# Patient Record
Sex: Male | Born: 1940 | Race: White | Hispanic: No | Marital: Married | Smoking: Never smoker
Health system: Southern US, Community
[De-identification: ages and names within clinical notes are randomized; demographics above are authoritative.]

## PROBLEM LIST (undated history)

## (undated) DIAGNOSIS — C099 Malignant neoplasm of tonsil, unspecified: Secondary | ICD-10-CM

## (undated) DIAGNOSIS — I10 Essential (primary) hypertension: Secondary | ICD-10-CM

## (undated) DIAGNOSIS — B009 Herpesviral infection, unspecified: Secondary | ICD-10-CM

## (undated) DIAGNOSIS — J189 Pneumonia, unspecified organism: Secondary | ICD-10-CM

## (undated) DIAGNOSIS — H9190 Unspecified hearing loss, unspecified ear: Secondary | ICD-10-CM

## (undated) DIAGNOSIS — E7800 Pure hypercholesterolemia, unspecified: Secondary | ICD-10-CM

## (undated) DIAGNOSIS — N529 Male erectile dysfunction, unspecified: Secondary | ICD-10-CM

## (undated) DIAGNOSIS — K219 Gastro-esophageal reflux disease without esophagitis: Secondary | ICD-10-CM

## (undated) DIAGNOSIS — IMO0002 Reserved for concepts with insufficient information to code with codable children: Secondary | ICD-10-CM

## (undated) DIAGNOSIS — IMO0001 Reserved for inherently not codable concepts without codable children: Secondary | ICD-10-CM

## (undated) HISTORY — DX: Herpesviral infection, unspecified: B00.9

## (undated) HISTORY — DX: Unspecified hearing loss, unspecified ear: H91.90

## (undated) HISTORY — DX: Essential (primary) hypertension: I10

## (undated) HISTORY — DX: Male erectile dysfunction, unspecified: N52.9

## (undated) HISTORY — DX: Pure hypercholesterolemia, unspecified: E78.00

## (undated) HISTORY — DX: Pneumonia, unspecified organism: J18.9

## (undated) HISTORY — DX: Malignant neoplasm of tonsil, unspecified: C09.9

## (undated) HISTORY — PX: OTHER SURGICAL HISTORY: SHX169

---

## 2007-04-09 ENCOUNTER — Ambulatory Visit (HOSPITAL_COMMUNITY): Admission: RE | Admit: 2007-04-09 | Discharge: 2007-04-09 | Payer: Self-pay | Admitting: Family Medicine

## 2009-01-18 ENCOUNTER — Emergency Department (HOSPITAL_COMMUNITY): Admission: EM | Admit: 2009-01-18 | Discharge: 2009-01-18 | Payer: Self-pay | Admitting: Emergency Medicine

## 2010-12-06 NOTE — Consult Note (Signed)
NAMEMarland Kitchen  LYON, DUMONT              ACCOUNT NO.:  [id number]   MEDICAL RECORD NO.:  [id number]          PATIENT TYPE:  EMS   LOCATION:  ED                            FACILITY:  [facility]   PHYSICIAN:  J. Darreld Mclean, M.D. DATE OF BIRTH:  [birth year]   DATE OF CONSULTATION:  DATE OF DISCHARGE:                                 CONSULTATION   The patient was seen at the request of the ER physician.   The patient is a 70 year old male who slipped and fell tonight while  working near his fish [place].  He landed on his left elbow.  He has got a  posterior dislocation without fracture of the left elbow.  Neurologically intact.  No other injuries.  No head injury.  No shoulder  injury.  No hip injury.  No back injury.  He has had good neurovascular  status to his arm but is very painful.  He has been seen by the ER  physician.  Please refer to the notes already taken by the nurse and the  ER physician here incorporated by reference, I have reviewed them.  His  vital signs are stable.   He has got obvious deformity to his left elbow.   The ER physician gave the patient etomidate, a short-acting anesthetic  agent IV.  Once the patient had the anesthetic agent on board and was  sedated, I then did a general closed reduction of his left elbow.  He is  put in a posterior splint.  I am awaiting x-rays now.  I have talked to  the patient's daughter who was present throughout the whole procedure.  I have recommended that he use ice to the elbow, given a prescription  for Vicodin, 5 samples would be given to go home tonight with him, and I  will see him in the office tomorrow afternoon.  Return to the emergency  room if any problem whatsoever.  I have gone over strict neurovascular  precautions with the daughter.  If any problem whatsoever, of his elbow,  hand, arm, back, or any other part of his body, he is to return to the  emergency room early tonight or early in the morning.  Otherwise, we  will see  him tomorrow afternoon.  Call if any difficulty healing.           ______________________________  J. Darreld Mclean, M.D.     JWK/MEDQ  D:  01/18/2009  T:  01/19/2009  Job:  [id number]

## 2011-10-25 ENCOUNTER — Other Ambulatory Visit: Payer: Self-pay | Admitting: Family Medicine

## 2011-10-25 DIAGNOSIS — K1370 Unspecified lesions of oral mucosa: Secondary | ICD-10-CM

## 2011-10-27 ENCOUNTER — Ambulatory Visit (HOSPITAL_COMMUNITY)
Admission: RE | Admit: 2011-10-27 | Discharge: 2011-10-27 | Disposition: A | Discharge status: Home / Self Care | Payer: Federal, State, Local not specified - PPO | Source: Ambulatory Visit | Attending: Family Medicine | Admitting: Family Medicine

## 2011-10-27 DIAGNOSIS — J392 Other diseases of pharynx: Secondary | ICD-10-CM | POA: Insufficient documentation

## 2011-10-27 DIAGNOSIS — M47812 Spondylosis without myelopathy or radiculopathy, cervical region: Secondary | ICD-10-CM | POA: Insufficient documentation

## 2011-10-27 DIAGNOSIS — C770 Secondary and unspecified malignant neoplasm of lymph nodes of head, face and neck: Secondary | ICD-10-CM | POA: Insufficient documentation

## 2011-10-27 DIAGNOSIS — K1370 Unspecified lesions of oral mucosa: Secondary | ICD-10-CM

## 2011-10-27 DIAGNOSIS — H68109 Unspecified obstruction of Eustachian tube, unspecified ear: Secondary | ICD-10-CM | POA: Insufficient documentation

## 2011-10-27 MED ORDER — GADOBENATE DIMEGLUMINE 529 MG/ML IV SOLN
15.0000 mL | Freq: Once | INTRAVENOUS | Status: AC | PRN
  Administered 2011-10-27: 15 mL via INTRAVENOUS

## 2011-11-02 ENCOUNTER — Ambulatory Visit (INDEPENDENT_AMBULATORY_CARE_PROVIDER_SITE_OTHER): Payer: Federal, State, Local not specified - PPO | Admitting: Otolaryngology

## 2011-11-16 ENCOUNTER — Encounter (HOSPITAL_COMMUNITY): Payer: Self-pay

## 2011-11-16 ENCOUNTER — Encounter (HOSPITAL_COMMUNITY): Payer: Federal, State, Local not specified - PPO | Attending: Oncology

## 2011-11-16 VITALS — BP 133/84 | HR 86 | Temp 98.0°F | Ht 67.0 in | Wt 162.2 lb

## 2011-11-16 DIAGNOSIS — E7800 Pure hypercholesterolemia, unspecified: Secondary | ICD-10-CM | POA: Insufficient documentation

## 2011-11-16 DIAGNOSIS — I10 Essential (primary) hypertension: Secondary | ICD-10-CM | POA: Insufficient documentation

## 2011-11-16 DIAGNOSIS — C099 Malignant neoplasm of tonsil, unspecified: Secondary | ICD-10-CM | POA: Insufficient documentation

## 2011-11-16 HISTORY — DX: Malignant neoplasm of tonsil, unspecified: C09.9

## 2011-11-16 MED ORDER — DEXAMETHASONE 4 MG PO TABS: ORAL_TABLET | ORAL | Status: DC

## 2011-11-16 MED ORDER — ONDANSETRON HCL 8 MG PO TABS: ORAL_TABLET | ORAL | Status: DC

## 2011-11-16 MED ORDER — PROCHLORPERAZINE MALEATE 10 MG PO TABS: 10.0000 mg | ORAL_TABLET | Freq: Four times a day (QID) | ORAL | Status: DC | PRN

## 2011-11-16 NOTE — Progress Notes (Signed)
CC:   Scott A. Gerda Diss, MD Newman Pies, MD  REFERRING PHYSICIAN:  Scott A. Gerda Diss, MD  IDENTIFYING STATEMENT:  Patient is a 71 year old man seen at request of Dr. Gerda Diss a head and neck tumor.  HISTORY OF PRESENT ILLNESS:  The patient presented to medical attention with a persistent sore throat that would not go away for 2-3 months' duration.  As he reports coughing mucus with blood, he presented to Dr. Gerda Diss and was placed on a course of antibiotics.  The discomfort got better but his symptoms of cough persisted.  On exam, the patient was also noted to have bilateral neck adenopathy.  He has also noted a fullness, especially in his left ear, and has noted somewhat limited hearing.  He presently denies pain.  He reports an intentional weight loss of 20 pounds over 5 months as a result of intensive exercise.  He denies shortness of breath.  He denies chest pain.  Dr. [name] had noted a right oropharyngeal mass.  The patient was subsequently referred for an MRI of the neck on 10/27/2011.  The MRI showed a 4.3 x 2.7 cm mass in the area of the tonsil extending superiorly into the right nasopharynx and crossing the midline to the left.  There was fluid in the right mastoid sinus due to obstruction of the eustachian tube. There was extensive cervical adenopathy with large necrotic nodes noted bilaterally measuring 2.5 x 1.8 cm on the right and 2.3 x 2.5 cm on left.  Multiple right jugular lymph nodes were also present.  Right retropharyngeal nodes were also involved with tumor.  The patient was referred to Dr. Newman Pies who performed a flexible fiberoptic laryngoscopy on 11/02/2011.  A large right tonsillar mass was noted eroding the soft palate and a large 2-3 cm mass was noted bilaterally at level 2.  Right middle ear effusion was noted secondary to right eustachian tube obstruction.  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  The patient is here with his wife and daughter, who is a Theatre stage manager, to discuss treatment options.  PAST MEDICAL HISTORY: 1. Hypertension. 2. Hypercholesterolemia.  ALLERGIES:  None.  MEDICATIONS:  Lipitor, lisinopril 40 mg daily, verapamil 100 mg q.h.s.  SOCIAL HISTORY:  The patient is married with 7 children and lives in [place].  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  He denies history of tobacco or alcohol use.  FAMILY HISTORY:  He denies a family history for oncologic or hematologic malignancies.  REVIEW OF SYSTEMS:  Denies fever, chills, night sweats, anorexia, but has had an intentional weight loss of 50 pounds over 4 months.  The patient states that this is a result of exercise.  He denies pain but does have some discomfort in his right neck area.  GI:  Denies nausea, vomiting, abdominal pain, diarrhea, melena, hematochezia.  GU:  Denies dysuria, hematuria, nocturia, frequency.  Cardiovascular:  Denies chest pain, PND, orthopnea, ankle swelling.  Respirations:  Denies cough, hemoptysis, wheeze, shortness of breath.  Skin:  No bruising or bleeding.  Neurologic:  Denies headache, vision changes, extremity weakness.  Rest of review of systems negative.  PHYSICAL EXAM:  General:  The patient is a well-appearing, well- nourished, man in no current distress.  Vitals:  Pulse 86, blood pressure 133/84, temperature 98, respirations 16, weight 162 pounds. HEENT:  Head is atraumatic, normocephalic.  Sclerae is anicteric. Pupils equal,  round, reactive to light.  Mouth moist without ulcerations.  There was a noted mass in the right oropharynx area of the tonsil with superficial ulceration extending to the midline.  Chest: Clear to percussion and auscultation.  CVS:  First and second heart sounds present with no added sounds or murmurs.  Abdomen:  Soft, nontender.  No hepatomegaly.  Bowel sounds present.   Extremities:  No calf tenderness.  Pulses present and symmetrical.  Lymph nodes: Palpable bilateral cervical adenopathy with an enlarged mass in the lateral area that was nontender and hard.  CNS:  No focal deficit. Plantars downgoing.  IMPRESSION AND PLAN:  Mr. 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  The patient will complete staging workup with a CT of the chest and a PET scan to get baseline.  He will also complete some baseline blood work with repeat calcium level.  We spent more than half the time discussing treatment options.  Despite his age, he has excellent performance status and walks 4 miles daily.  He has a passion for horses which is a hobby.  I recommended treating with a platin-based combination regimen with cisplatin dosed at 100 mg/sq m day 1 and 5-FU continuous infusion dosed at a 1000 mg/sq m daily from day 1- 4 with cetuximab given weekly.  The patient will also receive Neulasta support. We spent some time discussing logistics and side effects of therapy which were primarily cisplatin-induced nephrotoxicity and ototoxicity, thus a baseline audiogram is required prior to therapy.  Both drugs cause alopecia, nausea, vomiting, hand-foot syndrome, myelosuppression, mucositis, fatigue,and chest pain.  We also discussed EGFR-induced skin rash and diarrhea.  The patient was told that if he developed toxicity, I would consider lowering dose versus switching him to a less toxic regimen with carboplatin.  The patient will require port placement and thus he was referred to Surgery. The patient will also attend chemotherapy class and he was also given literature to review.  Treatment will potentially begin on Nov 27, 2011. Prescriptions were filled.  The patient had questions which were all answered.    ______________________________ Laurice Record, M.D. LIO/MEDQ  D:  11/16/2011  T:   11/16/2011  Job:  [id number]

## 2011-11-16 NOTE — Patient Instructions (Addendum)
Austin Mathis  [id number] 01-Jun-1941 Dr. Glenford Peers   [facility]  Discharge Instructions  RECOMMENDATIONS MADE BY THE CONSULTANT AND ANY TEST RESULTS WILL BE SENT TO YOUR REFERRING DOCTOR.   You were seen today by Dr. Dalene Carrow.  For your reference, with subsequent visits to the [facility] you will be seen by Dr. Mariel Sleet or Jenita Seashore, PA-C.  Patient to follow up as instructed: 1.  You are scheduled for Chemotherapy Teaching on May 1st at 0830 am.  Also on May 1st, you are scheduled to see Dr. Malvin Johns at 1030 am ([address], [place]). 2.  You will be receiving appointments for PET and CT scans. 3.  Your chemotherapy schedule is to follow.   I acknowledge that I have been informed and understand all the instructions given to me and received a copy. I do not have any more questions at this time, but understand that I may call the Specialty Clinic at [facility] at [phone] during business hours should I have any further questions or need assistance in obtaining follow-up care.    __________________________________________  _____________  __________ Signature of Patient or Authorized Representative            Date                   Time    __________________________________________ Nurse's Signature    Current Outpatient Prescriptions  Medication Sig Dispense Refill  . Atorvastatin Calcium (LIPITOR PO) Take by mouth.      Marland Kitchen lisinopril (PRINIVIL,ZESTRIL) 40 MG tablet Take 40 mg by mouth daily.      . verapamil (CALAN) 120 MG tablet Take 120 mg by mouth at bedtime.            April 2013  Sunday Monday Tuesday Wednesday Thursday Friday Saturday      1   2   3   4   5    MR MRA HEAD/ST NECK WO/W CM   3:00 PM  (45 min.)  [facility] 1  [facility] 6    7   8   9   10   11    NEW PATIENT 10   4:00 PM  (10 min.)  Darletta Moll, MD  DR. SU WOOI TEOH 12   13    14   15   16   17    18   19   20    21   22   23   24   25    NEW PATIENT 60  12:30 PM  (60 min.)  [facility] Covering Provider  [facility] 26   27    28   29   21 Dec 2011  Sunday Monday Tuesday Wednesday Thursday Friday Saturday            1   2   3   4    5   6   7   8   9   10   11    12   13   14   15   16   17   18    19   20   21   22   23   24   25    26   27    28  29 Happy Birthday!   30   31

## 2011-11-16 NOTE — Progress Notes (Signed)
This office note has been dictated.

## 2011-11-18 ENCOUNTER — Encounter (HOSPITAL_COMMUNITY): Payer: Self-pay | Admitting: *Deleted

## 2011-11-18 NOTE — Progress Notes (Signed)
Opened chart to review treatment plan and pre meds.

## 2011-11-20 ENCOUNTER — Ambulatory Visit (HOSPITAL_COMMUNITY)
Admission: RE | Admit: 2011-11-20 | Discharge: 2011-11-20 | Disposition: A | Discharge status: Home / Self Care | Payer: Federal, State, Local not specified - PPO | Source: Ambulatory Visit | Attending: Hematology and Oncology | Admitting: Hematology and Oncology

## 2011-11-20 DIAGNOSIS — R042 Hemoptysis: Secondary | ICD-10-CM | POA: Insufficient documentation

## 2011-11-20 DIAGNOSIS — R634 Abnormal weight loss: Secondary | ICD-10-CM | POA: Insufficient documentation

## 2011-11-20 DIAGNOSIS — C099 Malignant neoplasm of tonsil, unspecified: Secondary | ICD-10-CM | POA: Insufficient documentation

## 2011-11-20 MED ORDER — IOHEXOL 300 MG/ML  SOLN
80.0000 mL | Freq: Once | INTRAMUSCULAR | Status: AC | PRN
  Administered 2011-11-20: 80 mL via INTRAVENOUS

## 2011-11-20 NOTE — Patient Instructions (Addendum)
[facility]   CHEMOTHERAPY INSTRUCTIONS   POTENTIAL SIDE EFFECTS OF TREATMENT: Increased Susceptibility to Infection, Vomiting, Constipation, Hair Thinning, Changes in Character of Skin and Nails (brittleness, dryness,etc.), Pigment Changes (darkening of veins, nail beds, palms of hands, soles of feet, etc.), Bone Marrow Suppression, Abdominal Cramping and Urinary Frequency SPECIFIC CHEMO DRUG INFORMATION  Cisplatin - this medication is hard your kidneys. This is why we give you a large amount of fluids through your IV while you are here getting chemo. We also need you drinking 64 oz of fluid (preferably water/decaff fluids) 2 days prior to chemo and for up to 4-5 days after chemo. Drink more if you can. This will help keep your kidneys flushed. This can also cause acute and/or delayed nausea/vomiting. You must take your nausea meds as prescribed if you get nauseated. Do not wait until you start vomiting. This med can also cause peripheral neuropathy (numbness/tingling/burning in hands/fingers/feet/toes). Let us know if this develops so that we can monitor it and treat if necessary.  5FU: bone marrow suppression (low white blood cells - wbcs fight infection, low red blood cells - rbcs make up your blood, low platelets - this is what makes your blood clot, nausea/vomiting, diarrhea, mouth sores, hair loss, dry skin, ocular toxicities (increased tear production, sensitivity to light). You must wear sunscreen/sunglasses. Cover your skin when out in sunlight. You will get burned very easily.   Continuous Home Infusion Pump-- the 5 fu will be delivered over 96 hrs. Your treatment plan includes an infusion that will need to continue after you go home. We will hook you up to a small portable pump that fits into a "fanny pack". This pump will be set to infuse over a period of time specified by your doctor's orders. It can be anywhere from 24 to 96 hrs. You will be given a date  and time to return to our office to have this pump removed. You will be given an emergency contact # that you can call if the pump beeps or alarms.  The medication in this bag is a chemotherapy and should be handled with care. Do not allow others to handle this medication. You will be given a spill kit and instructed on how to use this in case of a spill.   Erbitux - this is a monoclonal antibody. This medication is derived partially from a mouse protein. Side Effects: infusion related reactions may include bronchospasm, fever, chills, shaking chills, itching, swelling around heart/blood vessels, low blood pressure, lung toxicity, acneform rash. It can also cause dry skin, fatigue, muscle aches, diarrhea, vomiting, no appetite, low white blood cell count, low magnesium, weight loss. You will also get a shot called neulasta after your chemo is finished. This shot is to help keep your white blood cells up to help you fight off infection. The shot may make your bones ache. You may use advil or aleve for this pain.  SELF IMAGE NEEDS AND REFERRALS MADE: We have a dietician available for nutritional referral. Chaplain available for spiritual concerns   EDUCATIONAL MATERIALS GIVEN AND REVIEWED: Specific Instructions Sheets for cisplatin, erbitux, and 5Fu   SELF CARE ACTIVITIES WHILE ON CHEMOTHERAPY: Increase your fluid intake 48 hours prior to treatment and drink at least 2 quarts per day after treatment., No alcohol intake., No aspirin or other medications unless approved by your oncologist., Eat foods that are light and easy to digest., Eat foods at cold or room temperature., No fried,  fatty, or spicy foods immediately before or after treatment., Have teeth cleaned professionally before starting treatment. Keep dentures and partial plates clean., Use soft toothbrush and do not use mouthwashes that contain alcohol. Biotene is a good mouthwash that is available at most pharmacies or may be ordered by calling  [phone]., Use warm salt water gargles (1 teaspoon salt per 1 quart warm water) before and after meals and at bedtime. Or you may rinse with 2 tablespoons of three -percent hydrogen peroxide mixed in eight ounces of water., Always use sunscreen with SPF (Sun Protection Factor) of 30 or higher., Use your nausea medication as directed to prevent nausea., Use your stool softener or laxative as directed to prevent constipation. and Use your anti-diarrheal medication as directed to stop diarrhea. Chemo Tips Please wash your hands for at least 30 seconds using warm soapy water. Handwashing is the #1 way to prevent the spread of germs. Stay away from sick people or people who are getting over a cold. If you develop respiratory systems such as green/yellow mucus production or productive cough or persistent cough let us know and we will see if you need an antibiotic. It is a good idea to keep a pair of gloves on when going into grocery stores/[employer] to decrease your risk of coming into contact with germs on the carts, etc. Carry alcohol hand gel with you at all times and use it frequently if out in public. All foods need to be cooked thoroughly. No raw foods. No medium or undercooked meats, eggs. If your food is cooked medium well, it does not need to be hot pink or saturated with bloody liquid at all. Vegetables and fruits need to be washed/rinsed under the faucet with a dish detergent before being consumed. You can eat raw fruits and vegetables unless we tell you otherwise but it would be best if you cooked them or bought frozen. Do not eat off of salad bars or hot bars unless you really trust the cleanliness of the restaurant. If you need dental work, please let Dr. Mariel Sleet know before you go for your appointment so that we can coordinate the best possible time for you in regards to your chemo regimen. You need to also let your dentist know that you are actively taking chemo. We may need to do labs prior to  your dental appointment. We also want your bowels moving at least every other day. If this is not happening, we need to know so that we can get you on a bowel regimen to help you go.      MEDICATIONS: You have been given prescriptions for the following medications: Dexamethasone -Take 2 tablets by mouth once a day on the day after chemotherapy and then take 2 tablets two times a day for 2 days. Take with food. Zofran or ondansetron  Take 1 tablet two times a day as needed for nausea or vomiting starting on the third day after chemotherapy. Compazine or prochlorperazine Take 1 tablet (10 mg total) by mouth every 6 (six) hours as needed (Nausea or vomiting).  Emla cream or lidocaine cream apply to port site one hour prior to access.  Follow directions on label   SYMPTOMS TO REPORT AS SOON AS POSSIBLE AFTER TREATMENT:  FEVER GREATER THAN 100.5 F  CHILLS WITH OR WITHOUT FEVER  NAUSEA AND VOMITING THAT IS NOT CONTROLLED WITH YOUR NAUSEA MEDICATION  UNUSUAL SHORTNESS OF BREATH  UNUSUAL BRUISING OR BLEEDING  TENDERNESS IN MOUTH AND THROAT WITH OR  WITHOUT PRESENCE OF ULCERS  URINARY PROBLEMS  BOWEL PROBLEMS  UNUSUAL RASH    Wear comfortable clothing and clothing appropriate for easy access to any Portacath or PICC line. Let us know if there is anything that we can do to make your therapy better!      I have been informed and understand all of the instructions given to me and have received a copy. I have been instructed to call the clinic [phone] or my family physician as soon as possible for continued medical care, if indicated. I do not have any more questions at this time but understand that I may call the Cancer Center or the Patient Navigator at [phone] during office hours should I have questions or need assistance in obtaining follow-up care.      _________________________________________      _______________     __________ Signature of Patient or  Authorized Representative        Date                            Time      _________________________________________ Nurse's Signature

## 2011-11-21 ENCOUNTER — Encounter (HOSPITAL_COMMUNITY): Payer: Self-pay | Admitting: Pharmacy Technician

## 2011-11-22 ENCOUNTER — Encounter (HOSPITAL_COMMUNITY): Payer: Federal, State, Local not specified - PPO | Attending: Oncology

## 2011-11-22 ENCOUNTER — Encounter (HOSPITAL_COMMUNITY): Payer: Self-pay

## 2011-11-22 ENCOUNTER — Encounter (HOSPITAL_COMMUNITY)
Admission: RE | Admit: 2011-11-22 | Discharge: 2011-11-22 | Disposition: A | Discharge status: Home / Self Care | Payer: Federal, State, Local not specified - PPO | Source: Ambulatory Visit | Attending: General Surgery | Admitting: General Surgery

## 2011-11-22 DIAGNOSIS — C099 Malignant neoplasm of tonsil, unspecified: Secondary | ICD-10-CM

## 2011-11-22 LAB — DIFFERENTIAL
Basophils Relative: 0 % (ref 0–1)
Eosinophils Absolute: 0.2 10*3/uL (ref 0.0–0.7)
Eosinophils Relative: 2 % (ref 0–5)
Monocytes Absolute: 0.6 10*3/uL (ref 0.1–1.0)
Monocytes Relative: 6 % (ref 3–12)

## 2011-11-22 LAB — SURGICAL PCR SCREEN
MRSA, PCR: NEGATIVE
Staphylococcus aureus: NEGATIVE

## 2011-11-22 LAB — CBC
HCT: 42.7 % (ref 39.0–52.0)
Hemoglobin: 14.4 g/dL (ref 13.0–17.0)
MCH: 31.4 pg (ref 26.0–34.0)
MCHC: 33.7 g/dL (ref 30.0–36.0)
MCV: 93.2 fL (ref 78.0–100.0)

## 2011-11-22 LAB — MAGNESIUM: Magnesium: 2.2 mg/dL (ref 1.5–2.5)

## 2011-11-22 LAB — COMPREHENSIVE METABOLIC PANEL
AST: 13 U/L (ref 0–37)
Albumin: 3.9 g/dL (ref 3.5–5.2)
Alkaline Phosphatase: 73 U/L (ref 39–117)
BUN: 13 mg/dL (ref 6–23)
Chloride: 96 mEq/L (ref 96–112)
Potassium: 4.3 mEq/L (ref 3.5–5.1)
Total Bilirubin: 0.3 mg/dL (ref 0.3–1.2)

## 2011-11-22 NOTE — Progress Notes (Signed)
11/22/11 1401  OBSTRUCTIVE SLEEP APNEA  Have you ever been diagnosed with sleep apnea through a sleep study? No  Do you snore loudly (loud enough to be heard through closed doors)?  1  Do you often feel tired, fatigued, or sleepy during the daytime? 0  Has anyone observed you stop breathing during your sleep? 0  Do you have, or are you being treated for high blood pressure? 1  BMI more than 35 kg/m2? 0  Age over 73 years old? 1  Neck circumference greater than 40 cm/18 inches? 0  Gender: 1  Obstructive Sleep Apnea Score 4   Score 4 or greater  Updated health history

## 2011-11-22 NOTE — Patient Instructions (Signed)
20 Austin Mathis  11/22/2011   Your procedure is scheduled on:  Friday, 11/24/11  Report to [facility] at 0700 AM.  Call this number if you have problems the morning of surgery: [phone]   Remember:   Do not eat food:After Midnight.  May have clear liquids:until Midnight .  Clear liquids include soda, tea, black coffee, apple or grape juice, broth.  Take these medicines the morning of surgery with A SIP OF WATER: verapamil, lisinopril and zofran   Do not wear jewelry, make-up or nail polish.  Do not wear lotions, powders, or perfumes. You may wear deodorant.  Do not shave 48 hours prior to surgery.  Do not bring valuables to the hospital.  Contacts, dentures or bridgework may not be worn into surgery.  Leave suitcase in the car. After surgery it may be brought to your room.  For patients admitted to the hospital, checkout time is 11:00 AM the day of discharge.   Patients discharged the day of surgery will not be allowed to drive home.  Name and phone number of your driver:wife  Special Instructions: CHG Shower Use Special Wash: 1/2 bottle night before surgery and 1/2 bottle morning of surgery.   Please read over the following fact sheets that you were given: Pain Booklet, MRSA Information, Surgical Site Infection Prevention, Anesthesia Post-op Instructions and Care and Recovery After Surgery   Implanted Port Instructions An implanted port is a central line that has a round shape and is placed under the skin. It is used for long-term IV (intravenous) access for:  Medicine.   Fluids.   Liquid nutrition, such as TPN (total parenteral nutrition).   Blood samples.  Ports can be placed:  In the chest area just below the collarbone (this is the most common place.)   In the arms.   In the belly (abdomen) area.   In the legs.  PARTS OF THE PORT A port has 2 main parts:  The reservoir. The reservoir is round, disc-shaped, and will be a small, raised area under your skin.   The  reservoir is the part where a needle is inserted (accessed) to either give medicines or to draw blood.   The catheter. The catheter is a long, slender tube that extends from the reservoir. The catheter is placed into a large vein.   Medicine that is inserted into the reservoir goes into the catheter and then into the vein.  INSERTION OF THE PORT  The port is surgically placed in either an operating room or in a procedural area (interventional radiology).   Medicine may be given to help you relax during the procedure.   The skin where the port will be inserted is numbed (local anesthetic).   1 or 2 small cuts (incisions) will be made in the skin to insert the port.   The port can be used after it has been inserted.  INCISION SITE CARE  The incision site may have small adhesive strips on it. This helps keep the incision site closed. Sometimes, no adhesive strips are placed. Instead of adhesive strips, a special kind of surgical glue is used to keep the incision closed.   If adhesive strips were placed on the incision sites, do not take them off. They will fall off on their own.   The incision site may be sore for 1 to 2 days. Pain medicine can help.   Do not get the incision site wet. Bathe or shower as directed by your caregiver.  The incision site should heal in 5 to 7 days. A small scar may form after the incision has healed.  ACCESSING THE PORT Special steps must be taken to access the port:  Before the port is accessed, a numbing cream can be placed on the skin. This helps numb the skin over the port site.   A sterile technique is used to access the port.   The port is accessed with a needle. Only "non-coring" port needles should be used to access the port. Once the port is accessed, a blood return should be checked. This helps ensure the port is in the vein and is not clogged (clotted).   If your caregiver believes your port should remain accessed, a clear (transparent)  bandage will be placed over the needle site. The bandage and needle will need to be changed every week or as directed by your caregiver.   Keep the bandage covering the needle clean and dry. Do not get it wet. Follow your caregiver's instructions on how to take a shower or bath when the port is accessed.   If your port does not need to stay accessed, no bandage is needed over the port.  FLUSHING THE PORT Flushing the port keeps it from getting clogged. How often the port is flushed depends on:  If a constant infusion is running. If a constant infusion is running, the port may not need to be flushed.   If intermittent medicines are given.   If the port is not being used.  For intermittent medicines:  The port will need to be flushed:   After medicines have been given.   After blood has been drawn.   As part of routine maintenance.   A port is normally flushed with:   Normal saline.   Heparin.   Follow your caregiver's advice on how often, how much, and the type of flush to use on your port.  IMPORTANT PORT INFORMATION  Tell your caregiver if you are allergic to heparin.   After your port is placed, you will get a manufacturer's information card. The card has information about your port. Keep this card with you at all times.   There are many types of ports available. Know what kind of port you have.   In case of an emergency, it may be helpful to wear a medical alert bracelet. This can help alert health care workers that you have a port.   The port can stay in for as long as your caregiver believes it is necessary.   When it is time for the port to come out, surgery will be done to remove it. The surgery will be similar to how the port was put in.   If you are in the hospital or clinic:   Your port will be taken care of and flushed by a nurse.   If you are at home:   A home health care nurse may give medicines and take care of the port.   You or a family member can  get special training and directions for giving medicine and taking care of the port at home.  SEEK IMMEDIATE MEDICAL CARE IF:   Your port does not flush or you are unable to get a blood return.   New drainage or pus is coming from the incision.   A bad smell is coming from the incision site.   You develop swelling or increased redness at the incision site.   You develop increased swelling  or pain at the port site.   You develop swelling or pain in the surrounding skin near the port.   You have an oral temperature above 102 F (38.9 C), not controlled by medicine.  MAKE SURE YOU:   Understand these instructions.   Will watch your condition.   Will get help right away if you are not doing well or get worse.  Document Released: 07/10/2005 Document Revised: 06/29/2011 Document Reviewed: 10/01/2008 [facility] Patient Information 2012 [place], [place].

## 2011-11-22 NOTE — Progress Notes (Signed)
Chemo teaching done, consents signed and nausea meds called to [facility].

## 2011-11-22 NOTE — Consult Note (Signed)
NAMECULLEN, Austin Mathis NO.:  [id number]  MEDICAL RECORD NO.:  [id number]  LOCATION:  DOIB                          FACILITY:  [facility]  PHYSICIAN:  Barbaraann Barthel, M.D. DATE OF BIRTH:  1941/04/04  DATE OF CONSULTATION: DATE OF DISCHARGE:                                CONSULTATION   No dictation for this job.     Barbaraann Barthel, M.D.     WB/MEDQ  D:  11/22/2011  T:  11/22/2011  Job:  [id number]

## 2011-11-22 NOTE — Consult Note (Signed)
Austin Mathis, DEMPSTER NO.:  [id number]  MEDICAL RECORD NO.:  [id number]  LOCATION:                                FACILITY:  [facility]  PHYSICIAN:  Barbaraann Barthel, M.D. DATE OF BIRTH:  01/19/1941  DATE OF CONSULTATION:  11/22/2011 DATE OF DISCHARGE:                                CONSULTATION   DIAGNOSIS:  Squamous cell carcinoma of the tonsils.  NOTE:  This is a 71 year old [place] male who developed cancer of the tonsils.  Interestingly, he has never smoked and has never been a drinker and leads a very healthy lifestyle.  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 Surgery was consulted for placement of Port-A-Cath.  PAST MEDICAL HISTORY:  Hypertension.  PAST SURGICAL HISTORY:  He has had no previous surgery.  ALLERGIES:  He has no known allergies.  Please consult records for his meds.  PHYSICAL EXAMINATION:  VITAL SIGNS:  He is a pleasant 5 foot 7 Latin- American male who weighs 162 pounds.  His temperature is 97.6, pulse rate 62, respirations 12, blood pressure 124/76. GENERAL:  He actually looks younger than his age. HEAD:  Normocephalic. EYES:  Extraocular movements are intact.  Pupils are round, react to light and accommodation.  The patient has diminished hearing on his right side particularly.  He cannot open his mouth entirely, but he has an obvious malignancy on his right tonsil pillar and he has significant bilateral cervical adenopathy. CHEST:  Clear. HEART:  Regular rhythm. ABDOMEN:  Soft. RECTAL:  Deferred. EXTREMITIES:  Within normal limits.  REVIEW OF SYSTEMS:  No history of migraines or seizures.  ENDOCRINE:  No history of diabetes or thyroid disease.  CARDIOVASCULAR:  The patient is a nonsmoker, nondrinker.  Has a history of hypertension. MUSCULOSKELETAL:  Grossly within normal limits.  Past GI: Unremarkable. He  has no history of unexplained weight loss, and he has had no colonoscopy.  GU: No history of dysuria or frequency or kidney stones.  LABORATORY DATA:  Pending.  PLAN:  We will plan for placement of a Port-A-Cath via the outpatient department.  This was discussed in detail with him discussing complications not limited to, but including bleeding, infection, thrombosis, catheter embolization, and pneumothorax.  Informed consent was obtained.  We will hopefully be able do this, this Friday so that he can receive his 1st treatment soon.     Barbaraann Barthel, M.D.     WB/MEDQ  D:  11/22/2011  T:  11/22/2011  Job:  [id number]  cc:   Ladona Horns. Mariel Sleet, MD Fax: [phone]

## 2011-11-23 ENCOUNTER — Other Ambulatory Visit (HOSPITAL_COMMUNITY): Payer: Self-pay | Admitting: Hematology and Oncology

## 2011-11-23 ENCOUNTER — Encounter (HOSPITAL_COMMUNITY)
Admission: RE | Admit: 2011-11-23 | Discharge: 2011-11-23 | Disposition: A | Discharge status: Home / Self Care | Payer: Federal, State, Local not specified - PPO | Source: Ambulatory Visit | Attending: Hematology and Oncology | Admitting: Hematology and Oncology

## 2011-11-23 DIAGNOSIS — C099 Malignant neoplasm of tonsil, unspecified: Secondary | ICD-10-CM

## 2011-11-23 DIAGNOSIS — C770 Secondary and unspecified malignant neoplasm of lymph nodes of head, face and neck: Secondary | ICD-10-CM | POA: Insufficient documentation

## 2011-11-23 LAB — GLUCOSE, CAPILLARY: Glucose-Capillary: 102 mg/dL — ABNORMAL HIGH (ref 70–99)

## 2011-11-23 MED ORDER — FLUDEOXYGLUCOSE F - 18 (FDG) INJECTION
14.9000 | Freq: Once | INTRAVENOUS | Status: AC | PRN
  Administered 2011-11-23: 14.9 via INTRAVENOUS

## 2011-11-24 ENCOUNTER — Encounter (HOSPITAL_COMMUNITY): Payer: Self-pay | Admitting: Anesthesiology

## 2011-11-24 ENCOUNTER — Ambulatory Visit (HOSPITAL_COMMUNITY): Payer: Federal, State, Local not specified - PPO

## 2011-11-24 ENCOUNTER — Encounter (HOSPITAL_COMMUNITY): Payer: Self-pay | Admitting: *Deleted

## 2011-11-24 ENCOUNTER — Ambulatory Visit (HOSPITAL_COMMUNITY)
Admission: RE | Admit: 2011-11-24 | Discharge: 2011-11-24 | Disposition: A | Discharge status: Home / Self Care | Payer: Federal, State, Local not specified - PPO | Source: Ambulatory Visit | Attending: General Surgery | Admitting: General Surgery

## 2011-11-24 ENCOUNTER — Telehealth (HOSPITAL_COMMUNITY): Payer: Self-pay | Admitting: Oncology

## 2011-11-24 ENCOUNTER — Ambulatory Visit (HOSPITAL_COMMUNITY): Payer: Federal, State, Local not specified - PPO | Admitting: Anesthesiology

## 2011-11-24 ENCOUNTER — Encounter (HOSPITAL_COMMUNITY)
Admission: RE | Disposition: A | Discharge status: Home / Self Care | Payer: Self-pay | Source: Ambulatory Visit | Attending: General Surgery

## 2011-11-24 DIAGNOSIS — C770 Secondary and unspecified malignant neoplasm of lymph nodes of head, face and neck: Secondary | ICD-10-CM | POA: Insufficient documentation

## 2011-11-24 DIAGNOSIS — Z79899 Other long term (current) drug therapy: Secondary | ICD-10-CM | POA: Insufficient documentation

## 2011-11-24 DIAGNOSIS — E7800 Pure hypercholesterolemia, unspecified: Secondary | ICD-10-CM

## 2011-11-24 DIAGNOSIS — C099 Malignant neoplasm of tonsil, unspecified: Secondary | ICD-10-CM | POA: Insufficient documentation

## 2011-11-24 DIAGNOSIS — Z01812 Encounter for preprocedural laboratory examination: Secondary | ICD-10-CM | POA: Insufficient documentation

## 2011-11-24 DIAGNOSIS — Z01810 Encounter for preprocedural cardiovascular examination: Secondary | ICD-10-CM | POA: Insufficient documentation

## 2011-11-24 DIAGNOSIS — I10 Essential (primary) hypertension: Secondary | ICD-10-CM

## 2011-11-24 HISTORY — PX: PORTACATH PLACEMENT: SHX2246

## 2011-11-24 SURGERY — INSERTION, TUNNELED CENTRAL VENOUS DEVICE, WITH PORT
Anesthesia: Monitor Anesthesia Care | Site: Chest | Laterality: Left | Wound class: Clean

## 2011-11-24 MED ORDER — LIDOCAINE HCL (PF) 1 % IJ SOLN
INTRAMUSCULAR | Status: AC
  Filled 2011-11-24: qty 5

## 2011-11-24 MED ORDER — LIDOCAINE HCL (PF) 1 % IJ SOLN
INTRAMUSCULAR | Status: DC | PRN
  Administered 2011-11-24: 50 mg

## 2011-11-24 MED ORDER — PROPOFOL 10 MG/ML IV EMUL
INTRAVENOUS | Status: AC
  Filled 2011-11-24: qty 20

## 2011-11-24 MED ORDER — MIDAZOLAM HCL 2 MG/2ML IJ SOLN
INTRAMUSCULAR | Status: AC
  Administered 2011-11-24: 2 mg via INTRAVENOUS
  Filled 2011-11-24: qty 2

## 2011-11-24 MED ORDER — HEPARIN SODIUM (PORCINE) 1000 UNIT/ML IJ SOLN
INTRAMUSCULAR | Status: AC
  Filled 2011-11-24: qty 1

## 2011-11-24 MED ORDER — LACTATED RINGERS IV SOLN
INTRAVENOUS | Status: DC
  Administered 2011-11-24: 08:00:00 via INTRAVENOUS

## 2011-11-24 MED ORDER — HEPARIN SOD (PORK) LOCK FLUSH 100 UNIT/ML IV SOLN
INTRAVENOUS | Status: AC
  Filled 2011-11-24: qty 5

## 2011-11-24 MED ORDER — BACITRACIN ZINC 500 UNIT/GM EX OINT
TOPICAL_OINTMENT | CUTANEOUS | Status: AC
  Filled 2011-11-24: qty 1.8

## 2011-11-24 MED ORDER — CEFAZOLIN SODIUM 1-5 GM-% IV SOLN: 1.0000 g | INTRAVENOUS | Status: DC

## 2011-11-24 MED ORDER — CEFAZOLIN SODIUM 1-5 GM-% IV SOLN
INTRAVENOUS | Status: AC
  Filled 2011-11-24: qty 50

## 2011-11-24 MED ORDER — CEFAZOLIN SODIUM 1-5 GM-% IV SOLN
INTRAVENOUS | Status: DC | PRN
  Administered 2011-11-24: 1 g via INTRAVENOUS

## 2011-11-24 MED ORDER — HEPARIN SOD (PORK) LOCK FLUSH 100 UNIT/ML IV SOLN
INTRAVENOUS | Status: DC | PRN
  Administered 2011-11-24: 3 [IU] via INTRAVENOUS

## 2011-11-24 MED ORDER — FENTANYL CITRATE 0.05 MG/ML IJ SOLN
INTRAMUSCULAR | Status: AC
  Filled 2011-11-24: qty 2

## 2011-11-24 MED ORDER — HEPARIN SODIUM (PORCINE) 1000 UNIT/ML IJ SOLN
INTRAMUSCULAR | Status: DC | PRN
  Administered 2011-11-24: 1000 [IU] via INTRAVENOUS

## 2011-11-24 MED ORDER — LIDOCAINE HCL (PF) 1 % IJ SOLN
INTRAMUSCULAR | Status: DC | PRN
  Administered 2011-11-24: 8 mL

## 2011-11-24 MED ORDER — FENTANYL CITRATE 0.05 MG/ML IJ SOLN: 25.0000 ug | INTRAMUSCULAR | Status: DC | PRN

## 2011-11-24 MED ORDER — SODIUM CHLORIDE 0.9 % IJ SOLN
INTRAMUSCULAR | Status: DC | PRN
  Administered 2011-11-24: 500 mL via INTRAVENOUS

## 2011-11-24 MED ORDER — ACETAMINOPHEN 325 MG PO TABS: 325.0000 mg | ORAL_TABLET | ORAL | Status: DC | PRN

## 2011-11-24 MED ORDER — LIDOCAINE HCL (PF) 1 % IJ SOLN
INTRAMUSCULAR | Status: AC
  Filled 2011-11-24: qty 30

## 2011-11-24 MED ORDER — GLYCOPYRROLATE 0.2 MG/ML IJ SOLN
0.2000 mg | Freq: Once | INTRAMUSCULAR | Status: AC
  Administered 2011-11-24: 0.2 mg via INTRAVENOUS

## 2011-11-24 MED ORDER — ONDANSETRON HCL 4 MG/2ML IJ SOLN: 4.0000 mg | Freq: Once | INTRAMUSCULAR | Status: DC | PRN

## 2011-11-24 MED ORDER — FENTANYL CITRATE 0.05 MG/ML IJ SOLN
INTRAMUSCULAR | Status: DC | PRN
  Administered 2011-11-24 (×3): 25 ug via INTRAVENOUS

## 2011-11-24 MED ORDER — MIDAZOLAM HCL 2 MG/2ML IJ SOLN
1.0000 mg | INTRAMUSCULAR | Status: DC | PRN
  Administered 2011-11-24 (×2): 2 mg via INTRAVENOUS

## 2011-11-24 MED ORDER — PROPOFOL 10 MG/ML IV EMUL
INTRAVENOUS | Status: DC | PRN
  Administered 2011-11-24: 50 ug/kg/min via INTRAVENOUS

## 2011-11-24 MED ORDER — BACITRACIN-NEOMYCIN-POLYMYXIN 400-5-5000 EX OINT
TOPICAL_OINTMENT | CUTANEOUS | Status: DC | PRN
  Administered 2011-11-24: 2 via TOPICAL

## 2011-11-24 MED ORDER — LACTATED RINGERS IV SOLN
INTRAVENOUS | Status: DC | PRN
  Administered 2011-11-24: 08:00:00 via INTRAVENOUS

## 2011-11-24 MED ORDER — GLYCOPYRROLATE 0.2 MG/ML IJ SOLN
INTRAMUSCULAR | Status: AC
  Administered 2011-11-24: 0.2 mg via INTRAVENOUS
  Filled 2011-11-24: qty 1

## 2011-11-24 SURGICAL SUPPLY — 46 items
APPLIER CLIP 9.375 SM OPEN (CLIP)
APR CLP SM 9.3 20 MLT OPN (CLIP)
BAG DECANTER FOR FLEXI CONT (MISCELLANEOUS) ×2 IMPLANT
BAG HAMPER (MISCELLANEOUS) ×2 IMPLANT
CLIP APPLIE 9.375 SM OPEN (CLIP) IMPLANT
CLOTH BEACON ORANGE TIMEOUT ST (SAFETY) ×2 IMPLANT
COVER SURGICAL LIGHT HANDLE (MISCELLANEOUS) ×4 IMPLANT
DECANTER SPIKE VIAL GLASS SM (MISCELLANEOUS) ×2 IMPLANT
DRAPE C-ARM FOLDED MOBILE STRL (DRAPES) ×2 IMPLANT
DRSG TEGADERM 2-3/8X2-3/4 SM (GAUZE/BANDAGES/DRESSINGS) ×2 IMPLANT
ELECT REM PT RETURN 9FT ADLT (ELECTROSURGICAL) ×2
ELECTRODE REM PT RTRN 9FT ADLT (ELECTROSURGICAL) ×1 IMPLANT
GLOVE BIOGEL PI IND STRL 7.5 (GLOVE) IMPLANT
GLOVE BIOGEL PI INDICATOR 7.5 (GLOVE) ×1
GLOVE ECLIPSE 7.0 STRL STRAW (GLOVE) ×1 IMPLANT
GLOVE SKINSENSE NS SZ7.0 (GLOVE) ×1
GLOVE SKINSENSE STRL SZ7.0 (GLOVE) ×1 IMPLANT
GOWN STRL REIN XL XLG (GOWN DISPOSABLE) ×4 IMPLANT
IV NS 500ML (IV SOLUTION) ×2
IV NS 500ML BAXH (IV SOLUTION) ×1 IMPLANT
KIT PORT POWER 8FR ISP MRI (CATHETERS) ×2 IMPLANT
KIT ROOM TURNOVER APOR (KITS) ×2 IMPLANT
MANIFOLD NEPTUNE II (INSTRUMENTS) ×2 IMPLANT
NDL HYPO 18GX1.5 BLUNT FILL (NEEDLE) ×1 IMPLANT
NDL HYPO 25X1 1.5 SAFETY (NEEDLE) ×1 IMPLANT
NEEDLE HYPO 18GX1.5 BLUNT FILL (NEEDLE) ×2 IMPLANT
NEEDLE HYPO 25X1 1.5 SAFETY (NEEDLE) ×2 IMPLANT
PACK MINOR (CUSTOM PROCEDURE TRAY) ×2 IMPLANT
PAD ARMBOARD 7.5X6 YLW CONV (MISCELLANEOUS) ×2 IMPLANT
SET BASIN LINEN APH (SET/KITS/TRAYS/PACK) ×2 IMPLANT
SHEATH COOK PEEL AWAY SET 8F (SHEATH) IMPLANT
SOL PREP PROV IODINE SCRUB 4OZ (MISCELLANEOUS) ×2 IMPLANT
SPONGE GAUZE 2X2 8PLY STRL LF (GAUZE/BANDAGES/DRESSINGS) ×2 IMPLANT
SPONGE LAP 18X18 X RAY DECT (DISPOSABLE) ×1 IMPLANT
STRIP CLOSURE SKIN 1/2X4 (GAUZE/BANDAGES/DRESSINGS) ×1 IMPLANT
STRIP CLOSURE SKIN 1/4X3 (GAUZE/BANDAGES/DRESSINGS) ×2 IMPLANT
SUT VIC AB 4-0 SH 27 (SUTURE) ×4
SUT VIC AB 4-0 SH 27XBRD (SUTURE) ×1 IMPLANT
SUT VIC AB 5-0 P-3 18X BRD (SUTURE) ×1 IMPLANT
SUT VIC AB 5-0 P3 18 (SUTURE) ×2
SYR 20CC LL (SYRINGE) ×2 IMPLANT
SYR 5ML LL (SYRINGE) ×4 IMPLANT
SYR BULB IRRIGATION 50ML (SYRINGE) ×2 IMPLANT
SYR CONTROL 10ML LL (SYRINGE) ×2 IMPLANT
TOWEL OR 17X26 4PK STRL BLUE (TOWEL DISPOSABLE) ×2 IMPLANT
WATER STERILE IRR 1000ML POUR (IV SOLUTION) ×4 IMPLANT

## 2011-11-24 NOTE — Progress Notes (Signed)
Post OP Check  Wound clean and dry.    Filed Vitals:   11/24/11 1115  BP: 129/83  Pulse: 81  Temp:   Resp: 14  O2 Sat. 98%  Post op chest x-ray shows tip of catheter in SVC no pneumothorax.  Pt will follow up with oncology on Monday and with me later on some day.  Did well and discharged home.

## 2011-11-24 NOTE — Telephone Encounter (Signed)
[employer]- [phone] ? AUTH IS NEEDED FOR ERBITUX [id number]* 5FU [id number]* CISPLATIN 9060 AND NEULASTA 2505. CSR-TIM H NO AUTH IS REQUIRED FOR CHEMO DRUGS LISTED ABOVE. REF# [id number]

## 2011-11-24 NOTE — Anesthesia Preprocedure Evaluation (Signed)
Anesthesia Evaluation  Patient identified by MRN, date of birth, ID band Patient awake    Reviewed: Allergy & Precautions, H&P , NPO status , Patient's Chart, lab work & pertinent test results  Airway Mallampati: IV TM Distance: >3 FB Neck ROM: Limited  Mouth opening: Limited Mouth Opening  Dental  (+) Teeth Intact   Pulmonary sleep apnea ,    Pulmonary exam normal       Cardiovascular hypertension, Pt. on medications Rhythm:Regular Rate:Normal     Neuro/Psych negative neurological ROS  negative psych ROS   GI/Hepatic negative GI ROS, Neg liver ROS,   Endo/Other  negative endocrine ROS  Renal/GU negative Renal ROS     Musculoskeletal negative musculoskeletal ROS (+)   Abdominal Normal abdominal exam  (+)   Peds  Hematology negative hematology ROS (+)   Anesthesia Other Findings   Reproductive/Obstetrics                           Anesthesia Physical Anesthesia Plan  ASA: II  Anesthesia Plan: MAC   Post-op Pain Management:    Induction: Intravenous  Airway Management Planned: Simple Face Mask  Additional Equipment:   Intra-op Plan:   Post-operative Plan:   Informed Consent: I have reviewed the patients History and Physical, chart, labs and discussed the procedure including the risks, benefits and alternatives for the proposed anesthesia with the patient or authorized representative who has indicated his/her understanding and acceptance.     Plan Discussed with: CRNA  Anesthesia Plan Comments: (Moderate IV sedation)        Anesthesia Quick Evaluation

## 2011-11-24 NOTE — Anesthesia Postprocedure Evaluation (Signed)
  Anesthesia Post-op Note  Patient: Austin Mathis  Procedure(s) Performed: Procedure(s) (LRB): INSERTION PORT-A-CATH (Left)  Patient Location: PACU  Anesthesia Type: MAC  Level of Consciousness: awake, alert , oriented and patient cooperative  Airway and Oxygen Therapy: Patient Spontanous Breathing  Post-op Pain: Mild  Post-op Assessment: Post-op Vital signs reviewed, Patient's Cardiovascular Status Stable, Respiratory Function Stable and Patent Airway  Post-op Vital Signs: Reviewed and stable  Complications: No apparent anesthesia complications

## 2011-11-24 NOTE — Preoperative (Signed)
Beta Blockers   Reason not to administer Beta Blockers:Not Applicable 

## 2011-11-24 NOTE — Progress Notes (Signed)
70 yr. Old Latin American Male with CA of the left tonsil with mets to the cervical nodes, for placement of porta cath for chemotherapy.  Procedure and risks explained in Spanish and [place] and informed consent obtained.  Labs reviewed.  No change clinically in H&P since it was done.  Dict. # [id number].  Filed Vitals:   11/24/11 0925  BP: 157/91  Pulse:   Temp:   Resp: 14  temp 97.7 pulse 74/min.

## 2011-11-24 NOTE — Progress Notes (Signed)
Awake. Ginger-ale given to drink. Tolerated well. 

## 2011-11-24 NOTE — Brief Op Note (Signed)
11/24/2011  11:13 AM  PATIENT:  Austin Mathis  71 y.o. male  PRE-OPERATIVE DIAGNOSIS:  cancer tonsil  POST-OPERATIVE DIAGNOSIS:  cancer tonsil  PROCEDURE:  Procedure(s) (LRB): INSERTION PORT-A-CATH (Left)  SURGEON:  Surgeon(s) and Role:    * Marlane Hatcher, MD - Primary  PHYSICIAN ASSISTANT:   ASSISTANTS: none   ANESTHESIA:   IV sedation  EBL:  Total I/O In: 700 [I.V.:700] Out: 30 [Blood:30]  BLOOD ADMINISTERED:none  DRAINS: none   LOCAL MEDICATIONS USED:  LIDOCAINE 5 cc  SPECIMEN:  No Specimen  DISPOSITION OF SPECIMEN:  N/A  COUNTS:  YES  TOURNIQUET:  * No tourniquets in log *  DICTATION: .Other Dictation: Dictation Number OR dict.# [id number].  PLAN OF CARE: Discharge to home after PACU  PATIENT DISPOSITION:  PACU - hemodynamically stable.   Delay start of Pharmacological VTE agent (>24hrs) due to surgical blood loss or risk of bleeding: yes

## 2011-11-24 NOTE — Transfer of Care (Signed)
Immediate Anesthesia Transfer of Care Note  Patient: Austin Mathis  Procedure(s) Performed: Procedure(s) (LRB): INSERTION PORT-A-CATH (Left)  Patient Location: PACU  Anesthesia Type: MAC  Level of Consciousness: awake, alert , oriented and patient cooperative  Airway & Oxygen Therapy: Patient Spontanous Breathing  Post-op Assessment: Report given to PACU RN and Post -op Vital signs reviewed and stable  Post vital signs: Reviewed and stable  Complications: No apparent anesthesia complications

## 2011-11-24 NOTE — Op Note (Signed)
NAMEMarland Mathis  WHITTEN, ANDREONI NO.:  [id number]  MEDICAL RECORD NO.:  [id number]  LOCATION:  APPO                          FACILITY:  [facility]  PHYSICIAN:  Barbaraann Barthel, M.D. DATE OF BIRTH:  1940/11/25  DATE OF PROCEDURE:  11/24/2011 DATE OF DISCHARGE:  11/24/2011                              OPERATIVE REPORT   SURGEON:  Barbaraann Barthel, MD  DIAGNOSES:  Squamous cell carcinoma of the tonsil, metastatic.  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  PROCEDURE:  Placement of left subclavian Port-A-Cath under fluoro.  SPECIMEN:  None.  CASE CLASSIFICATION:  Clean.  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  He was referred for a placement of a Port-A- Cath.  We discussed complications in English and Spanish with the procedure discussing, not limited to bleeding, infection, thrombosis, possibility of catheter embolization, and pneumothorax.  Informed consent was obtained.  PROCEDURE:  The patient was placed in Trendelenburg after prepping with Betadine solution and draping in the usual manner.  It was difficult to cannulate the left subclavian vein because he had a clavicle that made very sharp angle back towards the shoulder.  However, we were able to cannulate this finally and under fluoro, this was confirmed in good position.  We used the Seldinger technique with a guidewire through an 18-gauge needle and then, we placed the Silastic catheter after the venous dilator catheter was placed.  The Silastic catheter was left in the superior vena cava.  Then, we connected it to its infusion device, which had been flushed with a heparinized saline solution, then aspirating it, and then flushing it again and confirming its position. We placed this in the subcutaneous pocket, which we created using 1% Xylocaine and then with blunt dissection, placed the infusion device in this pocket.  We  then closed the incision using 4-0 and 5-0 Polysorb and 0.25-inch Steri-Strips with bacitracin, 2x2s, and an OpSite dressing was applied.  Prior to closure, all sponge, needle, and instrument counts were found to be correct.  Estimated blood loss was maybe 30 mL.  The patient received about 500 mL.  No drains were placed.  There were no complications.  Note, we will verify the catheter position and chest x-ray to rule out pneumothorax in the recovery room.     Barbaraann Barthel, M.D.     WB/MEDQ  D:  11/24/2011  T:  11/24/2011  Job:  [id number]  cc:   Ladona Horns. Mariel Sleet, MD Fax: [phone]

## 2011-11-24 NOTE — Discharge Instructions (Signed)
Implanted Port Instructions An implanted port is a central line that has a round shape and is placed under the skin. It is used for long-term IV (intravenous) access for:  Medicine.   Fluids.   Liquid nutrition, such as TPN (total parenteral nutrition).   Blood samples.  Ports can be placed:  In the chest area just below the collarbone (this is the most common place.)   In the arms.   In the belly (abdomen) area.   In the legs.  PARTS OF THE PORT A port has 2 main parts:  The reservoir. The reservoir is round, disc-shaped, and will be a small, raised area under your skin.   The reservoir is the part where a needle is inserted (accessed) to either give medicines or to draw blood.   The catheter. The catheter is a long, slender tube that extends from the reservoir. The catheter is placed into a large vein.   Medicine that is inserted into the reservoir goes into the catheter and then into the vein.  INSERTION OF THE PORT  The port is surgically placed in either an operating room or in a procedural area (interventional radiology).   Medicine may be given to help you relax during the procedure.   The skin where the port will be inserted is numbed (local anesthetic).   1 or 2 small cuts (incisions) will be made in the skin to insert the port.   The port can be used after it has been inserted.  INCISION SITE CARE  The incision site may have small adhesive strips on it. This helps keep the incision site closed. Sometimes, no adhesive strips are placed. Instead of adhesive strips, a special kind of surgical glue is used to keep the incision closed.   If adhesive strips were placed on the incision sites, do not take them off. They will fall off on their own.   The incision site may be sore for 1 to 2 days. Pain medicine can help.   Do not get the incision site wet. Bathe or shower as directed by your caregiver.   The incision site should heal in 5 to 7 days. A small scar may  form after the incision has healed.  ACCESSING THE PORT Special steps must be taken to access the port:  Before the port is accessed, a numbing cream can be placed on the skin. This helps numb the skin over the port site.   A sterile technique is used to access the port.   The port is accessed with a needle. Only "non-coring" port needles should be used to access the port. Once the port is accessed, a blood return should be checked. This helps ensure the port is in the vein and is not clogged (clotted).   If your caregiver believes your port should remain accessed, a clear (transparent) bandage will be placed over the needle site. The bandage and needle will need to be changed every week or as directed by your caregiver.   Keep the bandage covering the needle clean and dry. Do not get it wet. Follow your caregiver's instructions on how to take a shower or bath when the port is accessed.   If your port does not need to stay accessed, no bandage is needed over the port.  FLUSHING THE PORT Flushing the port keeps it from getting clogged. How often the port is flushed depends on:  If a constant infusion is running. If a constant infusion is running, the   port may not need to be flushed.   If intermittent medicines are given.   If the port is not being used.  For intermittent medicines:  The port will need to be flushed:   After medicines have been given.   After blood has been drawn.   As part of routine maintenance.   A port is normally flushed with:   Normal saline.   Heparin.   Follow your caregiver's advice on how often, how much, and the type of flush to use on your port.  IMPORTANT PORT INFORMATION  Tell your caregiver if you are allergic to heparin.   After your port is placed, you will get a manufacturer's information card. The card has information about your port. Keep this card with you at all times.   There are many types of ports available. Know what kind of port  you have.   In case of an emergency, it may be helpful to wear a medical alert bracelet. This can help alert health care workers that you have a port.   The port can stay in for as long as your caregiver believes it is necessary.   When it is time for the port to come out, surgery will be done to remove it. The surgery will be similar to how the port was put in.   If you are in the hospital or clinic:   Your port will be taken care of and flushed by a nurse.   If you are at home:   A home health care nurse may give medicines and take care of the port.   You or a family member can get special training and directions for giving medicine and taking care of the port at home.  SEEK IMMEDIATE MEDICAL CARE IF:   Your port does not flush or you are unable to get a blood return.   New drainage or pus is coming from the incision.   A bad smell is coming from the incision site.   You develop swelling or increased redness at the incision site.   You develop increased swelling or pain at the port site.   You develop swelling or pain in the surrounding skin near the port.   You have an oral temperature above 102 F (38.9 C), not controlled by medicine.  MAKE SURE YOU:   Understand these instructions.   Will watch your condition.   Will get help right away if you are not doing well or get worse.  Document Released: 07/10/2005 Document Revised: 06/29/2011 Document Reviewed: 10/01/2008 [facility] Patient Information 2012 [place], [place].  PATIENT INSTRUCTIONS POST-ANESTHESIA  IMMEDIATELY FOLLOWING SURGERY:  Do not drive or operate machinery for the first twenty four hours after surgery.  Do not make any important decisions for twenty four hours after surgery or while taking narcotic pain medications or sedatives.  If you develop intractable nausea and vomiting or a severe headache please notify your doctor immediately.  FOLLOW-UP:  Please make an appointment with your surgeon as  instructed. You do not need to follow up with anesthesia unless specifically instructed to do so.  WOUND CARE INSTRUCTIONS (if applicable):  Keep a dry clean dressing on the anesthesia/puncture wound site if there is drainage.  Once the wound has quit draining you may leave it open to air.  Generally you should leave the bandage intact for twenty four hours unless there is drainage.  If the epidural site drains for more than 36-48 hours please call the anesthesia  department.  QUESTIONS?:  Please feel free to call your physician or the hospital operator if you have any questions, and they will be happy to assist you.     [facility] Anesthesia Department [address] [place] [phone]

## 2011-11-27 ENCOUNTER — Other Ambulatory Visit (HOSPITAL_COMMUNITY): Payer: Self-pay | Admitting: Oncology

## 2011-11-27 ENCOUNTER — Encounter (HOSPITAL_BASED_OUTPATIENT_CLINIC_OR_DEPARTMENT_OTHER): Payer: Federal, State, Local not specified - PPO

## 2011-11-27 VITALS — BP 136/82 | HR 94 | Temp 98.5°F | Ht 67.0 in | Wt 161.4 lb

## 2011-11-27 DIAGNOSIS — Z5112 Encounter for antineoplastic immunotherapy: Secondary | ICD-10-CM

## 2011-11-27 DIAGNOSIS — Z5111 Encounter for antineoplastic chemotherapy: Secondary | ICD-10-CM

## 2011-11-27 DIAGNOSIS — C099 Malignant neoplasm of tonsil, unspecified: Secondary | ICD-10-CM

## 2011-11-27 MED ORDER — PALONOSETRON HCL INJECTION 0.25 MG/5ML
0.2500 mg | Freq: Once | INTRAVENOUS | Status: AC
  Administered 2011-11-27: 0.25 mg via INTRAVENOUS

## 2011-11-27 MED ORDER — SODIUM CHLORIDE 0.9 % IV SOLN
Freq: Once | INTRAVENOUS | Status: AC
  Administered 2011-11-27: 10:00:00 via INTRAVENOUS

## 2011-11-27 MED ORDER — SODIUM CHLORIDE 0.9 % IJ SOLN
INTRAMUSCULAR | Status: AC
  Filled 2011-11-27: qty 10

## 2011-11-27 MED ORDER — SODIUM CHLORIDE 0.9 % IJ SOLN
10.0000 mL | INTRAMUSCULAR | Status: DC | PRN
  Filled 2011-11-27: qty 10

## 2011-11-27 MED ORDER — DEXAMETHASONE SODIUM PHOSPHATE 4 MG/ML IJ SOLN: 12.0000 mg | Freq: Once | INTRAMUSCULAR | Status: DC

## 2011-11-27 MED ORDER — SODIUM CHLORIDE 0.9 % IV SOLN
1000.0000 mg/m2/d | INTRAVENOUS | Status: DC
  Administered 2011-11-27: 7500 mg via INTRAVENOUS
  Filled 2011-11-27: qty 150

## 2011-11-27 MED ORDER — SODIUM CHLORIDE 0.9 % IV SOLN: 150.0000 mg | Freq: Once | INTRAVENOUS | Status: DC

## 2011-11-27 MED ORDER — SODIUM CHLORIDE 0.9 % IV SOLN
100.0000 mg/m2 | Freq: Once | INTRAVENOUS | Status: AC
  Administered 2011-11-27: 187 mg via INTRAVENOUS
  Filled 2011-11-27: qty 187

## 2011-11-27 MED ORDER — FOSAPREPITANT DIMEGLUMINE INJECTION 150 MG
Freq: Once | INTRAVENOUS | Status: AC
  Administered 2011-11-27: 14:00:00 via INTRAVENOUS
  Filled 2011-11-27: qty 5

## 2011-11-27 MED ORDER — PALONOSETRON HCL INJECTION 0.25 MG/5ML
INTRAVENOUS | Status: AC
  Filled 2011-11-27: qty 5

## 2011-11-27 MED ORDER — CETUXIMAB CHEMO IV INJECTION 200 MG/100ML
400.0000 mg/m2 | Freq: Once | INTRAVENOUS | Status: AC
  Administered 2011-11-27: 700 mg via INTRAVENOUS
  Filled 2011-11-27: qty 350

## 2011-11-27 MED ORDER — DIPHENHYDRAMINE HCL 50 MG/ML IJ SOLN
INTRAMUSCULAR | Status: AC
  Filled 2011-11-27: qty 1

## 2011-11-27 MED ORDER — DIPHENHYDRAMINE HCL 50 MG/ML IJ SOLN
50.0000 mg | Freq: Once | INTRAMUSCULAR | Status: AC
  Administered 2011-11-27: 50 mg via INTRAVENOUS

## 2011-11-27 NOTE — Progress Notes (Signed)
At 1248 Pt c/o "cold" slight chills also present. vss and denies SOB or chest pain. Erbitux stopped for 10 min. Blankets given and symptoms relieved. erbitux restarted at 1256. Tolerated remainder of treatment without problems

## 2011-11-28 ENCOUNTER — Telehealth (HOSPITAL_COMMUNITY): Payer: Self-pay | Admitting: *Deleted

## 2011-11-28 ENCOUNTER — Encounter: Payer: Self-pay | Admitting: Radiation Oncology

## 2011-11-28 NOTE — Progress Notes (Signed)
Austin Mathis is 71 years old and seen initially by Dr. Dalene Carrow for bilateral neck adenopathy from a primary in the tonsil on the best 4.3 x 2.7 cm in size. 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 Dalene Carrow to start systemic chemotherapy. The family is under the impression that he does not have a treatable disease by radiation therapy and does not have curative therapy potentially. So I went over his films today examined his neck area which shows bilateral adenopathy on the left he has at least a 3 x 4 cm conglomeration of lymph nodes on the right he has a 3 x 3 cm lymph node mass in the mid cervical chains.  I talked to Dr. Arnette Schaumann and he and I both feel that he is potentially curative individual in spite of his stage IV disease. I therefore set up a consultation with Dr. Roselind Messier and what we plan to do is give him 2 cycles of this pre-radiation and chemotherapy to reduce the size of tumor and then move on with a consultation with radiation therapy and a dental oncology with Dr. Kristin Bruins. During the radiation therapy we will give him cisplatin chemotherapy.

## 2011-11-28 NOTE — Progress Notes (Signed)
Addended by: Glenford Peers S on: 11/28/2011 04:25 PM   Modules accepted: Level of Service

## 2011-11-28 NOTE — Telephone Encounter (Signed)
Pt tolerated chemo well. States he is swallowing better and some of his congestion is gone.

## 2011-11-28 NOTE — Progress Notes (Addendum)
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 .     NEVER SMOKED NO ALCOHOL USE    ALLERGIES...Marland KitchenMarland KitchenNKA  .................................................................                  ..Marland KitchenMarland KitchenMarland KitchenMarland Kitchen

## 2011-11-29 ENCOUNTER — Ambulatory Visit
Admission: RE | Admit: 2011-11-29 | Discharge: 2011-11-29 | Disposition: A | Discharge status: Home / Self Care | Payer: Federal, State, Local not specified - PPO | Source: Ambulatory Visit | Attending: Radiation Oncology | Admitting: Radiation Oncology

## 2011-11-29 ENCOUNTER — Telehealth (HOSPITAL_COMMUNITY): Payer: Self-pay | Admitting: *Deleted

## 2011-11-29 ENCOUNTER — Encounter: Payer: Self-pay | Admitting: Radiation Oncology

## 2011-11-29 VITALS — BP 135/77 | HR 80 | Temp 97.7°F | Resp 18 | Wt 174.3 lb

## 2011-11-29 DIAGNOSIS — Z510 Encounter for antineoplastic radiation therapy: Secondary | ICD-10-CM | POA: Insufficient documentation

## 2011-11-29 DIAGNOSIS — C099 Malignant neoplasm of tonsil, unspecified: Secondary | ICD-10-CM | POA: Insufficient documentation

## 2011-11-29 NOTE — Progress Notes (Signed)
HERE TODAY FOR CONSULT OF METASTATIC CERVICAL LYMPH NODES, HISTORY OF TONSILLAR.  CHEMO AT [facility].   GETTING CHEMO NOW, 5FU .  NO N/V OR DIARRHEA.   NO PAIN.  SWALLOWING OK, BEFORE CHEMO HAD DIFFICULTY SWALLOWING, BUT BETTER NOW SINCE CHEMO STARTED.  ABLE TO SWALLOW SOLID FOODS.  CAN'T OPEN MOUTH AS WIDE AS NORMALLY COULD.  TUMOR IN MOUTH , IN HARD PALATE TO THE RIGHT.  HE THINKS IT IS SMALLER SINCE CHEMO STARTED.  HAS HICCOUGHS

## 2011-11-29 NOTE — Telephone Encounter (Signed)
Pt has not had BM in 2 days and he is going to take MOM 2-4 tbsp tonight if no BM before then. Otherwise doing well. States he is taking in 2400-3600 cc's of fluids daily.

## 2011-11-29 NOTE — Progress Notes (Signed)
Radiation Oncology         [phone] ________________________________  Initial outpatient Consultation  Name: Austin Mathis MRN: [id number]  Date: 11/29/2011  DOB: 01-25-1941  WU:[id number],YNWGN, MD, MD  , Austin Mathis, Cindra Eves    REFERRING PHYSICIAN: Mariel Mathis, Austin Mathis  DIAGNOSIS: The encounter diagnosis was Cancer of tonsil.  HISTORY OF PRESENT ILLNESS::Austin Mathis is a 71 y.o. male who is seen out of the courtesy of Dr. Mariel Mathis  for an opinion concerning radiation therapy as part of the management of the patient's advanced oral pharyngeal carcinoma.  Mr. Austin Mathis. fret and normally lives in the [place] area. Several months ago he began experiencing some difficulties with swallowing and some pain associated with swallowing. He also developed worsening right hearing loss. At the encouragement of daughter and wife the patient presented to the [facility] area for further evaluation.  Patient was seen by Dr. Gerda Mathis and a MRI of the neck was performed. This demonstrated a an advanced oropharyngeal carcinoma with bilateral neck adenopathy. Details of the study are as below.. The patient was seen by Dr. Suszanne Mathis and a subsequent biopsy of the right tonsillar area revealed invasive squamous cell carcinoma. The tumor was strongly positive for p16 immunohistochemical staining.Marland Kitchen He was seen by Dr. Dalene Mathis and a chest CT scan was performed which showed no evidence of chest involvement. A subsequent PET scan was performed which is detailed below but showed bilateral cervical adenopathy as well as extensive uptake in the oropharyngeal mass. Patient was also started on cis-platinum and 5-FU for his stage IV tonsillar carcinoma. Dr. Mariel Mathis has taken over the patient's care at [facility] and he did feel that the radiation oncology consultation would be of benefit to the patient.   PREVIOUS RADIATION THERAPY: No  PAST MEDICAL HISTORY:  has a past medical history of Hypertension; High cholesterol;  and Tonsillar cancer.    PAST SURGICAL HISTORY: Past Surgical History  Procedure Date  . Placement of port a cath   . Portacath placement 11/24/2011    Procedure: INSERTION PORT-A-CATH;  Surgeon: Austin Hatcher, MD;  Location: [facility];  Service: General;  Laterality: Left;    FAMILY HISTORY: family history is not on file.  SOCIAL HISTORY:  reports that he has never smoked. He does not have any smokeless tobacco history on file. He reports that he does not drink alcohol or use illicit drugs.  ALLERGIES: Review of patient's allergies indicates no known allergies.  MEDICATIONS:  Current Outpatient Prescriptions  Medication Sig Dispense Refill  . dexamethasone (DECADRON) 4 MG tablet Take 2 tablets by mouth once a day on the day after chemotherapy and then take 2 tablets two times a day for 2 days. Take with food.  30 tablet  1  . lidocaine-prilocaine (EMLA) cream Apply 1 application topically as needed.      Marland Kitchen lisinopril (PRINIVIL,ZESTRIL) 40 MG tablet Take 40 mg by mouth every morning.       . ondansetron (ZOFRAN) 8 MG tablet Take 1 tablet two times a day as needed for nausea or vomiting starting on the third day after chemotherapy.  30 tablet  1  . pravastatin (PRAVACHOL) 40 MG tablet Take 40 mg by mouth at bedtime.      . prochlorperazine (COMPAZINE) 10 MG tablet Take 1 tablet (10 mg total) by mouth every 6 (six) hours as needed (Nausea or vomiting).  30 tablet  1  . verapamil (CALAN-SR) 120 MG CR tablet Take 120 mg by mouth at bedtime.  REVIEW OF SYSTEMS:  A 15 point review of systems is documented in the electronic medical record. This was obtained by the nursing staff. However, I reviewed this with the patient to discuss relevant findings and make appropriate changes. As above the patient did present with the odynophagia and dysphagia. He has also noticed problems with trismus. In addition patient does have decreased hearing on the right side which has been somewhat chronic but  worsened over the past few months.   PHYSICAL EXAM:  weight is 174 lb 4.8 oz (79.062 kg). His oral temperature is 97.7 F (36.5 C). His blood pressure is 135/77 and his pulse is 80. His respiration is 18 and oxygen saturation is 96%.   This is a pleasant 71 year old gentleman in no acute distress. He is accompanied by his wife and daughter on evaluation today. The pupils are equal round and reactive to light. The extraocular eye movements are intact. The tongue is midline. The patient does have obvious trismus He is able to open his mouth with separation approximately 2 cm. The neck area reveals approximate a 5 x 6 cm conglomerate of nodes in the right upper neck. The left neck shows  3.0 x 2.5 cm centimeter left jugulodigastric node. There is no subclavicular or axillary adenopathy. Examination of the lungs reveals them to be clear. The heart has a regular rhythm and rate. The abdomen is soft and nontender with normal bowel sounds. His extremities reveals no cyanosis clubbing or edema. Peripheral pulses are good. Patient's strength is good in all 4 extremities He  does have somewhat of a tanned complexion.  Patient has Port-A-Cath in place with IV infusion. Oral cavity reveals as above trismus. This does somewhat compromise the exam. The patient has large mass which appears to have its epicenter in the right oropharyngeal/tonsillar area. Tumor does extend into the soft palate area and grossly approaches midline. Addition the tumor extends up into the right gingival sulcus and he likely has Pterygoid muscle involvement in light of his trismus. There is no obvious base of tongue involvement with palpation.  LABORATORY DATA:  Lab Results  Component Value Date   WBC 9.5 11/22/2011   HGB 14.4 11/22/2011   HCT 42.7 11/22/2011   MCV 93.2 11/22/2011   PLT 320 11/22/2011   Lab Results  Component Value Date   NA 138 11/22/2011   K 4.3 11/22/2011   CL 96 11/22/2011   CO2 34* 11/22/2011   Lab Results  Component Value Date    ALT 11 11/22/2011   AST 13 11/22/2011   ALKPHOS 73 11/22/2011   BILITOT 0.3 11/22/2011     RADIOGRAPHY: Ct Chest W Contrast  11/20/2011  *RADIOLOGY REPORT*  Clinical Data: Hemoptysis with 20 pounds weight loss in 5 months. Recent diagnosis of tonsillar cancer.  CT CHEST WITH CONTRAST  Technique:  Multidetector CT imaging of the chest was performed following the standard protocol during bolus administration of intravenous contrast.  Contrast: 80mL OMNIPAQUE IOHEXOL 300 MG/ML  SOLN  Comparison: Plain films of 04/09/2007.  No prior CT.  Findings: Lung windows demonstrate probable subpleural lymph node along the right minor fissure on image 29 at 3 mm.  Mild nonspecific subpleural interstitial thickening within the right middle lobe, including on image 44.  Soft tissue windows demonstrate no supraclavicular adenopathy. Tortuous descending thoracic aorta.  Heart size upper normal, without pericardial or pleural effusion.  Upper normal right cardiophrenic angle nodes, maximally 7 mm.  Favored to be reactive. Multivessel coronary artery atherosclerosis.  No central pulmonary embolism, on this non-dedicated study.  No mediastinal or hilar adenopathy.  Limited abdominal imaging demonstrates scattered too small to characterize liver lesions, likely cysts.  A splenule. Normal adrenal glands.  Superior endplate bone island T12.  IMPRESSION:  1.  No acute process or explanation for hemoptysis/weight loss. 2.  No evidence of metastatic disease within the chest. 3.  Borderline enlarged right cardiophrenic angle nodes.  Favored to be reactive. Recommend attention on follow-up.  Original Report Authenticated By: Consuello Bossier, M.D.   Nm Pet Image Initial (pi) Skull Base To Thigh  11/23/2011  *RADIOLOGY REPORT*  Clinical Data: Initial treatment strategy for tonsillar cancer.  NUCLEAR MEDICINE PET SKULL BASE TO THIGH  Fasting Blood Glucose:  102  Technique:  14.9 mCi F-18 FDG was injected intravenously. CT data was obtained and  used for attenuation correction and anatomic localization only.  (This was not acquired as a diagnostic CT examination.) Additional exam technical data entered on technologist worksheet.  Comparison:  CT chest 11/20/2011 and MR neck 10/27/2011.  Findings:  Neck: A soft tissue mass in the right oropharynx has an S U V max of 18.2. Right internal jugular adenopathy is better measured on 10/27/2011 and has an S U V max of 11.4.  Patchy uptake is seen in the parotid and submandibular glands bilaterally.  There may be mild peripheral hypermetabolism associated with necrotic submandibular lymph nodes bilaterally.  No additional areas of abnormal hypermetabolism in the neck.  CT images show no acute findings.  Right mastoid effusion persists.  Chest:  No hypermetabolic mediastinal or hilar nodes.  No suspicious pulmonary nodules on the CT scan. CT images show no pericardial or pleural effusion.  Coronary artery calcification.  Abdomen/Pelvis:  No abnormal hypermetabolic activity within the liver, pancreas, adrenal glands, or spleen.  No hypermetabolic lymph nodes in the abdomen or pelvis. CT images show possible sludge layering in the gallbladder.  Bladder wall appears thickened but the bladder is rather under distended.  No free fluid.  [place]:  No focal hypermetabolic activity to suggest skeletal metastasis.  IMPRESSION: Malignant right oral pharyngeal mass with metastatic adenopathy seen primarily in the right neck.  Necrotic left neck lymph node likely has associated peripheral hypermetabolism as well.  Original Report Authenticated By: Reyes Ivan, M.D.   Dg Chest Portable 1 View  11/24/2011  *RADIOLOGY REPORT*  Clinical Data: Port-a-cath insertion, tonsillar cancer  PORTABLE CHEST - 1 VIEW  Comparison: Portable exam 1112 hours compared to 04/09/2007  Findings: Left subclavian Port-A-Cath, tip projecting over SVC. No pneumothorax. Upper normal heart size. Tortuous aorta. Pulmonary vascularity normal.  Peribronchial thickening with minimal atelectasis in right upper lobe. Remaining lungs clear. No pleural effusion or acute osseous abnormality.  IMPRESSION: No pneumothorax following Port-A-Cath insertion. Mild bronchitic changes with subsegmental atelectasis right upper lobe.  Findings discussed with Dr. Malvin Johns on 11/24/2011 at 1124 hours.  Original Report Authenticated By: Lollie Marrow, M.D.      .        IMPRESSION: Stage IV oropharyngeal carcinoma. This tumor appears to originate in the right tonsillar area. On the patient's MRI this tumor did extend up into the right nasopharynx area. The images on MRI were 5.9 x 4.3 x 2.7 cm. Patient's MRI also showed bilateral neck adenopathy as well as probable right retropharyngeal lymph node measuring 1.0 cm. In addition there was obstruction of the eustachian tube on the right side from tumor.  Since patient's disease is limited to the head  and neck area I do feel he would be a candidate for combined modality therapy encompassing radiosensitizing chemotherapy as well as IMRT.  Since this tumor is HPV positive I would also anticipate that he would have a good response to combined modality therapy. As above the patient is a nonsmoker and nondrinker.  He in addition has an excellent performance status.  PLAN: The patient will proceed with neoadjuvant chemotherapy for approximately 2 cycles. This may be helpful with the patient's trismus and being able to perform more adequate dental extractions with Dr. Kristin Bruins.  The patient will proceed with dental evaluation later this week or early next week. I also at some point will schedule the patient for speech pathology evaluation. In addition I did discuss with the patient the consideration for a feeding tube placement in light of anticipated side effects with this combined modality therapy. I spent 60 minutes minutes face to face with the patient and more than 50% of that time was spent in counseling and/or  coordination of care.  He does wish to proceed with radiation therapy as part of his overall management.   ------------------------------------------------   Billie Lade, PhD, MD

## 2011-11-30 ENCOUNTER — Encounter (HOSPITAL_COMMUNITY): Payer: Self-pay | Admitting: Dentistry

## 2011-11-30 ENCOUNTER — Telehealth (HOSPITAL_COMMUNITY): Payer: Self-pay | Admitting: *Deleted

## 2011-11-30 ENCOUNTER — Ambulatory Visit (HOSPITAL_COMMUNITY): Payer: Self-pay | Admitting: Dentistry

## 2011-11-30 VITALS — BP 133/76 | HR 68 | Temp 98.0°F

## 2011-11-30 DIAGNOSIS — R252 Cramp and spasm: Secondary | ICD-10-CM

## 2011-11-30 DIAGNOSIS — Z0189 Encounter for other specified special examinations: Secondary | ICD-10-CM

## 2011-11-30 DIAGNOSIS — M264 Malocclusion, unspecified: Secondary | ICD-10-CM

## 2011-11-30 DIAGNOSIS — K045 Chronic apical periodontitis: Secondary | ICD-10-CM

## 2011-11-30 DIAGNOSIS — C099 Malignant neoplasm of tonsil, unspecified: Secondary | ICD-10-CM

## 2011-11-30 DIAGNOSIS — K083 Retained dental root: Secondary | ICD-10-CM

## 2011-11-30 DIAGNOSIS — K08109 Complete loss of teeth, unspecified cause, unspecified class: Secondary | ICD-10-CM

## 2011-11-30 DIAGNOSIS — K036 Deposits [accretions] on teeth: Secondary | ICD-10-CM

## 2011-11-30 DIAGNOSIS — K0889 Other specified disorders of teeth and supporting structures: Secondary | ICD-10-CM

## 2011-11-30 NOTE — Telephone Encounter (Signed)
Pt came by today. Doing very well. Only c/o hiccoughs and slight heartburn. Using "rolaids" with relief. He requested to move next weeks erbitux from Monday to Tuesday so he can go to see his 73 yr old mother in [place] to let her know whats going on. His wife will be driving and he was instructed to get out and walk about every couple of hours during the trip.

## 2011-11-30 NOTE — Progress Notes (Signed)
DENTAL CONSULTATION  Date of Consultation:  11/30/2011 Patient Name:   Austin Mathis Date of Birth:   [birth year] Medical Record Number: [id number]  VITALS: BP 133/76  Pulse 68  Temp 98 F (36.7 C)   CHIEF COMPLAINT: Patient referred for a preradiation therapy dental consultation.  HPI: Austin Mathis is a 71 year old male referred by Dr. Antony Blackbird for a dental consultation. Patient with recent diagnosis of squamous cell carcinoma involving the right tonsil. Patient is currently under active chemotherapy. Patient with anticipated radiation therapy with Dr. Roselind Messier. Patient is now seen as part of a preradiation therapy dental protocol evaluation.  Patient currently denies acute toothache, swellings, or abscesses. Patient has not seen a dentist for " a long,long time".  Patient thinks that the last time he saw the dentist was for a toothache. Patient knows that his teeth are "not in good shape". Patient denies having partial dentures. Patient has no regular primary dentist.  PMH: Past Medical History  Diagnosis Date  . Hypertension   . High cholesterol   . Tonsillar cancer   . Hearing deficit     Right greater than left.    PSH: Past Surgical History  Procedure Date  . Placement of port a cath   . Portacath placement 11/24/2011    Procedure: INSERTION PORT-A-CATH;  Surgeon: Marlane Hatcher, MD;  Location: [facility];  Service: General;  Laterality: Left;    ALLERGIES: No Known Allergies  MEDICATIONS: Current Outpatient Prescriptions  Medication Sig Dispense Refill  . dexamethasone (DECADRON) 4 MG tablet Take 2 tablets by mouth once a day on the day after chemotherapy and then take 2 tablets two times a day for 2 days. Take with food.  30 tablet  1  . lidocaine-prilocaine (EMLA) cream Apply 1 application topically as needed.      Marland Kitchen lisinopril (PRINIVIL,ZESTRIL) 40 MG tablet Take 40 mg by mouth every morning.       . ondansetron (ZOFRAN) 8 MG tablet Take 1 tablet two times a  day as needed for nausea or vomiting starting on the third day after chemotherapy.  30 tablet  1  . pravastatin (PRAVACHOL) 40 MG tablet Take 40 mg by mouth at bedtime.      . prochlorperazine (COMPAZINE) 10 MG tablet Take 1 tablet (10 mg total) by mouth every 6 (six) hours as needed (Nausea or vomiting).  30 tablet  1  . verapamil (CALAN-SR) 120 MG CR tablet Take 120 mg by mouth at bedtime.        LABS: Lab Results  Component Value Date   WBC 9.5 11/22/2011   HGB 14.4 11/22/2011   HCT 42.7 11/22/2011   MCV 93.2 11/22/2011   PLT 320 11/22/2011      Component Value Date/Time   NA 138 11/22/2011 1410   K 4.3 11/22/2011 1410   CL 96 11/22/2011 1410   CO2 34* 11/22/2011 1410   GLUCOSE 157* 11/22/2011 1410   BUN 13 11/22/2011 1410   CREATININE 0.83 11/22/2011 1410   CALCIUM 10.2 11/22/2011 1410   GFRNONAA 86* 11/22/2011 1410   GFRAA >90 11/22/2011 1410   No results found for this basename: INR, PROTIME   No results found for this basename: PTT    SOCIAL HISTORY: History   Social History  . Marital Status: Married    Spouse Name: N/A    Number of Children: N/A  . Years of Education: N/A   Occupational History  . Retired Engineer, civil (consulting)    Social History Main  Topics  . Smoking status: Never Smoker   . Smokeless tobacco: Never Used  . Alcohol Use: No     Rare use of alcohol.  . Drug Use: No  . Sexually Active: Not on file   Other Topics Concern  . Not on file   Social History Narrative   Patient is married with 7 children. Patient is a [address] but is planning on retiring to [place]. 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 Patient is a nonsmoker nondrinker.Patient has never used smokeless tobacco.Mother is alive and in her 25s. Father passed away at the age of 33.Patient has other relatives that lived to be 100+.    FAMILY HISTORY: Mother is alive in her 79's. Other past away at the age of 22. Other members of his  family live to be 65+ years of age.   REVIEW OF SYSTEMS: Reviewed with patient and positive positive as above.  DENTAL HISTORY: CHIEF COMPLAINT: Patient referred for a preradiation therapy dental consultation.  HPI: Austin Mathis is a 71 year old male referred by Dr. Antony Blackbird for a dental consultation. Patient with recent diagnosis of squamous cell carcinoma involving the right tonsil. Patient is currently under active chemotherapy. Patient with anticipated radiation therapy with Dr. Roselind Messier. Patient is now seen as part of a preradiation therapy dental protocol evaluation.  Patient currently denies acute toothache, swellings, or abscesses. Patient has not seen a dentist for " a long,long time".  Patient thinks that the last time he saw the dentist was for a toothache. Patient knows that his teeth are "not in good shape". Patient denies having partial dentures. Patient has no regular primary dentist.  DENTAL EXAMINATION:  GENERAL: Patient is a well-developed, well-nourished male in no acute distress. HEAD AND NECK: The patient has bilateral neck lymphadenopathy. Patient has trismus symptoms with maximum interincisal opening of 25 mm. Patient denies acute TMJ symptoms at this time, however.  INTRAORAL EXAM: He has normal saliva. There is no evidence of abscess formation within the mouth.  DENTITION: Patient is missing tooth numbers 2, 19, 31, and 32. Tooth #4 is present as a retained root tip. PERIODONTAL: Patient with chronic, advanced periodontal disease with plaque and calculus accumulations, generalized gingival recession and generalized tooth mobility. There is moderate to severe bone loss noted.  DENTAL CARIES/SUBOPTIMAL RESTORATIONS: There are  multiple dental caries and suboptimal dental restorations noted as per dental charting form.  ENDODONTIC: Patient currently denies acute pulpitis symptoms. Patient does have multiple areas of periapical pathology and radiolucency. CROWN AND  BRIDGE: Patient has an acrylic faced Gold crown on tooth #9 with less than ideal contours. PROSTHODONTIC:There are no partial dentures. OCCLUSION: Patient has poor occlusal scheme. Patient with multiple missing teeth, retained root segments, supra-eruption and drifting of the unopposed teeth into the edentulous areas and lack of replacement of the missing teeth with dental prostheses.  RADIOGRAPHIC INTERPRETATION: An orthopantogram  and full series of dental radiographs were obtained. There are multiple missing teeth. There is a retained root tip in the area #4. There multiple dental caries. There multiple areas of periapical pathology and radiolucency. There is supra-eruption and drifting of the unopposed teeth into the edentulous areas. There are other multiple retained roots. There is moderate to severe bone loss. Radiographic calculus is noted.   ASSESSMENTS: 1. 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 Plaque and calculus accumulation 3. Generalized gingival recession 4. Generalized tooth mobility 5. Multiple dental caries  and suboptimal dental restorations 6. Retained roots areas #4, 14 and 15. 7. Multiple missing teeth 8. Supra-eruption and drifting of the unopposed teeth into the edentulous areas 9. Lack of replacement of missing teeth with dental prostheses 10. Chronic apical periodontitis 11. Poor occlusal scheme and malocclusion 12. 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  PLAN/RECOMMENDATIONS: 1. I discussed the risks, benefits, and complications of various treatment options with the patient in relationship to his medical and dental conditions. We discussed various treatment options to include no treatment, multiple extractions with alveoloplasty, pre-prosthetic surgery as indicated, periodontal therapy, dental restorations, root canal therapy, crown and bridge therapy, implant therapy, and replacement of missing teeth as  indicated. After a discussion with Dr. Antony Blackbird, the anticipated ports and doses were verified to include both molar and premolar regions. Due to the extensive radiation field and the potential future complications of osteoradionecrosis, the patient currently wishes to proceed with extraction of remaining teeth with alveoloplasty and pre-prosthetic surgery as indicated in the operating room.  Timing of these dental extractions is to the be determined after further discussion with Dr. Glenford Peers and Dr. Antony Blackbird. The extractions will need to take place 2-3 weeks after the last scheduled chemotherapy session.  The patient will then need approximately 2-3 weeks of healing before proceeding with anticipated chemoradiation therapy.  2. Discussion of findings with medical team and coordination of future medical and dental care.  3. Patient was provided a trismus exercises device and instructed to use trismus exercises 3 times daily to increase his maximum interincisal opening currently measured at 25 mm.    Charlynne Pander, DDS

## 2011-11-30 NOTE — Patient Instructions (Addendum)

## 2011-12-01 ENCOUNTER — Encounter (HOSPITAL_COMMUNITY): Payer: Self-pay | Admitting: Oncology

## 2011-12-01 ENCOUNTER — Encounter (HOSPITAL_BASED_OUTPATIENT_CLINIC_OR_DEPARTMENT_OTHER): Payer: Federal, State, Local not specified - PPO

## 2011-12-01 DIAGNOSIS — C099 Malignant neoplasm of tonsil, unspecified: Secondary | ICD-10-CM

## 2011-12-01 DIAGNOSIS — Z5189 Encounter for other specified aftercare: Secondary | ICD-10-CM

## 2011-12-01 MED ORDER — SODIUM CHLORIDE 0.9 % IJ SOLN
INTRAMUSCULAR | Status: AC
  Filled 2011-12-01: qty 10

## 2011-12-01 MED ORDER — SODIUM CHLORIDE 0.9 % IJ SOLN
10.0000 mL | INTRAMUSCULAR | Status: DC | PRN
  Administered 2011-12-01: 10 mL via INTRAVENOUS
  Filled 2011-12-01: qty 10

## 2011-12-01 MED ORDER — HEPARIN SOD (PORK) LOCK FLUSH 100 UNIT/ML IV SOLN
500.0000 [IU] | Freq: Once | INTRAVENOUS | Status: AC
  Administered 2011-12-01: 500 [IU] via INTRAVENOUS
  Filled 2011-12-01: qty 5

## 2011-12-01 MED ORDER — HEPARIN SOD (PORK) LOCK FLUSH 100 UNIT/ML IV SOLN
INTRAVENOUS | Status: AC
  Filled 2011-12-01: qty 5

## 2011-12-01 MED ORDER — PEGFILGRASTIM INJECTION 6 MG/0.6ML
6.0000 mg | Freq: Once | SUBCUTANEOUS | Status: AC
  Administered 2011-12-01: 6 mg via SUBCUTANEOUS

## 2011-12-01 NOTE — Progress Notes (Signed)
Stated chemo went well. No nausea or vomiting.  Continuous pump D/C and port flushde without difficulty.  Neulasta 6 mg given sub-q to lower left quadrant of abd.

## 2011-12-02 ENCOUNTER — Ambulatory Visit (HOSPITAL_COMMUNITY): Payer: Federal, State, Local not specified - PPO

## 2011-12-04 ENCOUNTER — Inpatient Hospital Stay (HOSPITAL_COMMUNITY): Payer: Federal, State, Local not specified - PPO

## 2011-12-05 ENCOUNTER — Encounter (HOSPITAL_BASED_OUTPATIENT_CLINIC_OR_DEPARTMENT_OTHER): Payer: Federal, State, Local not specified - PPO

## 2011-12-05 VITALS — BP 134/78 | HR 68 | Temp 97.0°F | Wt 163.2 lb

## 2011-12-05 DIAGNOSIS — C099 Malignant neoplasm of tonsil, unspecified: Secondary | ICD-10-CM

## 2011-12-05 DIAGNOSIS — Z5112 Encounter for antineoplastic immunotherapy: Secondary | ICD-10-CM

## 2011-12-05 MED ORDER — HEPARIN SOD (PORK) LOCK FLUSH 100 UNIT/ML IV SOLN
500.0000 [IU] | Freq: Once | INTRAVENOUS | Status: AC | PRN
  Administered 2011-12-05: 500 [IU]
  Filled 2011-12-05: qty 5

## 2011-12-05 MED ORDER — CETUXIMAB CHEMO IV INJECTION 200 MG/100ML
250.0000 mg/m2 | Freq: Once | INTRAVENOUS | Status: AC
  Administered 2011-12-05: 500 mg via INTRAVENOUS
  Filled 2011-12-05: qty 250

## 2011-12-05 MED ORDER — HEPARIN SOD (PORK) LOCK FLUSH 100 UNIT/ML IV SOLN
INTRAVENOUS | Status: AC
  Filled 2011-12-05: qty 5

## 2011-12-05 MED ORDER — DIPHENHYDRAMINE HCL 50 MG/ML IJ SOLN
INTRAMUSCULAR | Status: AC
  Filled 2011-12-05: qty 1

## 2011-12-05 MED ORDER — SODIUM CHLORIDE 0.9 % IJ SOLN
INTRAMUSCULAR | Status: AC
  Filled 2011-12-05: qty 10

## 2011-12-05 MED ORDER — SODIUM CHLORIDE 0.9 % IJ SOLN
10.0000 mL | INTRAMUSCULAR | Status: DC | PRN
  Administered 2011-12-05: 10 mL
  Filled 2011-12-05: qty 10

## 2011-12-05 MED ORDER — DIPHENHYDRAMINE HCL 50 MG/ML IJ SOLN
50.0000 mg | Freq: Once | INTRAMUSCULAR | Status: AC
  Administered 2011-12-05: 50 mg via INTRAVENOUS

## 2011-12-05 MED ORDER — SODIUM CHLORIDE 0.9 % IV SOLN
Freq: Once | INTRAVENOUS | Status: AC
  Administered 2011-12-05: 11:00:00 via INTRAVENOUS

## 2011-12-05 NOTE — Progress Notes (Signed)
Tolerated erbitux infusion well.

## 2011-12-06 ENCOUNTER — Other Ambulatory Visit: Payer: Self-pay | Admitting: Certified Registered Nurse Anesthetist

## 2011-12-06 ENCOUNTER — Encounter (HOSPITAL_BASED_OUTPATIENT_CLINIC_OR_DEPARTMENT_OTHER): Payer: Federal, State, Local not specified - PPO | Admitting: Oncology

## 2011-12-06 DIAGNOSIS — B977 Papillomavirus as the cause of diseases classified elsewhere: Secondary | ICD-10-CM

## 2011-12-06 DIAGNOSIS — C099 Malignant neoplasm of tonsil, unspecified: Secondary | ICD-10-CM

## 2011-12-06 NOTE — Progress Notes (Signed)
Austin Punt, MD, MD [address] [place] [address]  1. Squamous cell carcinoma of tonsil     CURRENT THERAPY: S/P 1 cycle of Cisplatin Day 1 and 5-FU continuous infusion days 1-4 every 21 days and weekly cetuximab.  INTERVAL HISTORY: Austin Mathis 72 y.o. male returns for  regular  visit for followup of bilateral neck adenopathy from a primary in the tonsil on the best 4.3 x 2.7 cm in size. His biopsy showed squamous cell carcinoma consistent with a tonsillar primary with bilateral cervical adenopathy and the PET scan showed no distant disease. 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  The patient is here today to follow-up following cycle 1 of chemotherapy.  He tolerated it very well.  He is accompanied by his wife, daughter, and son Austin Mathis from [place]  Austin Mathis and the patient's wife had a number of questions.  His wife was very confused about the role of chemotherapy and radiation.  I spent a majority of my time answering their questions.  She did not understand the role of chemotherapy versus radiation.  I explained that chemotherapy is systemic therapy while radiation is local/regional therapy.  I explained how chemotherapy interferes with quickly dividing cells including malignant and benign cells.  She did not understand the terminology of shrinking versus killing cancer cells.  I explained that it malignancy terms, they are synonyms.   We discussed future therapy which will include 1 more cycle of chemotherapy, he will then undergo extraction tooth by Dr. Kristin Bruins (oncology dentist) and then undergo chemoradiation consisting of weekly cisplatin.  We spent some time discussing radiation and chemotherapy treatment.  I kept the radiation treatment generalized.  We briefly discussed the role of simulation.  We talked about the role of chemotherapy in the setting of radiation to be a sensitizing agent.  We discussed the side effects including mucositis, dysphagia, decreased  appetite, nausea, vomiting, and skin irritation.  He is electively pursing a PEG tube placement which is wise.  We spent some time reviewing the role of the PEG tube.  Austin Mathis, his son, asked about prognosis.  I explained that his father is potentially curable, but we will need to see how he responds to this upcoming therapy before we discuss prognosis and curability.    Austin Mathis is very much at peace.  He is a retired Engineer, civil (consulting) and he understands the process of chemotherapy and radiation.  He explains to his family that he knows that certain people respond differently to chemotherapy/radiation than others.  He understands that response rate and survivability is thus different.  He compliments our staff on his experience thus far and is very pleased with everyone's bedside manners.    Austin Mathis got a little upset when his son, Austin Mathis asked about prognosis.  He explained to his son that he is very content, and he will handle things as they come his way, but all he wants from his family is support.  His son responded that he simply wished to asked questions since he reside in [place] and will not be available during his father's treatments and reiterated the point that Austin Mathis invited Austin Mathis to ask any and all questions at this appointment.  It was certainly an interesting dynamic, but my feeling is that his son is very concerned for his father (rightfully so) and he is willing to ask the tough questions that maybe Austin Mathis is not interested in hearing at this juncture.  I informed  all visitors that Dr. Mariel Sleet and myself will not withhold any information from the patient and his family and we believe in honesty regarding diagnosis and prognosis.  We will certainly inform the patient and family regarding good news and bad news.  We will not withhold this information from them.    Past Medical History  Diagnosis Date  . Hypertension   . High cholesterol   . Tonsillar cancer   . Hearing deficit     Right  greater than left.  . 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      has no known allergies.  Austin Mathis had no medications administered during this visit.  Past Surgical History  Procedure Date  . Placement of port a cath   . Portacath placement 11/24/2011    Procedure: INSERTION PORT-A-CATH;  Surgeon: Marlane Hatcher, MD;  Location: [facility];  Service: General;  Laterality: Left;    Denies any headaches, dizziness, double vision, fevers, chills, night sweats, nausea, vomiting, diarrhea, constipation, chest pain, heart palpitations, shortness of breath, blood in stool, black tarry stool, urinary pain, urinary burning, urinary frequency, hematuria.   PHYSICAL EXAMINATION  ECOG PERFORMANCE STATUS: 0 - Asymptomatic  Filed Vitals:   12/06/11 1527  BP: 123/73  Pulse: 87  Temp: 97 F (36.1 C)    GENERAL:alert, no distress, well nourished, well developed, comfortable, cooperative and smiling SKIN: skin color, texture, turgor are normal, no rashes or significant lesions HEAD: Normocephalic EYES: normal, Conjunctiva are pink and non-injected EARS: External ears normal OROPHARYNX:lips, buccal mucosa, and tongue normal and mucous membranes are moist  NECK: supple, trachea midline, lymph node exam not performed today. LYMPH:  not examined BREAST:not examined LUNGS: clear to auscultation and percussion HEART: regular rate & rhythm, no murmurs, no gallops, S1 normal and S2 normal ABDOMEN:abdomen soft, non-tender and normal bowel sounds BACK: Back symmetric, no curvature., No CVA tenderness EXTREMITIES:less then 2 second capillary refill, no joint deformities, effusion, or inflammation, no edema, no skin discoloration, no clubbing, no cyanosis  NEURO: alert & oriented x 3 with fluent speech, no focal motor/sensory deficits, gait normal   LABORATORY DATA: CBC    Component Value  Date/Time   WBC 9.5 11/22/2011 1410   RBC 4.58 11/22/2011 1410   HGB 14.4 11/22/2011 1410   HCT 42.7 11/22/2011 1410   PLT 320 11/22/2011 1410   MCV 93.2 11/22/2011 1410   MCH 31.4 11/22/2011 1410   MCHC 33.7 11/22/2011 1410   RDW 12.8 11/22/2011 1410   LYMPHSABS 1.6 11/22/2011 1410   MONOABS 0.6 11/22/2011 1410   EOSABS 0.2 11/22/2011 1410   BASOSABS 0.0 11/22/2011 1410      Chemistry      Component Value Date/Time   NA 138 11/22/2011 1410   K 4.3 11/22/2011 1410   CL 96 11/22/2011 1410   CO2 34* 11/22/2011 1410   BUN 13 11/22/2011 1410   CREATININE 0.83 11/22/2011 1410      Component Value Date/Time   CALCIUM 10.2 11/22/2011 1410   ALKPHOS 73 11/22/2011 1410   AST 13 11/22/2011 1410   ALT 11 11/22/2011 1410   BILITOT 0.3 11/22/2011 1410      PATHOLOGY: Right tonsillar mass, invasive squamous cell carcinoma, HPV+, strong p16 immunohistochemical staining.    ASSESSMENT:  1. Bilateral neck adenopathy from a primary in the tonsil on the best 4.3 x 2.7 cm in size. His biopsy  showed squamous cell carcinoma consistent with a tonsillar primary with bilateral cervical adenopathy and the PET scan showed no distant disease. 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   PLAN:  1. I personally reviewed and went over laboratory results with the patient. 2. I personally reviewed and went over pathology results with the patient. 3. Pre-chemo lab work: CBC diff, BMET 4. After this upcoming cycle of chemotherapy 5/28, which will be his 2 cycle of chemotherapy, he will move on to chemoradiation consisting of cisplatin weekly.  5. Treatment plan altered to reflect #4.  I built the cisplatin plan and restricted his current treatment plan to only 2 cycles total.  Weekly cisplatin plan built at 40 mg/m2 x 7 cycles.  6. I answered all questions the family and patient posed to me.  7. Patient reports that he does not yet have an appointment with Dr. Roselind Messier nor Dr. Kristin Bruins.  They are awaiting for them to set-up an appointment. I  will inform Dr. Mariel Sleet and we can help facilitate these.  8. Will defer decision to Dr. Mariel Sleet regarding Cetuximab treatment.  9. Return in 4 weeks for follow-up.   All questions were answered. The patient knows to call the clinic with any problems, questions or concerns. We can certainly see the patient much sooner if necessary.  Caidyn Blossom

## 2011-12-06 NOTE — Patient Instructions (Addendum)
[facility]  Discharge Instructions Dameion Briles  [id number] 21-Apr-1941 Dr. Glenford Peers  RECOMMENDATIONS MADE BY THE CONSULTANT AND ANY TEST RESULTS WILL BE SENT TO YOUR REFERRING DOCTOR.   EXAM FINDINGS BY MD TODAY AND SIGNS AND SYMPTOMS TO REPORT TO CLINIC OR PRIMARY MD:   INSTRUCTIONS GIVEN AND DISCUSSED: We will continue with cycle 2 chemo Let us know when teeth will be extracted We will hold the other chemo and do just weekly Cisplatin during radiation  SPECIAL INSTRUCTIONS/FOLLOW-UP: Dr Mariel Sleet in one month   I acknowledge that I have been informed and understand all the instructions given to me and received a copy. I do not have any more questions at this time, but understand that I may call the Specialty Clinic at [facility] at [phone] during business hours should I have any further questions or need assistance in obtaining follow-up care.

## 2011-12-08 ENCOUNTER — Ambulatory Visit: Payer: Federal, State, Local not specified - PPO | Attending: Hematology and Oncology | Admitting: Audiology

## 2011-12-08 DIAGNOSIS — H903 Sensorineural hearing loss, bilateral: Secondary | ICD-10-CM | POA: Insufficient documentation

## 2011-12-08 NOTE — Progress Notes (Signed)
Encounter addended by: Amanda Pea, RN on: 12/08/2011  4:47 PM<BR>     Documentation filed: Charges VN

## 2011-12-11 ENCOUNTER — Encounter (HOSPITAL_COMMUNITY): Payer: Federal, State, Local not specified - PPO | Admitting: Oncology

## 2011-12-11 ENCOUNTER — Encounter (HOSPITAL_BASED_OUTPATIENT_CLINIC_OR_DEPARTMENT_OTHER): Payer: Federal, State, Local not specified - PPO

## 2011-12-11 VITALS — BP 117/82 | HR 77 | Temp 96.8°F | Ht 67.0 in | Wt 165.0 lb

## 2011-12-11 DIAGNOSIS — B977 Papillomavirus as the cause of diseases classified elsewhere: Secondary | ICD-10-CM

## 2011-12-11 DIAGNOSIS — C099 Malignant neoplasm of tonsil, unspecified: Secondary | ICD-10-CM

## 2011-12-11 DIAGNOSIS — Z5112 Encounter for antineoplastic immunotherapy: Secondary | ICD-10-CM

## 2011-12-11 MED ORDER — SODIUM CHLORIDE 0.9 % IJ SOLN
INTRAMUSCULAR | Status: AC
  Filled 2011-12-11: qty 10

## 2011-12-11 MED ORDER — HEPARIN SOD (PORK) LOCK FLUSH 100 UNIT/ML IV SOLN
500.0000 [IU] | Freq: Once | INTRAVENOUS | Status: AC | PRN
  Administered 2011-12-11: 500 [IU]
  Filled 2011-12-11: qty 5

## 2011-12-11 MED ORDER — CETUXIMAB CHEMO IV INJECTION 200 MG/100ML
250.0000 mg/m2 | Freq: Once | INTRAVENOUS | Status: AC
  Administered 2011-12-11: 500 mg via INTRAVENOUS
  Filled 2011-12-11: qty 250

## 2011-12-11 MED ORDER — DIPHENHYDRAMINE HCL 50 MG/ML IJ SOLN
INTRAMUSCULAR | Status: AC
  Filled 2011-12-11: qty 1

## 2011-12-11 MED ORDER — SODIUM CHLORIDE 0.9 % IJ SOLN
10.0000 mL | INTRAMUSCULAR | Status: DC | PRN
  Filled 2011-12-11: qty 10

## 2011-12-11 MED ORDER — HEPARIN SOD (PORK) LOCK FLUSH 100 UNIT/ML IV SOLN
INTRAVENOUS | Status: AC
  Filled 2011-12-11: qty 5

## 2011-12-11 MED ORDER — DIPHENHYDRAMINE HCL 50 MG/ML IJ SOLN
50.0000 mg | Freq: Once | INTRAMUSCULAR | Status: AC
  Administered 2011-12-11: 50 mg via INTRAVENOUS

## 2011-12-11 MED ORDER — SODIUM CHLORIDE 0.9 % IV SOLN
Freq: Once | INTRAVENOUS | Status: AC
  Administered 2011-12-11: 11:00:00 via INTRAVENOUS

## 2011-12-11 NOTE — Progress Notes (Signed)
Tolerated well

## 2011-12-15 ENCOUNTER — Other Ambulatory Visit (HOSPITAL_COMMUNITY): Payer: Self-pay | Admitting: Dentistry

## 2011-12-19 ENCOUNTER — Encounter (HOSPITAL_BASED_OUTPATIENT_CLINIC_OR_DEPARTMENT_OTHER): Payer: Federal, State, Local not specified - PPO

## 2011-12-19 ENCOUNTER — Other Ambulatory Visit (HOSPITAL_COMMUNITY): Payer: Self-pay | Admitting: Oncology

## 2011-12-19 VITALS — BP 104/62 | HR 80 | Temp 98.4°F | Wt 163.8 lb

## 2011-12-19 DIAGNOSIS — Z5111 Encounter for antineoplastic chemotherapy: Secondary | ICD-10-CM

## 2011-12-19 DIAGNOSIS — E876 Hypokalemia: Secondary | ICD-10-CM

## 2011-12-19 DIAGNOSIS — C099 Malignant neoplasm of tonsil, unspecified: Secondary | ICD-10-CM

## 2011-12-19 DIAGNOSIS — Z5112 Encounter for antineoplastic immunotherapy: Secondary | ICD-10-CM

## 2011-12-19 LAB — DIFFERENTIAL
Eosinophils Relative: 1 % (ref 0–5)
Lymphocytes Relative: 10 % — ABNORMAL LOW (ref 12–46)
Lymphs Abs: 1.1 10*3/uL (ref 0.7–4.0)
Monocytes Absolute: 1.1 10*3/uL — ABNORMAL HIGH (ref 0.1–1.0)
Neutro Abs: 8.7 10*3/uL — ABNORMAL HIGH (ref 1.7–7.7)

## 2011-12-19 LAB — COMPREHENSIVE METABOLIC PANEL
ALT: 9 U/L (ref 0–53)
AST: 12 U/L (ref 0–37)
Albumin: 2.9 g/dL — ABNORMAL LOW (ref 3.5–5.2)
Calcium: 7.9 mg/dL — ABNORMAL LOW (ref 8.4–10.5)
Sodium: 138 mEq/L (ref 135–145)
Total Protein: 6.1 g/dL (ref 6.0–8.3)

## 2011-12-19 LAB — CBC
HCT: 34.3 % — ABNORMAL LOW (ref 39.0–52.0)
MCV: 91.7 fL (ref 78.0–100.0)
RBC: 3.74 MIL/uL — ABNORMAL LOW (ref 4.22–5.81)
WBC: 11 10*3/uL — ABNORMAL HIGH (ref 4.0–10.5)

## 2011-12-19 LAB — MAGNESIUM: Magnesium: 1.3 mg/dL — ABNORMAL LOW (ref 1.5–2.5)

## 2011-12-19 MED ORDER — DEXAMETHASONE SODIUM PHOSPHATE 4 MG/ML IJ SOLN: 12.0000 mg | Freq: Once | INTRAMUSCULAR | Status: DC

## 2011-12-19 MED ORDER — SODIUM CHLORIDE 0.9 % IV SOLN
1000.0000 mg/m2/d | INTRAVENOUS | Status: DC
  Administered 2011-12-19: 7500 mg via INTRAVENOUS
  Filled 2011-12-19: qty 150

## 2011-12-19 MED ORDER — PALONOSETRON HCL INJECTION 0.25 MG/5ML
0.2500 mg | Freq: Once | INTRAVENOUS | Status: AC
  Administered 2011-12-19: 0.25 mg via INTRAVENOUS

## 2011-12-19 MED ORDER — POTASSIUM CHLORIDE CRYS ER 20 MEQ PO TBCR: EXTENDED_RELEASE_TABLET | ORAL | Status: DC

## 2011-12-19 MED ORDER — SODIUM CHLORIDE 0.9 % IV SOLN: 150.0000 mg | Freq: Once | INTRAVENOUS | Status: DC

## 2011-12-19 MED ORDER — SODIUM CHLORIDE 0.9 % IV SOLN
100.0000 mg/m2 | Freq: Once | INTRAVENOUS | Status: AC
  Administered 2011-12-19: 187 mg via INTRAVENOUS
  Filled 2011-12-19: qty 187

## 2011-12-19 MED ORDER — CETUXIMAB CHEMO IV INJECTION 200 MG/100ML
250.0000 mg/m2 | Freq: Once | INTRAVENOUS | Status: AC
  Administered 2011-12-19: 500 mg via INTRAVENOUS
  Filled 2011-12-19: qty 250

## 2011-12-19 MED ORDER — FOSAPREPITANT DIMEGLUMINE INJECTION 150 MG
Freq: Once | INTRAVENOUS | Status: AC
  Administered 2011-12-19: 11:00:00 via INTRAVENOUS
  Filled 2011-12-19: qty 5

## 2011-12-19 MED ORDER — MAGNESIUM SULFATE 40 MG/ML IJ SOLN
2.0000 g | Freq: Once | INTRAMUSCULAR | Status: DC
  Filled 2011-12-19: qty 50

## 2011-12-19 MED ORDER — DIPHENHYDRAMINE HCL 50 MG/ML IJ SOLN
50.0000 mg | Freq: Once | INTRAMUSCULAR | Status: AC
  Administered 2011-12-19: 50 mg via INTRAVENOUS

## 2011-12-19 MED ORDER — DIPHENHYDRAMINE HCL 50 MG/ML IJ SOLN
INTRAMUSCULAR | Status: AC
  Filled 2011-12-19: qty 1

## 2011-12-19 MED ORDER — HEPARIN SOD (PORK) LOCK FLUSH 100 UNIT/ML IV SOLN
500.0000 [IU] | Freq: Once | INTRAVENOUS | Status: DC | PRN
  Filled 2011-12-19: qty 5

## 2011-12-19 MED ORDER — MAGNESIUM SULFATE 40 MG/ML IJ SOLN: 2.0000 g | Freq: Once | INTRAMUSCULAR | Status: DC

## 2011-12-19 MED ORDER — SODIUM CHLORIDE 0.9 % IJ SOLN
INTRAMUSCULAR | Status: AC
  Filled 2011-12-19: qty 20

## 2011-12-19 MED ORDER — PALONOSETRON HCL INJECTION 0.25 MG/5ML
INTRAVENOUS | Status: AC
  Filled 2011-12-19: qty 5

## 2011-12-19 MED ORDER — SODIUM CHLORIDE 0.9 % IJ SOLN
10.0000 mL | INTRAMUSCULAR | Status: DC | PRN
  Administered 2011-12-19: 10 mL
  Filled 2011-12-19: qty 10

## 2011-12-19 MED ORDER — MAGNESIUM SULFATE 40 MG/ML IJ SOLN
2.0000 g | Freq: Once | INTRAMUSCULAR | Status: AC
  Administered 2011-12-19: 2 g via INTRAVENOUS
  Filled 2011-12-19: qty 50

## 2011-12-19 MED ORDER — SODIUM CHLORIDE 0.9 % IV SOLN
Freq: Once | INTRAVENOUS | Status: AC
  Administered 2011-12-19: 11:00:00 via INTRAVENOUS

## 2011-12-19 NOTE — Progress Notes (Signed)
Tolerated chemo well. 

## 2011-12-20 ENCOUNTER — Other Ambulatory Visit (HOSPITAL_COMMUNITY): Payer: Self-pay

## 2011-12-20 DIAGNOSIS — E876 Hypokalemia: Secondary | ICD-10-CM

## 2011-12-20 DIAGNOSIS — R790 Abnormal level of blood mineral: Secondary | ICD-10-CM

## 2011-12-20 MED ORDER — MAGNESIUM OXIDE 400 (241.3 MG) MG PO TABS: 400.0000 mg | ORAL_TABLET | Freq: Two times a day (BID) | ORAL | Status: DC

## 2011-12-23 ENCOUNTER — Encounter (HOSPITAL_COMMUNITY): Payer: Federal, State, Local not specified - PPO | Attending: Oncology

## 2011-12-23 VITALS — BP 146/81 | HR 65 | Temp 97.8°F | Wt 171.8 lb

## 2011-12-23 DIAGNOSIS — B977 Papillomavirus as the cause of diseases classified elsewhere: Secondary | ICD-10-CM | POA: Insufficient documentation

## 2011-12-23 DIAGNOSIS — R599 Enlarged lymph nodes, unspecified: Secondary | ICD-10-CM | POA: Insufficient documentation

## 2011-12-23 DIAGNOSIS — Z5189 Encounter for other specified aftercare: Secondary | ICD-10-CM

## 2011-12-23 DIAGNOSIS — H9190 Unspecified hearing loss, unspecified ear: Secondary | ICD-10-CM | POA: Insufficient documentation

## 2011-12-23 DIAGNOSIS — C099 Malignant neoplasm of tonsil, unspecified: Secondary | ICD-10-CM | POA: Insufficient documentation

## 2011-12-23 DIAGNOSIS — B009 Herpesviral infection, unspecified: Secondary | ICD-10-CM | POA: Insufficient documentation

## 2011-12-23 MED ORDER — SODIUM CHLORIDE 0.9 % IJ SOLN
10.0000 mL | INTRAMUSCULAR | Status: DC | PRN
  Administered 2011-12-23: 10 mL
  Filled 2011-12-23: qty 10

## 2011-12-23 MED ORDER — HEPARIN SOD (PORK) LOCK FLUSH 100 UNIT/ML IV SOLN
INTRAVENOUS | Status: AC
  Filled 2011-12-23: qty 5

## 2011-12-23 MED ORDER — HEPARIN SOD (PORK) LOCK FLUSH 100 UNIT/ML IV SOLN
500.0000 [IU] | Freq: Once | INTRAVENOUS | Status: AC | PRN
  Administered 2011-12-23: 500 [IU]
  Filled 2011-12-23: qty 5

## 2011-12-23 MED ORDER — PEGFILGRASTIM INJECTION 6 MG/0.6ML
6.0000 mg | Freq: Once | SUBCUTANEOUS | Status: DC
  Administered 2011-12-23: 6 mg via SUBCUTANEOUS

## 2011-12-23 MED ORDER — SODIUM CHLORIDE 0.9 % IJ SOLN
INTRAMUSCULAR | Status: AC
  Filled 2011-12-23: qty 10

## 2011-12-23 MED ORDER — PEGFILGRASTIM INJECTION 6 MG/0.6ML
SUBCUTANEOUS | Status: AC
  Filled 2011-12-23: qty 0.6

## 2011-12-23 NOTE — Progress Notes (Signed)
Pump disconnected. Vitals stable. Patient doing ok.

## 2011-12-24 ENCOUNTER — Ambulatory Visit (HOSPITAL_COMMUNITY): Payer: Federal, State, Local not specified - PPO

## 2011-12-25 ENCOUNTER — Encounter (HOSPITAL_BASED_OUTPATIENT_CLINIC_OR_DEPARTMENT_OTHER): Payer: Federal, State, Local not specified - PPO

## 2011-12-25 ENCOUNTER — Inpatient Hospital Stay (HOSPITAL_COMMUNITY): Payer: Federal, State, Local not specified - PPO

## 2011-12-25 ENCOUNTER — Other Ambulatory Visit (HOSPITAL_COMMUNITY): Payer: Self-pay | Admitting: Oncology

## 2011-12-25 DIAGNOSIS — B977 Papillomavirus as the cause of diseases classified elsewhere: Secondary | ICD-10-CM

## 2011-12-25 DIAGNOSIS — C099 Malignant neoplasm of tonsil, unspecified: Secondary | ICD-10-CM

## 2011-12-25 DIAGNOSIS — Z5112 Encounter for antineoplastic immunotherapy: Secondary | ICD-10-CM

## 2011-12-25 DIAGNOSIS — R12 Heartburn: Secondary | ICD-10-CM

## 2011-12-25 MED ORDER — FAMOTIDINE IN NACL 20-0.9 MG/50ML-% IV SOLN
INTRAVENOUS | Status: AC
  Filled 2011-12-25: qty 50

## 2011-12-25 MED ORDER — HEPARIN SOD (PORK) LOCK FLUSH 100 UNIT/ML IV SOLN
500.0000 [IU] | Freq: Once | INTRAVENOUS | Status: AC | PRN
  Administered 2011-12-25: 500 [IU]
  Filled 2011-12-25: qty 5

## 2011-12-25 MED ORDER — SODIUM CHLORIDE 0.9 % IJ SOLN
INTRAMUSCULAR | Status: AC
  Filled 2011-12-25: qty 10

## 2011-12-25 MED ORDER — CETUXIMAB CHEMO IV INJECTION 200 MG/100ML
250.0000 mg/m2 | Freq: Once | INTRAVENOUS | Status: AC
  Administered 2011-12-25: 500 mg via INTRAVENOUS
  Filled 2011-12-25: qty 250

## 2011-12-25 MED ORDER — HEPARIN SOD (PORK) LOCK FLUSH 100 UNIT/ML IV SOLN
INTRAVENOUS | Status: AC
  Filled 2011-12-25: qty 5

## 2011-12-25 MED ORDER — DIPHENHYDRAMINE HCL 50 MG/ML IJ SOLN
INTRAMUSCULAR | Status: AC
  Filled 2011-12-25: qty 1

## 2011-12-25 MED ORDER — FAMOTIDINE IN NACL 20-0.9 MG/50ML-% IV SOLN
20.0000 mg | Freq: Once | INTRAVENOUS | Status: AC
  Administered 2011-12-25: 20 mg via INTRAVENOUS

## 2011-12-25 MED ORDER — SODIUM CHLORIDE 0.9 % IV SOLN
Freq: Once | INTRAVENOUS | Status: AC
  Administered 2011-12-25: 11:00:00 via INTRAVENOUS

## 2011-12-25 MED ORDER — SODIUM CHLORIDE 0.9 % IJ SOLN
10.0000 mL | INTRAMUSCULAR | Status: DC | PRN
  Filled 2011-12-25: qty 10

## 2011-12-25 MED ORDER — DIPHENHYDRAMINE HCL 50 MG/ML IJ SOLN
50.0000 mg | Freq: Once | INTRAMUSCULAR | Status: AC
  Administered 2011-12-25: 50 mg via INTRAVENOUS

## 2011-12-25 NOTE — Progress Notes (Signed)
Presents today for erbitux only. Chief c/o hiccoughs and heart burn. Discussed starting prilosec and mylanta.famotidine IV given today with some relief. Tolerated erbitux without problems

## 2011-12-28 ENCOUNTER — Encounter (HOSPITAL_BASED_OUTPATIENT_CLINIC_OR_DEPARTMENT_OTHER): Payer: Federal, State, Local not specified - PPO

## 2011-12-28 DIAGNOSIS — R790 Abnormal level of blood mineral: Secondary | ICD-10-CM

## 2011-12-28 DIAGNOSIS — E876 Hypokalemia: Secondary | ICD-10-CM

## 2011-12-28 LAB — BASIC METABOLIC PANEL
BUN: 35 mg/dL — ABNORMAL HIGH (ref 6–23)
Calcium: 9 mg/dL (ref 8.4–10.5)
Creatinine, Ser: 1.55 mg/dL — ABNORMAL HIGH (ref 0.50–1.35)
GFR calc Af Amer: 50 mL/min — ABNORMAL LOW (ref >90)

## 2011-12-28 LAB — MAGNESIUM: Magnesium: 1.5 mg/dL (ref 1.5–2.5)

## 2011-12-28 NOTE — Progress Notes (Signed)
Addended by: Oda Kilts on: 12/28/2011 01:40 PM   Modules accepted: Orders

## 2011-12-28 NOTE — Progress Notes (Signed)
Austin Mathis presented for labwork. Labs per MD order drawn via Peripheral Line 24 gauge needle inserted in lt ac Good blood return present. Procedure without incident.  Needle removed intact. Patient tolerated procedure well.

## 2012-01-01 ENCOUNTER — Encounter (HOSPITAL_COMMUNITY): Payer: Self-pay

## 2012-01-01 ENCOUNTER — Encounter (HOSPITAL_BASED_OUTPATIENT_CLINIC_OR_DEPARTMENT_OTHER): Payer: Federal, State, Local not specified - PPO

## 2012-01-01 ENCOUNTER — Inpatient Hospital Stay (HOSPITAL_COMMUNITY): Payer: Federal, State, Local not specified - PPO

## 2012-01-01 DIAGNOSIS — B977 Papillomavirus as the cause of diseases classified elsewhere: Secondary | ICD-10-CM

## 2012-01-01 DIAGNOSIS — C099 Malignant neoplasm of tonsil, unspecified: Secondary | ICD-10-CM

## 2012-01-01 DIAGNOSIS — Z5112 Encounter for antineoplastic immunotherapy: Secondary | ICD-10-CM

## 2012-01-01 MED ORDER — SODIUM CHLORIDE 0.9 % IJ SOLN
10.0000 mL | INTRAMUSCULAR | Status: DC | PRN
  Administered 2012-01-01: 10 mL
  Filled 2012-01-01: qty 10

## 2012-01-01 MED ORDER — CETUXIMAB CHEMO IV INJECTION 200 MG/100ML
250.0000 mg/m2 | Freq: Once | INTRAVENOUS | Status: AC
  Administered 2012-01-01: 500 mg via INTRAVENOUS
  Filled 2012-01-01: qty 250

## 2012-01-01 MED ORDER — DIPHENHYDRAMINE HCL 50 MG/ML IJ SOLN
50.0000 mg | Freq: Once | INTRAMUSCULAR | Status: AC
  Administered 2012-01-01: 50 mg via INTRAVENOUS

## 2012-01-01 MED ORDER — HEPARIN SOD (PORK) LOCK FLUSH 100 UNIT/ML IV SOLN
500.0000 [IU] | Freq: Once | INTRAVENOUS | Status: AC | PRN
  Administered 2012-01-01: 500 [IU]
  Filled 2012-01-01: qty 5

## 2012-01-01 MED ORDER — HEPARIN SOD (PORK) LOCK FLUSH 100 UNIT/ML IV SOLN
INTRAVENOUS | Status: AC
  Filled 2012-01-01: qty 5

## 2012-01-01 MED ORDER — DIPHENHYDRAMINE HCL 50 MG/ML IJ SOLN
INTRAMUSCULAR | Status: AC
  Filled 2012-01-01: qty 1

## 2012-01-01 MED ORDER — SODIUM CHLORIDE 0.9 % IJ SOLN
INTRAMUSCULAR | Status: AC
  Filled 2012-01-01: qty 10

## 2012-01-01 MED ORDER — SODIUM CHLORIDE 0.9 % IV SOLN
Freq: Once | INTRAVENOUS | Status: AC
  Administered 2012-01-01: 11:00:00 via INTRAVENOUS

## 2012-01-01 NOTE — Progress Notes (Signed)
Tolerated well

## 2012-01-03 ENCOUNTER — Encounter (HOSPITAL_COMMUNITY): Payer: Federal, State, Local not specified - PPO | Admitting: Oncology

## 2012-01-04 ENCOUNTER — Encounter (HOSPITAL_COMMUNITY): Payer: Self-pay | Admitting: Pharmacy Technician

## 2012-01-08 ENCOUNTER — Encounter (HOSPITAL_COMMUNITY): Payer: Self-pay | Admitting: Oncology

## 2012-01-08 ENCOUNTER — Telehealth: Payer: Self-pay

## 2012-01-08 ENCOUNTER — Inpatient Hospital Stay (HOSPITAL_COMMUNITY): Payer: Federal, State, Local not specified - PPO

## 2012-01-08 ENCOUNTER — Encounter (HOSPITAL_BASED_OUTPATIENT_CLINIC_OR_DEPARTMENT_OTHER): Payer: Federal, State, Local not specified - PPO | Admitting: Oncology

## 2012-01-08 ENCOUNTER — Encounter (HOSPITAL_COMMUNITY)
Admission: RE | Admit: 2012-01-08 | Discharge: 2012-01-08 | Disposition: A | Discharge status: Home / Self Care | Payer: Federal, State, Local not specified - PPO | Source: Ambulatory Visit | Attending: Dentistry | Admitting: Dentistry

## 2012-01-08 ENCOUNTER — Encounter (HOSPITAL_COMMUNITY): Payer: Self-pay

## 2012-01-08 VITALS — BP 103/67 | HR 79 | Temp 98.3°F | Wt 157.5 lb

## 2012-01-08 DIAGNOSIS — B009 Herpesviral infection, unspecified: Secondary | ICD-10-CM

## 2012-01-08 DIAGNOSIS — C099 Malignant neoplasm of tonsil, unspecified: Secondary | ICD-10-CM

## 2012-01-08 DIAGNOSIS — B977 Papillomavirus as the cause of diseases classified elsewhere: Secondary | ICD-10-CM

## 2012-01-08 HISTORY — DX: Herpesviral infection, unspecified: B00.9

## 2012-01-08 HISTORY — DX: Gastro-esophageal reflux disease without esophagitis: K21.9

## 2012-01-08 LAB — CBC
HCT: 34.2 % — ABNORMAL LOW (ref 39.0–52.0)
Hemoglobin: 11.4 g/dL — ABNORMAL LOW (ref 13.0–17.0)
MCH: 30.6 pg (ref 26.0–34.0)
MCHC: 33.3 g/dL (ref 30.0–36.0)
MCV: 91.9 fL (ref 78.0–100.0)
RBC: 3.72 MIL/uL — ABNORMAL LOW (ref 4.22–5.81)

## 2012-01-08 LAB — BASIC METABOLIC PANEL
BUN: 18 mg/dL (ref 6–23)
CO2: 31 mEq/L (ref 19–32)
Calcium: 8.7 mg/dL (ref 8.4–10.5)
Creatinine, Ser: 1.2 mg/dL (ref 0.50–1.35)
GFR calc non Af Amer: 58 mL/min — ABNORMAL LOW (ref >90)
Glucose, Bld: 96 mg/dL (ref 70–99)
Sodium: 135 mEq/L (ref 135–145)

## 2012-01-08 MED ORDER — ACYCLOVIR 400 MG PO TABS: 400.0000 mg | ORAL_TABLET | Freq: Two times a day (BID) | ORAL | Status: AC

## 2012-01-08 NOTE — Telephone Encounter (Signed)
Mazzie from the [facility] called this morning wanting to get this pt in ASAP for a peg placement. Our first appointment is in July. The pt is having all his teeth pull out. Can we use a URGENT spot.

## 2012-01-08 NOTE — Patient Instructions (Addendum)
[facility]  Discharge Instructions  RECOMMENDATIONS MADE BY THE CONSULTANT AND ANY TEST RESULTS WILL BE SENT TO YOUR REFERRING DOCTOR.   MEDICATIONS PRESCRIBED: Acyclovir 400mg  twice a day.   Follow label directions  INSTRUCTIONS GIVEN AND DISCUSSED:  Please call us after speaking to radiation to inform us of your start date for radiation.    SPECIAL INSTRUCTIONS/FOLLOW-UP: Return to Clinic in 3 weeks.  We are referring you to Dr. Jena Gauss for PEG tube placement.  Please see the front desk as you leave for your appointments.     I acknowledge that I have been informed and understand all the instructions given to me and received a copy. I do not have any more questions at this time, but understand that I may call the Specialty Clinic at [facility] at [phone] during business hours should I have any further questions or need assistance in obtaining follow-up care.    __________________________________________  _____________  __________ Signature of Patient or Authorized Representative            Date                   Time    __________________________________________ Nurse's Signature

## 2012-01-08 NOTE — Telephone Encounter (Signed)
I got him coming in on Thursday at 1:00 to see LSL

## 2012-01-08 NOTE — Pre-Procedure Instructions (Signed)
Per EPIC- Dr Kristin Bruins has reviewed abnormal CBC

## 2012-01-08 NOTE — Progress Notes (Signed)
[name], MD [address] [place] [address]  1. Primary squamous cell carcinoma of tonsil     CURRENT THERAPY:S/P 2 cycle of Cisplatin Day 1 and 5-FU continuous infusion days 1-4 every 21 days and weekly cetuximab.  INTERVAL HISTORY: Austin Mathis 71 y.o. male returns for  regular  visit for followup of  bilateral neck adenopathy from a primary in the tonsil on the best 4.3 x 2.7 cm in size. His biopsy showed squamous cell carcinoma consistent with a tonsillar primary with bilateral cervical adenopathy and the PET scan showed no distant disease. 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  He is due to have teeth extraction by Dr. Kristin Bruins and the patient is here today to see how he tolerated his last cycle of chemotherapy and pre-operative physical for teeth extraction.   Today the patient reports lesions/sores of his lips and he reports it is much improved.  They began on Tuesday and his mouth and tongue were so sore that he could not eat much.  Today, they are much improved but are still appreciated on inspection.    We will refer the patient to Dr. Jena Gauss for PEG tube placement in preparation for his chemoradiation.    He explains that he tolerated therapy well thus far.  He denies any nausea or vomiting.  His ECOG status remains at a 1.  His vitals today are stable and his physical exam today is benign.  His neck adenopathy is not appreciated today, evidence of his cancer responding to chemotherapy.    He is due for tooth extraction on Wednesday.  Dr. Mariel Sleet will call Dr. Kristin Bruins to inform his of his herpetic breakout.   Otherwise, the patient is doing well and is prepared for surgery on Wednesday as scheduled.   Dr. Mariel Sleet spoke with Dr. Roselind Messier this AM and radiation oncology will contact the patient with a follow-up appointment in preparation for radiation.  It is anticipated that he will be able to start radiation 3 weeks after tooth extraction.  We have asked  the patient to contact us when he is aware of his radiation start date so we can schedule him for weekly cisplatin.   Past Medical History  Diagnosis Date  . Hypertension   . High cholesterol   . Tonsillar cancer   . Hearing deficit     Right greater than left.  . 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      has no known allergies.  Mr. Robicheaux does not currently have medications on file.  Past Surgical History  Procedure Date  . Placement of port a cath   . Portacath placement 11/24/2011    Procedure: INSERTION PORT-A-CATH;  Surgeon: Marlane Hatcher, MD;  Location: [facility];  Service: General;  Laterality: Left;    Denies any headaches, dizziness, double vision, fevers, chills, night sweats, nausea, vomiting, diarrhea, constipation, chest pain, heart palpitations, shortness of breath, blood in stool, black tarry stool, urinary pain, urinary burning, urinary frequency, hematuria.   PHYSICAL EXAMINATION  ECOG PERFORMANCE STATUS: 1 - Symptomatic but completely ambulatory  Filed Vitals:   01/08/12 0939  BP: 103/67  Pulse: 79  Temp: 98.3 F (36.8 C)    GENERAL:alert, no distress,  well nourished, well developed, comfortable, cooperative and smiling SKIN: skin color, texture, turgor are normal, no rashes or significant lesions HEAD: Normocephalic, No masses, lesions, tenderness or abnormalities EYES: normal, Conjunctiva are pink and non-injected EARS: External ears normal OROPHARYNX:herpetic lesions appreciated on lips  NECK: supple, no adenopathy, no stridor, non-tender, trachea midline LYMPH:  no palpable lymphadenopathy BREAST:not examined LUNGS: clear to auscultation and percussion HEART: regular  rate & rhythm, no murmurs, no gallops, S1 normal and S2 normal ABDOMEN:abdomen soft, non-tender and normal bowel sounds BACK: Back symmetric, no curvature. EXTREMITIES:less then 2 second capillary refill, no joint deformities, effusion, or inflammation, no edema, no skin discoloration, no clubbing, no cyanosis  NEURO: alert & oriented x 3 with fluent speech, no focal motor/sensory deficits, gait normal    LABORATORY DATA: CBC    Component Value Date/Time   WBC 11.0* 12/19/2011 0921   RBC 3.74* 12/19/2011 0921   HGB 11.7* 12/19/2011 0921   HCT 34.3* 12/19/2011 0921   PLT 285 12/19/2011 0921   MCV 91.7 12/19/2011 0921   MCH 31.3 12/19/2011 0921   MCHC 34.1 12/19/2011 0921   RDW 13.6 12/19/2011 0921   LYMPHSABS 1.1 12/19/2011 0921   MONOABS 1.1* 12/19/2011 0921   EOSABS 0.1 12/19/2011 0921   BASOSABS 0.0 12/19/2011 0921      Chemistry      Component Value Date/Time   NA 135 12/28/2011 1035   K 3.8 12/28/2011 1035   CL 95* 12/28/2011 1035   CO2 30 12/28/2011 1035   BUN 35* 12/28/2011 1035   CREATININE 1.55* 12/28/2011 1035      Component Value Date/Time   CALCIUM 9.0 12/28/2011 1035   ALKPHOS 66 12/19/2011 0916   AST 12 12/19/2011 0916   ALT 9 12/19/2011 0916   BILITOT 0.1* 12/19/2011 0916        PATHOLOGY: Right tonsillar mass, invasive squamous cell carcinoma, HPV+, strong p16 immunohistochemical staining.     ASSESSMENT:  1. Bilateral neck adenopathy from a primary in the tonsil on the best 4.3 x 2.7 cm in size. His biopsy showed squamous cell carcinoma consistent with a tonsillar primary with bilateral cervical adenopathy and the PET scan showed no distant disease. 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  2.  Herpetic lesions of the lips     PLAN:  1. Referral to GI for PEG tube placement ASAP.  2. Acyclovir 400 mg BID #60 with 1 refill for prophylaxis.   3. Patient discussed with Dr. Roselind Messier.  We anticipate embarking on chemoradiation 3 weeks following tooth extraction.  4.  Patient cleared for tooth extraction 5. Patient education regarding chemoradiation consisting of weekly cisplatin.  6. Return 3 weeks from this Wednesday (01/10/2012)   All questions were answered. The patient knows to call the clinic with any problems, questions or concerns. We can certainly see the patient much sooner if necessary.  The patient and plan discussed with Glenford Peers, MD and he is in agreement with the aforementioned.  Patient seen and examined by Dr. Mariel Sleet as well.    Haston Casebolt

## 2012-01-08 NOTE — Telephone Encounter (Signed)
3pm tomorrow Urgent

## 2012-01-08 NOTE — Patient Instructions (Addendum)
[address]  01/08/2012   Your procedure is scheduled on:  01/10/12  Surgery 0830-1030  [facility]  Report to [facility] at    0630  AM.  Call this number if you have problems the morning of surgery: [phone]     Or PST   [id number]  [facility]   Remember:   Do not eat food: or drink any fluids After Midnight. Tuesday NIGHT     Take these medicines the morning of surgery with A SIP OF WATER:none                     MAY TAKE COMPAZINE IF NEEDED   Do not wear jewelry, make-up or nail polish.  Do not wear lotions, powders, or perfumes. You may wear deodorant.  Do not shave 48 hours prior to surgery.  Do not bring valuables to the hospital.  Contacts, dentures or bridgework may not be worn into surgery.  Leave suitcase in the car. After surgery it may be brought to your room.  For patients admitted to the hospital, checkout time is 11:00 AM the day of discharge.   Patients discharged the day of surgery will not be allowed to drive home.  Name and phone number of your driver:    wife                                                                  Special Instructions: CHG Shower Use Special Wash: 1/2 bottle night before surgery and 1/2 bottle morning of surgery. REGULAR SOAP FACE AND PRIVATES                          MEN-MAY SHAVE FACE MORNING OF SURGERY  Please read over the following fact sheets that you were given: MRSA Information

## 2012-01-10 ENCOUNTER — Encounter (HOSPITAL_COMMUNITY): Payer: Self-pay | Admitting: Anesthesiology

## 2012-01-10 ENCOUNTER — Ambulatory Visit (HOSPITAL_COMMUNITY): Payer: Federal, State, Local not specified - PPO | Admitting: Anesthesiology

## 2012-01-10 ENCOUNTER — Ambulatory Visit (HOSPITAL_COMMUNITY)
Admission: RE | Admit: 2012-01-10 | Discharge: 2012-01-10 | Disposition: A | Discharge status: Home / Self Care | Payer: Federal, State, Local not specified - PPO | Source: Ambulatory Visit | Attending: Dentistry | Admitting: Dentistry

## 2012-01-10 ENCOUNTER — Encounter (HOSPITAL_COMMUNITY)
Admission: RE | Disposition: A | Discharge status: Home / Self Care | Payer: Self-pay | Source: Ambulatory Visit | Attending: Dentistry

## 2012-01-10 ENCOUNTER — Encounter (HOSPITAL_COMMUNITY): Payer: Self-pay | Admitting: *Deleted

## 2012-01-10 DIAGNOSIS — K045 Chronic apical periodontitis: Secondary | ICD-10-CM

## 2012-01-10 DIAGNOSIS — K0530 Chronic periodontitis, unspecified: Secondary | ICD-10-CM

## 2012-01-10 DIAGNOSIS — K029 Dental caries, unspecified: Secondary | ICD-10-CM | POA: Diagnosis present

## 2012-01-10 DIAGNOSIS — K083 Retained dental root: Secondary | ICD-10-CM | POA: Diagnosis present

## 2012-01-10 DIAGNOSIS — I10 Essential (primary) hypertension: Secondary | ICD-10-CM | POA: Insufficient documentation

## 2012-01-10 DIAGNOSIS — Z01812 Encounter for preprocedural laboratory examination: Secondary | ICD-10-CM | POA: Insufficient documentation

## 2012-01-10 DIAGNOSIS — C099 Malignant neoplasm of tonsil, unspecified: Secondary | ICD-10-CM

## 2012-01-10 DIAGNOSIS — E7800 Pure hypercholesterolemia, unspecified: Secondary | ICD-10-CM | POA: Insufficient documentation

## 2012-01-10 HISTORY — PX: MULTIPLE EXTRACTIONS WITH ALVEOLOPLASTY: SHX5342

## 2012-01-10 SURGERY — MULTIPLE EXTRACTION WITH ALVEOLOPLASTY
Anesthesia: General | Site: Mouth | Wound class: Clean Contaminated

## 2012-01-10 MED ORDER — KETAMINE HCL 10 MG/ML IJ SOLN
INTRAMUSCULAR | Status: DC | PRN
  Administered 2012-01-10 (×5): 10 mg via INTRAVENOUS

## 2012-01-10 MED ORDER — DEXAMETHASONE SODIUM PHOSPHATE 4 MG/ML IJ SOLN
INTRAMUSCULAR | Status: DC | PRN
  Administered 2012-01-10: 10 mg via INTRAVENOUS

## 2012-01-10 MED ORDER — LIDOCAINE HCL (CARDIAC) 20 MG/ML IV SOLN
INTRAVENOUS | Status: DC | PRN
  Administered 2012-01-10: 50 mg via INTRAVENOUS

## 2012-01-10 MED ORDER — CEFAZOLIN SODIUM-DEXTROSE 2-3 GM-% IV SOLR
INTRAVENOUS | Status: AC
  Filled 2012-01-10: qty 50

## 2012-01-10 MED ORDER — PHENYLEPHRINE HCL 10 MG/ML IJ SOLN
INTRAMUSCULAR | Status: DC | PRN
  Administered 2012-01-10 (×2): 80 ug via INTRAVENOUS
  Administered 2012-01-10: 120 ug via INTRAVENOUS
  Administered 2012-01-10: 40 ug via INTRAVENOUS
  Administered 2012-01-10: 80 ug via INTRAVENOUS
  Administered 2012-01-10: 40 ug via INTRAVENOUS
  Administered 2012-01-10: 80 ug via INTRAVENOUS
  Administered 2012-01-10: 40 ug via INTRAVENOUS
  Administered 2012-01-10 (×2): 80 ug via INTRAVENOUS

## 2012-01-10 MED ORDER — LIDOCAINE-EPINEPHRINE 2 %-1:100000 IJ SOLN
INTRAMUSCULAR | Status: AC
  Filled 2012-01-10: qty 6.8

## 2012-01-10 MED ORDER — CEFAZOLIN SODIUM-DEXTROSE 2-3 GM-% IV SOLR
2.0000 g | INTRAVENOUS | Status: AC
  Administered 2012-01-10: 2 g via INTRAVENOUS

## 2012-01-10 MED ORDER — OXYMETAZOLINE HCL 0.05 % NA SOLN
NASAL | Status: AC
  Filled 2012-01-10: qty 15

## 2012-01-10 MED ORDER — BUPIVACAINE-EPINEPHRINE PF 0.5-1:200000 % IJ SOLN
INTRAMUSCULAR | Status: AC
  Filled 2012-01-10: qty 7.2

## 2012-01-10 MED ORDER — PROMETHAZINE HCL 25 MG/ML IJ SOLN: 6.2500 mg | INTRAMUSCULAR | Status: DC | PRN

## 2012-01-10 MED ORDER — ISOPROPYL ALCOHOL 70 % SOLN
Status: DC | PRN
  Administered 2012-01-10: 1 via TOPICAL

## 2012-01-10 MED ORDER — FENTANYL CITRATE 0.05 MG/ML IJ SOLN: 25.0000 ug | INTRAMUSCULAR | Status: DC | PRN

## 2012-01-10 MED ORDER — PROPOFOL 10 MG/ML IV EMUL
INTRAVENOUS | Status: DC | PRN
  Administered 2012-01-10: 150 mg via INTRAVENOUS

## 2012-01-10 MED ORDER — LACTATED RINGERS IV SOLN: INTRAVENOUS | Status: DC

## 2012-01-10 MED ORDER — ACETAMINOPHEN 10 MG/ML IV SOLN
INTRAVENOUS | Status: DC | PRN
  Administered 2012-01-10: 1000 mg via INTRAVENOUS

## 2012-01-10 MED ORDER — ACETAMINOPHEN 10 MG/ML IV SOLN
INTRAVENOUS | Status: AC
  Filled 2012-01-10: qty 100

## 2012-01-10 MED ORDER — NEOSTIGMINE METHYLSULFATE 1 MG/ML IJ SOLN
INTRAMUSCULAR | Status: DC | PRN
  Administered 2012-01-10: 3 mg via INTRAVENOUS

## 2012-01-10 MED ORDER — BACITRACIN-NEOMYCIN-POLYMYXIN OINTMENT TUBE
TOPICAL_OINTMENT | CUTANEOUS | Status: DC | PRN
  Administered 2012-01-10: 1 via TOPICAL

## 2012-01-10 MED ORDER — OXYMETAZOLINE HCL 0.05 % NA SOLN
NASAL | Status: DC | PRN
  Administered 2012-01-10: 2 via NASAL
  Administered 2012-01-10: 1 via NASAL

## 2012-01-10 MED ORDER — HYDROMORPHONE HCL PF 1 MG/ML IJ SOLN
INTRAMUSCULAR | Status: DC | PRN
  Administered 2012-01-10 (×4): 0.5 mg via INTRAVENOUS

## 2012-01-10 MED ORDER — LIDOCAINE-EPINEPHRINE 2 %-1:100000 IJ SOLN
INTRAMUSCULAR | Status: DC | PRN
  Administered 2012-01-10: 1.7 mL

## 2012-01-10 MED ORDER — GLYCOPYRROLATE 0.2 MG/ML IJ SOLN
INTRAMUSCULAR | Status: DC | PRN
  Administered 2012-01-10: 0.3 mg via INTRAVENOUS

## 2012-01-10 MED ORDER — FENTANYL CITRATE 0.05 MG/ML IJ SOLN
INTRAMUSCULAR | Status: DC | PRN
  Administered 2012-01-10 (×5): 50 ug via INTRAVENOUS

## 2012-01-10 MED ORDER — BACITRACIN-NEOMYCIN-POLYMYXIN 400-5-5000 EX OINT
TOPICAL_OINTMENT | CUTANEOUS | Status: AC
  Filled 2012-01-10: qty 1

## 2012-01-10 MED ORDER — METOCLOPRAMIDE HCL 5 MG/ML IJ SOLN
INTRAMUSCULAR | Status: DC | PRN
  Administered 2012-01-10 (×2): 5 mg via INTRAVENOUS

## 2012-01-10 MED ORDER — OXYCODONE-ACETAMINOPHEN 5-325 MG PO TABS: ORAL_TABLET | ORAL | Status: AC

## 2012-01-10 MED ORDER — LACTATED RINGERS IV SOLN
INTRAVENOUS | Status: DC | PRN
  Administered 2012-01-10 (×2): via INTRAVENOUS

## 2012-01-10 MED ORDER — BUPIVACAINE-EPINEPHRINE PF 0.5-1:200000 % IJ SOLN
INTRAMUSCULAR | Status: DC | PRN
  Administered 2012-01-10: 1.8 mL

## 2012-01-10 MED ORDER — ONDANSETRON HCL 4 MG/2ML IJ SOLN
INTRAMUSCULAR | Status: DC | PRN
  Administered 2012-01-10: 4 mg via INTRAVENOUS

## 2012-01-10 MED ORDER — ROCURONIUM BROMIDE 100 MG/10ML IV SOLN
INTRAVENOUS | Status: DC | PRN
  Administered 2012-01-10: 50 mg via INTRAVENOUS

## 2012-01-10 SURGICAL SUPPLY — 24 items
ATTRACTOMAT 16X20 MAGNETIC DRP (DRAPES) ×2 IMPLANT
BAG SPEC THK2 15X12 ZIP CLS (MISCELLANEOUS) ×1
BAG ZIPLOCK 12X15 (MISCELLANEOUS) ×2 IMPLANT
BLADE SURG 15 STRL LF DISP TIS (BLADE) ×2 IMPLANT
BLADE SURG 15 STRL SS (BLADE) ×4
CLOTH BEACON ORANGE TIMEOUT ST (SAFETY) ×2 IMPLANT
GAUZE SPONGE 4X4 16PLY XRAY LF (GAUZE/BANDAGES/DRESSINGS) ×2 IMPLANT
GLOVE SURG ORTHO 8.0 STRL STRW (GLOVE) ×2 IMPLANT
GLOVE SURG SS PI 6.5 STRL IVOR (GLOVE) ×2 IMPLANT
GOWN STRL REIN 3XL LVL4 (GOWN DISPOSABLE) ×1 IMPLANT
KIT BASIN OR (CUSTOM PROCEDURE TRAY) ×2 IMPLANT
NS IRRIG 1000ML POUR BTL (IV SOLUTION) ×2 IMPLANT
PACK EENT SPLIT (PACKS) ×2 IMPLANT
PACKING VAGINAL (PACKING) ×2 IMPLANT
PAD EYE OVAL STERILE LF (GAUZE/BANDAGES/DRESSINGS) ×1 IMPLANT
SPONGE GAUZE 4X4 12PLY (GAUZE/BANDAGES/DRESSINGS) ×2 IMPLANT
SUCTION FRAZIER 12FR DISP (SUCTIONS) ×2 IMPLANT
SUT CHROMIC 3 0 PS 2 (SUTURE) ×8 IMPLANT
SUT CHROMIC 4 0 P 3 18 (SUTURE) IMPLANT
SYR 50ML LL SCALE MARK (SYRINGE) ×2 IMPLANT
TUBING CONNECTING 10 (TUBING) ×2 IMPLANT
VESSEL CANN W0 1 W VA 30003 (MISCELLANEOUS) ×2 IMPLANT
WATER STERILE IRR 1500ML POUR (IV SOLUTION) ×2 IMPLANT
YANKAUER SUCT BULB TIP NO VENT (SUCTIONS) ×2 IMPLANT

## 2012-01-10 NOTE — Progress Notes (Signed)
Pt has a loose cough. Very scant amt serous drainage from gums

## 2012-01-10 NOTE — Op Note (Signed)
Patient:            Austin Mathis Date of Birth:  1941/01/24 MRN:                [id number]   DATE OF PROCEDURE:  01/10/2012               OPERATIVE REPORT   PREOPERATIVE DIAGNOSES: 1. Squamous cell carcinoma of the tonsil  2. Preradiation therapy dental protocol 3. Chronic apical periodontitis  4. Chronic periodontitis 5. Multiple retained root segments  POSTOPERATIVE DIAGNOSES: 1. Squamous cell carcinoma of the tonsil  2. Preradiation therapy dental protocol 3. Chronic apical periodontitis  4. Chronic periodontitis 5. Multiple retained root segments  OPERATIONS: 1. Multiple extraction of tooth numbers 1, 3, 4, 5, 6, 7, 8, 9, 10, 11, 12, 13, 14, 15, 16, 17, 18, 20, 21, 22, 23, 24, 25, 26, 27, 28, 29, and 30. 2. Four Quadrants of alveoloplasty  SURGEON: Charlynne Pander, DDS  ASSISTANT: Zettie Pho, (dental assistant)  ANESTHESIA: General anesthesia via nasoendotracheal tube.  MEDICATIONS: 1. Ancef 2 g IV prior to invasive dental procedures. 2. Local anesthesia with a total utilization of 6 carpules each containing 34 mg of lidocaine with 0.017 mg of epinephrine as well as 3 carpules each containing 9 mg of bupivacaine with 0.009 mg of epinephrine.  SPECIMENS: There are 28 teeth that were discarded.  DRAINS: None  CULTURES: None  COMPLICATIONS: None   ESTIMATED BLOOD LOSS: 100 mLs.  INTRAVENOUS FLUIDS: 1700 mLs of Lactated ringers solution.  INDICATIONS: The patient was recently diagnosed with squamous cell carcinoma of the tonsil.  A dental consultation was then requested to evaluate dentition and to rule out dental infection that may affect the patient's systemic health while undergoing radiation therapy as well to prevent future complications such as osteoradionecrosis.  The patient was examined and treatment planned for extraction of remaining teeth with alveoloplasty and pre-prosthetic surgery as indicated.    OPERATIVE FINDINGS: Patient was  examined operating room number 12.  The teeth were identified for extraction. The patient was noted be affected by chronic periodontitis, chronic apical periodontitis, multiple retained root segments, and dental caries.   DESCRIPTION OF PROCEDURE: Patient was brought to the main operating room number 12. Patient was then placed in the supine position on the operating table. General Anesthesia was then induced per the anesthesia team. The patient was then prepped and draped in the usual manner for dental medicine procedure. A timeout was performed. The patient was identified and procedures were verified. A throat pack was placed at this time. The oral cavity was then thoroughly examined with the findings noted above. The patient was then ready for dental medicine procedure as follows:  Local anesthesia was then administered sequentially with a total utilization of 6 carpules each containing 34 mg of lidocaine with 0.017 mg of epinephrine as well as 3 carpules  each containing 9 mg bupivacaine with 0.009 mg of epinephrine.  The Maxillary left and right quadrants first approached. Anesthesia was then delivered utilizing infiltration with lidocaine with epinephrine. A #15 blade incision was then made from the maxillary right tuberosity and extended to the maxillary left tuberosity.  A  surgical flap was then carefully reflected. Appropriate amounts of buccal and interseptal bone were then removed utilizing a surgical handpiece and bur and copious amounts of sterile water.  The teeth were then subluxated with a series of straight elevators. Tooth numbers 4, 5, 6, 7, 8, 9, 10, 11, 12, 13 were  then removed with a 150 forceps without complications. Tooth numbers 1, and 3 were then removed with a 53R forceps without complications. The retained roots in the area tooth numbers 14 and 15 were then removed with a rongeur. Tooth #16 was then removed with a 53L forceps without complications. Alveoloplasty was then  performed utilizing a ronguers and bone file. The surgical site was then irrigated with copious amounts of sterile saline. The tissues were approximated and trimmed appropriately.The surgical site was then closed from the maxillary right tuberosity and extended the mesial of #8 utilizing 3-0 chromic gut suture in a continuous interrupted suture technique x1. The maxillary left surgical site was then closed from the maxillary left tuberosity and extended to the mesial #9 utilizing 3-O chromic gut sutures in a continuous interrupted suture technique x1 .  At this point time, the mandibular quadrants were approached. The patient was given bilateral inferior alveolar nerve blocks and long buccal nerve blocks utilizing the bupivacaine with epinephrine. Further infiltration was then achieved utilizing the lidocaine with epinephrine. A 15 blade incision was then made from the distal of number #17 and extended to the distal of #32 .  A surgical flap was then carefully reflected. Appropriate amounts of buccal and interseptal bone were then removed appropriately. Tooth numbers 17,18 were then removed with a 23 forceps without complications.   Tooth numbers 20, 21, 22, 23, 24, 25, 26, 27, 28, and 29 were then removed with a 151 forceps without complications. Tooth #30 was then removed with a 23 forceps without complications. Alveoloplasty was then performed utilizing a rongeurs and bone file. The tissues were approximated and trimmed appropriately. The surgical sites were then irrigated with copious amounts of sterile saline. The surgical site was then closed from the distal of 17 and extended the mesial numbers 24 utilizing 3-0 chromic gut suture in a continuous interrupted suture technique x1. The mandibular right surgical site was then closed from the distal of #32 and extended the mesial of #27 utilizing 3-0 chromic gut suture in a continuous interrupted suture technique x1. 4 single interrupted sutures were then placed  to further closed surgical site from the area of #25-27 appropriately.   At this point time, the entire mouth was irrigated with copious amounts of sterile saline. The patient was exam for complications, seeing none, the dental medicine procedure was deemed to be complete. The throat pack was removed at this time. A series of 4 x 4 gauze were placed in the mouth to aid hemostasis. The patient was then handed over to the anesthesia team for final disposition. After an appropriate amount of time, the patient was extubated and taken to the postanesthsia care unit with stable vital signs and a good condition. All counts were correct for the dental medicine procedure.    Charlynne Pander, DDS.

## 2012-01-10 NOTE — Progress Notes (Signed)
Patient's wife called Dr. Luretha Murphy office re: what was done with his gold tooth

## 2012-01-10 NOTE — Preoperative (Signed)
Beta Blockers   Reason not to administer Beta Blockers:Not Applicable, not on home bb 

## 2012-01-10 NOTE — H&P (Signed)
See H and P provided by Dellis Anes, PA for Dr. Mariel Sleet for use for dental procedures in OR. (see below)  Dr. Kristin Bruins      1.  Primary squamous cell carcinoma of tonsil         CURRENT THERAPY:S/P 2 cycle of Cisplatin Day 1 and 5-FU continuous infusion days 1-4 every 21 days and weekly cetuximab.   INTERVAL HISTORY: Austin Mathis 71 y.o. male returns for  regular  visit for followup of  bilateral neck adenopathy from a primary in the tonsil on the best 4.3 x 2.7 cm in size. His biopsy showed squamous cell carcinoma consistent with a tonsillar primary with bilateral cervical adenopathy and the PET scan showed no distant disease. 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   He is due to have teeth extraction by Dr. Kristin Bruins and the patient is here today to see how he tolerated his last cycle of chemotherapy and pre-operative physical for teeth extraction.    Today the patient reports lesions/sores of his lips and he reports it is much improved.  They began on Tuesday and his mouth and tongue were so sore that he could not eat much.  Today, they are much improved but are still appreciated on inspection.     We will refer the patient to Dr. Jena Gauss for PEG tube placement in preparation for his chemoradiation.     He explains that he tolerated therapy well thus far.  He denies any nausea or vomiting.  His ECOG status remains at a 1.  His vitals today are stable and his physical exam today is benign.  His neck adenopathy is not appreciated today, evidence of his cancer responding to chemotherapy.     He is due for tooth extraction on Wednesday.  Dr. Mariel Sleet will call Dr. Kristin Bruins to inform his of his herpetic breakout.    Otherwise, the patient is doing well and is prepared for surgery on Wednesday as scheduled.    Dr. Mariel Sleet spoke with Dr. Roselind Messier this AM and radiation oncology will contact the patient with a follow-up appointment in preparation for radiation.  It is  anticipated that he will be able to start radiation 3 weeks after tooth extraction.  We have asked the patient to contact us when he is aware of his radiation start date so we can schedule him for weekly cisplatin.     Past Medical History   Diagnosis  Date   .  Hypertension     .  High cholesterol     .  Tonsillar cancer     .  Hearing deficit         Right greater than left.   .  Squamous cell carcinoma of tonsil  11/16/2011   .  Primary squamous cell carcinoma of tonsil  11/16/2011   .  Primary squamous cell carcinoma of tonsil  11/16/2011   .  Primary squamous cell carcinoma of tonsil  11/16/2011        has Primary squamous cell carcinoma of tonsil; Hypertension; and Hypercholesterolemia on his problem list.       has no known allergies.   Austin Mathis does not currently have medications on file.    Past Surgical History   Procedure  Date   .  Placement of port a cath     .  Portacath placement  11/24/2011       Procedure: INSERTION PORT-A-CATH;  Surgeon: Marlane Hatcher, MD;  Location: [facility];  Service: General;  Laterality: Left;        Denies any headaches, dizziness, double vision, fevers, chills, night sweats, nausea, vomiting, diarrhea, constipation, chest pain, heart palpitations, shortness of breath, blood in stool, black tarry stool, urinary pain, urinary burning, urinary frequency, hematuria.    PHYSICAL EXAMINATION   ECOG PERFORMANCE STATUS: 1 - Symptomatic but completely ambulatory    Filed Vitals:     01/08/12 0939   BP:  103/67   Pulse:  79   Temp:  98.3 F (36.8 C)        GENERAL:alert, no distress, well nourished, well developed, comfortable, cooperative and smiling SKIN: skin color, texture, turgor are normal, no rashes or significant lesions HEAD: Normocephalic, No masses, lesions, tenderness or abnormalities EYES: normal, Conjunctiva are pink and non-injected EARS: External ears normal OROPHARYNX:herpetic lesions appreciated on lips   NECK:  supple, no adenopathy, no stridor, non-tender, trachea midline LYMPH:  no palpable lymphadenopathy BREAST:not examined LUNGS: clear to auscultation and percussion HEART: regular rate & rhythm, no murmurs, no gallops, S1 normal and S2 normal ABDOMEN:abdomen soft, non-tender and normal bowel sounds BACK: Back symmetric, no curvature. EXTREMITIES:less then 2 second capillary refill, no joint deformities, effusion, or inflammation, no edema, no skin discoloration, no clubbing, no cyanosis   NEURO: alert & oriented x 3 with fluent speech, no focal motor/sensory deficits, gait normal       LABORATORY DATA: CBC     Component  Value  Date/Time     WBC  11.0*  12/19/2011 0921     RBC  3.74*  12/19/2011 0921     HGB  11.7*  12/19/2011 0921     HCT  34.3*  12/19/2011 0921     PLT  285  12/19/2011 0921     MCV  91.7  12/19/2011 0921     MCH  31.3  12/19/2011 0921     MCHC  34.1  12/19/2011 0921     RDW  13.6  12/19/2011 0921     LYMPHSABS  1.1  12/19/2011 0921     MONOABS  1.1*  12/19/2011 0921     EOSABS  0.1  12/19/2011 0921     BASOSABS  0.0  12/19/2011 0921           Chemistry         Component  Value  Date/Time     NA  135  12/28/2011 1035     K  3.8  12/28/2011 1035     CL  95*  12/28/2011 1035     CO2  30  12/28/2011 1035     BUN  35*  12/28/2011 1035     CREATININE  1.55*  12/28/2011 1035          Component  Value  Date/Time     CALCIUM  9.0  12/28/2011 1035     ALKPHOS  66  12/19/2011 0916     AST  12  12/19/2011 0916     ALT  9  12/19/2011 0916     BILITOT  0.1*  12/19/2011 0916                PATHOLOGY: Right tonsillar mass, invasive squamous cell carcinoma, HPV+, strong p16 immunohistochemical staining.      ASSESSMENT:   1. Bilateral neck adenopathy from a primary in the tonsil on the best 4.3 x 2.7 cm in size. His biopsy showed squamous cell carcinoma consistent with a tonsillar primary with bilateral cervical adenopathy  and the PET scan showed no distant disease. 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   2.  Herpetic lesions of the lips        PLAN:   1. Referral to GI for PEG tube placement ASAP.   2. Acyclovir 400 mg BID #60 with 1 refill for prophylaxis.    3. Patient discussed with Dr. Roselind Messier.  We anticipate embarking on chemoradiation 3 weeks following tooth extraction.   4. Patient cleared for tooth extraction 5. Patient education regarding chemoradiation consisting of weekly cisplatin.   6. Return 3 weeks from this Wednesday (01/10/2012)     All questions were answered. The patient knows to call the clinic with any problems, questions or concerns. We can certainly see the patient much sooner if necessary.   The patient and plan discussed with Glenford Peers, MD and he is in agreement with the aforementioned.  Patient seen and examined by Dr. Mariel Sleet as well.      KEFALAS,THOMAS

## 2012-01-10 NOTE — Discharge Instructions (Signed)

## 2012-01-10 NOTE — Transfer of Care (Signed)
Immediate Anesthesia Transfer of Care Note  Patient: Austin Mathis  Procedure(s) Performed: Procedure(s) (LRB): MULTIPLE EXTRACION WITH ALVEOLOPLASTY (N/A)  Patient Location: PACU  Anesthesia Type: General  Level of Consciousness: awake, alert , patient cooperative and responds to stimulation  Airway & Oxygen Therapy: Patient Spontanous Breathing and Patient connected to face mask oxygen  Post-op Assessment: Report given to PACU RN, Post -op Vital signs reviewed and stable and Patient moving all extremities  Post vital signs: Reviewed and stable  Complications: No apparent anesthesia complications

## 2012-01-10 NOTE — Anesthesia Preprocedure Evaluation (Addendum)
Anesthesia Evaluation  Patient identified by MRN, date of birth, ID band Patient awake    Reviewed: Allergy & Precautions, H&P , NPO status , Patient's Chart, lab work & pertinent test results  Airway Mallampati: III TM Distance: >3 FB Neck ROM: Full  Mouth opening: Limited Mouth Opening  Dental  (+) Teeth Intact, Poor Dentition and Dental Advisory Given   Pulmonary neg pulmonary ROS,  breath sounds clear to auscultation  Pulmonary exam normal       Cardiovascular hypertension, Pt. on medications negative cardio ROS  Rhythm:Regular Rate:Normal     Neuro/Psych negative neurological ROS  negative psych ROS   GI/Hepatic negative GI ROS, Neg liver ROS, GERD-  Medicated,Sq Cell CA of tonsil   Endo/Other  negative endocrine ROS  Renal/GU Renal InsufficiencyRenal diseasenegative Renal ROS  negative genitourinary   Musculoskeletal negative musculoskeletal ROS (+)   Abdominal   Peds  Hematology negative hematology ROS (+)   Anesthesia Other Findings   Reproductive/Obstetrics negative OB ROS                          Anesthesia Physical Anesthesia Plan  ASA: III  Anesthesia Plan: General   Post-op Pain Management:    Induction: Intravenous  Airway Management Planned: Nasal ETT  Additional Equipment:   Intra-op Plan:   Post-operative Plan: Extubation in OR  Informed Consent: I have reviewed the patients History and Physical, chart, labs and discussed the procedure including the risks, benefits and alternatives for the proposed anesthesia with the patient or authorized representative who has indicated his/her understanding and acceptance.   Dental advisory given  Plan Discussed with: CRNA  Anesthesia Plan Comments:         Anesthesia Quick Evaluation

## 2012-01-10 NOTE — Progress Notes (Signed)
01/10/2012  Patient:            Austin Mathis Date of Birth:  06/21/1941 MRN:                [id number]  Pre- OPERATIVE NOTE:   VITALS: BP 115/70  Pulse 83  Temp 98.6 F (37 C)  Resp 20  SpO2 100% Lab Results  Component Value Date   WBC 15.1* 01/08/2012   HGB 11.4* 01/08/2012   HCT 34.2* 01/08/2012   MCV 91.9 01/08/2012   PLT 300 01/08/2012   BMET    Component Value Date/Time   NA 135 01/08/2012 1400   K 4.6 01/08/2012 1400   CL 97 01/08/2012 1400   CO2 31 01/08/2012 1400   GLUCOSE 96 01/08/2012 1400   BUN 18 01/08/2012 1400   CREATININE 1.20 01/08/2012 1400   CALCIUM 8.7 01/08/2012 1400   GFRNONAA 58* 01/08/2012 1400   GFRAA 67* 01/08/2012 1400   No results found for this basename: INR, PROTIME   No results found for this basename: PTT   Austin Mathis presents for dental procedures in OR. Patiet with recent H&P with Dr. Jerelyn Scott. Patient is accetable for surgery. Patient with recent outbreak of herpes labialis ~ 10 days ago. Current Acyclovir treatment. Acceptabelf or OR per discussion with Dr. Tiffany Kocher yesterday. Patient accepts risks and agrees to proceed with treatment as planned.  SUBJECTIVE: Patient denies acute dental changes.  EXAM: Patiet has chronic periodontitis, apical periodontitis, dental caries, retained roots, and malocclusion.   ASSESSMENT/PLAN: Patient is acceptable for OR today. Will proceed with multiple extractions with alveoloplasty and pre-prosthetic surgery as indicated in the operating room.   Charlynne Pander, DDS

## 2012-01-10 NOTE — Anesthesia Postprocedure Evaluation (Signed)
Anesthesia Post Note  Patient: Austin Mathis  Procedure(s) Performed: Procedure(s) (LRB): MULTIPLE EXTRACION WITH ALVEOLOPLASTY (N/A)  Anesthesia type: General  Patient location: PACU  Post pain: Pain level controlled  Post assessment: Post-op Vital signs reviewed  Last Vitals:  Filed Vitals:   01/10/12 1032  BP:   Pulse:   Temp: 36.8 C  Resp: 10    Post vital signs: Reviewed  Level of consciousness: sedated  Complications: No apparent anesthesia complications

## 2012-01-11 ENCOUNTER — Encounter: Payer: Self-pay | Admitting: Gastroenterology

## 2012-01-11 ENCOUNTER — Ambulatory Visit (INDEPENDENT_AMBULATORY_CARE_PROVIDER_SITE_OTHER): Payer: Federal, State, Local not specified - PPO | Admitting: Gastroenterology

## 2012-01-11 ENCOUNTER — Other Ambulatory Visit: Payer: Self-pay | Admitting: Gastroenterology

## 2012-01-11 ENCOUNTER — Encounter (HOSPITAL_COMMUNITY): Payer: Self-pay | Admitting: Pharmacy Technician

## 2012-01-11 VITALS — BP 105/61 | HR 74 | Temp 97.1°F | Ht 67.0 in | Wt 161.8 lb

## 2012-01-11 DIAGNOSIS — C099 Malignant neoplasm of tonsil, unspecified: Secondary | ICD-10-CM

## 2012-01-11 DIAGNOSIS — Z431 Encounter for attention to gastrostomy: Secondary | ICD-10-CM

## 2012-01-11 NOTE — Progress Notes (Signed)
Primary Care Physician:  [name], MD Referring provider: Dr. Mariel Sleet Primary Gastroenterologist:  Jonette Eva, MD   Chief Complaint  Patient presents with  . peg placement    HPI:  Austin Mathis is a 71 y.o. male here at the request of Dellis Anes, PA with Dr. Glenford Peers. We have been requested to place a PEG prior to patient starting chemoradiation for stage IV Tonsillar cancer in the next three weeks. Patient has already received two cycles of Cisplatin/5-FU/Cetuximab. He had his teeth extracted this week in preparation as well. He developed lesions/sores of the lips and mouth during chemo but is finally improving (felt to be herpetic lesions).   Patient is able to tolerate very soft foods at this point. He has stitches from his teeth extractions. Denies heartburn/odynophagia/dysphagia, vomiting, abd pain, constipation, diarrhea, melena, brbpr.   Current Outpatient Prescriptions  Medication Sig Dispense Refill  . acyclovir (ZOVIRAX) 400 MG tablet Take 1 tablet (400 mg total) by mouth 2 (two) times daily.  60 tablet  1  . lidocaine-prilocaine (EMLA) cream Apply 1 application topically as needed. To access port      . lisinopril (PRINIVIL,ZESTRIL) 40 MG tablet Take 40 mg by mouth every morning.       . magnesium oxide (MAG-OX) 400 (241.3 MG) MG tablet Take 400 mg by mouth as directed. Twice a day every other day      . oxyCODONE-acetaminophen (PERCOCET) 5-325 MG per tablet Take one or two tablets by mouth every 4-6 hours as needed for pain.  30 tablet  0  . potassium chloride 20 MEQ/15ML (10%) solution Take 20 mEq by mouth every other day.       . pravastatin (PRAVACHOL) 40 MG tablet Take 40 mg by mouth at bedtime.      . prochlorperazine (COMPAZINE) 10 MG tablet Take 1 tablet (10 mg total) by mouth every 6 (six) hours as needed (Nausea or vomiting).  30 tablet  1  . verapamil (CALAN-SR) 120 MG CR tablet Take 120 mg by mouth at bedtime.        Allergies as of 01/11/2012  .  (No Known Allergies)    Past Medical History  Diagnosis Date  . High cholesterol   . Hearing deficit     Right greater than left.  . 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 Herpetic lesions of face 01/08/2012  . Hypertension     EKG 5/13, chest CT 4/13 [facility], clearance with OV 01/08/12 oncology [facility]  . GERD (gastroesophageal reflux disease)   . Tonsillar cancer     last chemo 01/01/12  . 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 Portacath placement 11/24/2011    left side  . Multiple extractions with alveoloplasty 01/10/2012    Procedure: MULTIPLE EXTRACION WITH ALVEOLOPLASTY;  Surgeon: Charlynne Pander, DDS;  Location: [facility];  Service: Oral Surgery;  Laterality: N/A;  Extraction of tooth #'s 1,3,4,5,6,7,8,9,10,11,12,13,14,15,16,17,18,20,21,22, 23,24,25,26,27,28,29,30 with alveoloplasty.    Family History  Problem Relation Age of Onset  . Colon cancer Neg Hx     History   Social History  . Marital Status: Married    Spouse Name: N/A    Number of Children: 7  . Years of Education: N/A   Occupational History  . Retired Engineer, civil (consulting)    Social History Main Topics  . Smoking status: Never Smoker   . Smokeless tobacco: Never Used  . Alcohol Use: No  Rare use of alcohol.  . Drug Use: No  . Sexually Active: Not on file   Other Topics Concern  . Not on file   Social History Narrative   Patient is married with 7 children. Patient is a [address] but is planning on retiring to [place]. 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 Patient is a nonsmoker nondrinker.Patient has never used smokeless tobacco.Mother is alive and in her 61s. Father passed away at the age of 66.Patient has other relatives that lived to be 100+.      ROS:  General: Negative for anorexia, weight loss, fever, chills, fatigue, weakness. About 5 pound wt loss  with chemo but has since gained wt back. Eyes: Negative for vision changes.  ENT: Negative for hoarseness, difficulty swallowing , nasal congestion. CV: Negative for chest pain, angina, palpitations, dyspnea on exertion, peripheral edema.  Respiratory: Negative for dyspnea at rest, dyspnea on exertion, cough, sputum, wheezing.  GI: See history of present illness. GU:  Negative for dysuria, hematuria, urinary incontinence, urinary frequency, nocturnal urination.  MS: Negative for joint pain, low back pain.  Derm: Negative for rash or itching.  Neuro: Negative for weakness, abnormal sensation, seizure, frequent headaches, memory loss, confusion.  Psych: Negative for anxiety, depression, suicidal ideation, hallucinations.  Endo: Negative for unusual weight change.  Heme: Negative for bruising or bleeding. Allergy: Negative for rash or hives.    Physical Examination:  BP 105/61  Pulse 74  Temp 97.1 F (36.2 C) (Temporal)  Ht 5\' 7"  (1.702 m)  Wt 161 lb 12.8 oz (73.392 kg)  BMI 25.34 kg/m2   General: Well-nourished, well-developed in no acute distress.  Head: Normocephalic, atraumatic.   Eyes: Conjunctiva pink, no icterus. Mouth: Oropharyngeal mucosa with multiple crusted lesions/ulcerations. Stitches in place s/p complete tooth extraction.  Neck: Supple without thyromegaly, masses, or lymphadenopathy.  Lungs: Clear to auscultation bilaterally.  Heart: Regular rate and rhythm, no murmurs rubs or gallops.  Abdomen: Bowel sounds are normal, nontender, nondistended, no hepatosplenomegaly or masses, no abdominal bruits or    hernia , no rebound or guarding.   Rectal: not performed Extremities: No lower extremity edema. No clubbing or deformities.  Neuro: Alert and oriented x 4 , grossly normal neurologically.  Skin: Warm and dry, no rash or jaundice.   Psych: Alert and cooperative, normal mood and affect.  Labs: Lab Results  Component Value Date   WBC 15.1* 01/08/2012   HGB 11.4*  01/08/2012   HCT 34.2* 01/08/2012   MCV 91.9 01/08/2012   PLT 300 01/08/2012   Lab Results  Component Value Date   CREATININE 1.20 01/08/2012   BUN 18 01/08/2012   NA 135 01/08/2012   K 4.6 01/08/2012   CL 97 01/08/2012   CO2 31 01/08/2012   Lab Results  Component Value Date   ALT 9 12/19/2011   AST 12 12/19/2011   ALKPHOS 66 12/19/2011   BILITOT 0.1* 12/19/2011     Imaging Studies: No results found.

## 2012-01-12 ENCOUNTER — Encounter (HOSPITAL_COMMUNITY): Payer: Federal, State, Local not specified - PPO

## 2012-01-12 ENCOUNTER — Encounter (HOSPITAL_COMMUNITY): Payer: Self-pay | Admitting: Dentistry

## 2012-01-12 NOTE — Assessment & Plan Note (Signed)
Stage IV tonsillar cancer. Upcoming chemoradiation and we have been requested to place PEG prior to this. Patient is in agreement and prefers to have PEG placed proactively instead of waiting till he is in need of it potentially. 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  I have discussed the risks, alternatives, benefits with regards to but not limited to the risk of reaction to medication, bleeding, infection, perforation and the patient is agreeable to proceed. Written consent to be obtained.  Will give ancef 1 gram IV on call to procedure.

## 2012-01-13 ENCOUNTER — Ambulatory Visit (HOSPITAL_COMMUNITY): Payer: Federal, State, Local not specified - PPO

## 2012-01-15 ENCOUNTER — Other Ambulatory Visit: Payer: Self-pay | Admitting: Gastroenterology

## 2012-01-15 ENCOUNTER — Inpatient Hospital Stay (HOSPITAL_COMMUNITY): Payer: Federal, State, Local not specified - PPO

## 2012-01-15 DIAGNOSIS — Z431 Encounter for attention to gastrostomy: Secondary | ICD-10-CM

## 2012-01-15 NOTE — Progress Notes (Signed)
Faxed to PCP

## 2012-01-16 ENCOUNTER — Encounter (HOSPITAL_COMMUNITY): Payer: Self-pay | Admitting: *Deleted

## 2012-01-16 ENCOUNTER — Encounter (HOSPITAL_COMMUNITY)
Admission: RE | Disposition: A | Discharge status: Home / Self Care | Payer: Self-pay | Source: Ambulatory Visit | Attending: Gastroenterology

## 2012-01-16 ENCOUNTER — Ambulatory Visit (HOSPITAL_COMMUNITY)
Admission: RE | Admit: 2012-01-16 | Discharge: 2012-01-16 | Disposition: A | Discharge status: Home / Self Care | Payer: Federal, State, Local not specified - PPO | Source: Ambulatory Visit | Attending: Gastroenterology | Admitting: Gastroenterology

## 2012-01-16 DIAGNOSIS — R633 Feeding difficulties: Secondary | ICD-10-CM

## 2012-01-16 DIAGNOSIS — Z79899 Other long term (current) drug therapy: Secondary | ICD-10-CM | POA: Insufficient documentation

## 2012-01-16 DIAGNOSIS — Z9221 Personal history of antineoplastic chemotherapy: Secondary | ICD-10-CM | POA: Insufficient documentation

## 2012-01-16 DIAGNOSIS — A048 Other specified bacterial intestinal infections: Secondary | ICD-10-CM | POA: Insufficient documentation

## 2012-01-16 DIAGNOSIS — C099 Malignant neoplasm of tonsil, unspecified: Secondary | ICD-10-CM | POA: Insufficient documentation

## 2012-01-16 DIAGNOSIS — K2970 Gastritis, unspecified, without bleeding: Secondary | ICD-10-CM

## 2012-01-16 DIAGNOSIS — I10 Essential (primary) hypertension: Secondary | ICD-10-CM | POA: Insufficient documentation

## 2012-01-16 DIAGNOSIS — K2990 Gastroduodenitis, unspecified, without bleeding: Secondary | ICD-10-CM

## 2012-01-16 DIAGNOSIS — Z431 Encounter for attention to gastrostomy: Secondary | ICD-10-CM

## 2012-01-16 DIAGNOSIS — K2940 Chronic atrophic gastritis without bleeding: Secondary | ICD-10-CM | POA: Insufficient documentation

## 2012-01-16 DIAGNOSIS — E7800 Pure hypercholesterolemia, unspecified: Secondary | ICD-10-CM | POA: Insufficient documentation

## 2012-01-16 HISTORY — PX: PEG PLACEMENT: SHX5437

## 2012-01-16 HISTORY — PX: ESOPHAGOGASTRODUODENOSCOPY: SHX5428

## 2012-01-16 SURGERY — EGD (ESOPHAGOGASTRODUODENOSCOPY)
Anesthesia: Moderate Sedation

## 2012-01-16 MED ORDER — LIDOCAINE 1 % OPTIME INJ - NO CHARGE
INTRAMUSCULAR | Status: DC | PRN
  Administered 2012-01-16: 3 mL via INTRADERMAL

## 2012-01-16 MED ORDER — MEPERIDINE HCL 100 MG/ML IJ SOLN
INTRAMUSCULAR | Status: AC
  Filled 2012-01-16: qty 1

## 2012-01-16 MED ORDER — MIDAZOLAM HCL 5 MG/5ML IJ SOLN
INTRAMUSCULAR | Status: AC
  Filled 2012-01-16: qty 10

## 2012-01-16 MED ORDER — BUTAMBEN-TETRACAINE-BENZOCAINE 2-2-14 % EX AERO
INHALATION_SPRAY | CUTANEOUS | Status: DC | PRN
  Administered 2012-01-16: 2 via TOPICAL

## 2012-01-16 MED ORDER — MIDAZOLAM HCL 5 MG/5ML IJ SOLN
INTRAMUSCULAR | Status: DC | PRN
  Administered 2012-01-16 (×2): 2 mg via INTRAVENOUS
  Administered 2012-01-16: 1 mg via INTRAVENOUS

## 2012-01-16 MED ORDER — STERILE WATER FOR IRRIGATION IR SOLN
Status: DC | PRN
  Administered 2012-01-16: 10:00:00

## 2012-01-16 MED ORDER — SODIUM CHLORIDE 0.45 % IV SOLN
Freq: Once | INTRAVENOUS | Status: AC
  Administered 2012-01-16: 09:00:00 via INTRAVENOUS

## 2012-01-16 MED ORDER — CEFAZOLIN SODIUM 1-5 GM-% IV SOLN
INTRAVENOUS | Status: AC
  Filled 2012-01-16: qty 50

## 2012-01-16 MED ORDER — CEFAZOLIN SODIUM 1-5 GM-% IV SOLN
1.0000 g | Freq: Once | INTRAVENOUS | Status: AC
  Administered 2012-01-16: 1 g via INTRAVENOUS

## 2012-01-16 MED ORDER — MEPERIDINE HCL 100 MG/ML IJ SOLN
INTRAMUSCULAR | Status: DC | PRN
  Administered 2012-01-16 (×2): 25 mg via INTRAVENOUS

## 2012-01-16 NOTE — Op Note (Signed)
[facility] [address] [place], [place]  [address]  ENDOSCOPY PROCEDURE REPORT  PATIENT:  Austin Mathis, Austin Mathis  MR#:  [id number] BIRTHDATE:  11-11-1940, 71 yrs. old  GENDER:  male  ENDOSCOPIST:  Jonette Eva, MD Referred by:  [name], M.D. DR. ERIC NEIJSTROM  PROCEDURE DATE:  01/16/2012 PROCEDURE:  EGD with biopsy, [address] ASA CLASS: INDICATIONS:  NEEDS PEG FOR NUTRITION  MEDICATIONS:   Demerol 50 mg IV, Versed 5 mg IV TOPICAL ANESTHETIC:  Cetacaine Spray  DESCRIPTION OF PROCEDURE:     Physical exam was performed. Informed consent was obtained from the patient after explaining the benefits, risks, and alternatives to the procedure.  The patient was connected to the monitor and placed in the left lateral position.  Continuous oxygen was provided by nasal cannula and IV medicine administered through an indwelling cannula.  After administration of sedation, the patient's esophagus was intubated and the EC-[id number] ([id number]) endoscope was advanced under direct visualization to the second portion of the duodenum.  The scope was removed slowly by carefully examining the color, texture, anatomy, and integrity of the mucosa on the way out.  The patient was recovered in endoscopy and discharged home in satisfactory condition.  FINDINGS: NL ESOPHAGUS, NO BARRETT'S. NL DUODENUM.  Mild gastritis was found & BIOPSIED VIA COLD FORCEPS.  COMPLICATIONS:    None  ENDOSCOPIC IMPRESSION: 1) Mild gastritis  RECOMMENDATIONS: PROCEED WITH PEG PLACEMENT AWAIT BIOPSIES FOLLOW UP AS NEEDED  REPEAT EXAM:  No  ______________________________ Jonette Eva, MD  CC:  n. eSIGNED:   Dylin Ihnen at 01/16/2012 10:52 AM  [name], [phone]

## 2012-01-16 NOTE — Discharge Instructions (Signed)
YOU HAVE MILD EROSIVE GASTRITIS. I BIOPSIED YOUR STOMACH. YOUR FEEDING TUBE HAS BEEN PLACED. IT WILL NEED T STAY IN 6 WEEKS BEFORE IT CAN BE REMOVED.   CALL MY OFFICE, [phone],  WITH QUESTIONS REGARDING THE PEG.    UPPER ENDOSCOPY AFTER CARE Read the instructions outlined below and refer to this sheet in the next week. These discharge instructions provide you with general information on caring for yourself after you leave the hospital. While your treatment has been planned according to the most current medical practices available, unavoidable complications occasionally occur. If you have any problems or questions after discharge, call DR. Chloe Miyoshi, [phone].  ACTIVITY  You may resume your regular activity, but move at a slower pace for the next 24 hours.   Take frequent rest periods for the next 24 hours.   Walking will help get rid of the air and reduce the bloated feeling in your belly (abdomen).   No driving for 24 hours (because of the medicine (anesthesia) used during the test).   You may shower.   Do not sign any important legal documents or operate any machinery for 24 hours (because of the anesthesia used during the test).    NUTRITION  Drink plenty of fluids.   You may resume your normal diet as instructed by your doctor.   Begin with a light meal and progress to your normal diet. Heavy or fried foods are harder to digest and may make you feel sick to your stomach (nauseated).   Avoid alcoholic beverages for 24 hours or as instructed.    MEDICATIONS  You may resume your normal medications.   WHAT YOU CAN EXPECT TODAY  Some feelings of bloating in the abdomen.   Passage of more gas than usual.    IF YOU HAD A BIOPSY TAKEN DURING THE UPPER ENDOSCOPY:  No aspirin products for 3 days.   Eat a soft diet IF YOU HAVE NAUSEA, BLOATING, ABDOMINAL PAIN, OR VOMITING.    FINDING OUT THE RESULTS OF YOUR TEST Not all test results are available during your visit.  DR. Darrick Penna WILL CALL YOU WITHIN 7 DAYS OF YOUR PROCEDUE WITH YOUR RESULTS. Do not assume everything is normal if you have not heard from DR. Sarahi Borland IN ONE WEEK, CALL HER OFFICE AT [phone].  SEEK IMMEDIATE MEDICAL ATTENTION AND CALL THE OFFICE: [phone] IF:  You have more than a spotting of blood in your stool.   Your belly is swollen (abdominal distention).   You are nauseated or vomiting.   You have a temperature over 101F.   You have abdominal pain or discomfort that is severe or gets worse throughout the day.  Gastritis  Gastritis is an inflammation (the body's way of reacting to injury and/or infection) of the stomach. It is often caused by viral or bacterial (germ) infections. It can also be caused BY ASPIRIN, BC/GOODY POWDER'S, (IBUPROFEN) MOTRIN, OR ALEVE (NAPROXEN), chemicals (including alcohol), SPICY FOODS, and medications. This illness may be associated with generalized malaise (feeling tired, not well), UPPER ABDOMINAL STOMACH cramps, and fever. One common bacterial cause of gastritis is an organism known as H. Pylori. This can be treated with antibiotics.   Care of a Feeding Tube Site Individuals who have trouble swallowing or cannot take food or medication by mouth are sometimes given feeding tubes. A feeding tube can go into the nose and down to the stomach or through the skin in the abdomen and into the stomach or small bowel. Some of the names  of these feeding tubes are gastrostomy tubes, PEG lines, nasogastric tubes, and gastrojejunostomy tubes.  Liquids or foods that have been made into a thick, smooth soup consistency (pureed), along with medications, may be given through the tube. There are several ways to give the liquid food (formula), and there are several kinds of prescribed formulas. Your caregiver will arrange for you to get nutrition in the right amounts for you. EQUIPMENT NEEDED FOR TUBE FEEDING  A 60 mL syringe.   Formula suggested by your caregiver or  dietician.   A feeding pump or a place to hang your food container (an IV pole or a wall hook) so it can operate by gravity while you are getting your feedings. You attach the tube from the food container to the end of the feeding tube. Your caregivers will help you with these supplies or tell you where to get them.  PROCEDURE FOR TUBE FEEDING 1. Wash your hands before touching the equipment, food, medications, or the site (where the tube enters your body).  2. Check the tube placement before starting the feeding by removing the plug in the end of the feeding tube and attaching a syringe to the feeding tube. Pull the plunger back and you will usually see some yellowish or greenish fluid. This tells you the tube is in the correct place. Note the amount and gently push the residual back into the tube.  Ask your caregiver if there are instances when you would not start tube feedings depending on the amount or type of contents withdrawn from the stomach.   If gastrointestinal (GI) contents do not show when you pull back on the plunger, measure the length from the stoma site (the opening in your body made for feeding) to the end of your feeding tube. If this different from the previous measurement, you should call your caregiver.   If at any point the feeding tube seems blocked, use your syringe and pull back on the plunger. If this does not work, try to gently force some warm water through the tube. If none of these methods work, call your caregiver. It is important not to miss your medications, food, or water.  3. If everything with the tube seems to be okay, insert the tip of the tube from the food container into your feeding tube.  4. While sitting up or with your head propped up at least 30 degrees to 45 degrees, start the feeding. If you develop coughing or have trouble breathing, stop the feeding immediately.  5. Administer the feeding for the length of time instructed by your caregiver.  6. To keep  the tube open, following the feeding, flush the tube with 30 mLs of water using a syringe, and replace the plug at the end of the feeding tube.  7. Do not leave unused food out between feedings. Refrigerate or store it as directed.  GIVING YOUR MEDICATIONS THROUGH THE TUBE  Use liquid forms of your medications, if available.   Some pills or tablets may be crushed and put into warm water. Do not use hot water, which could affect the contents.   Ask the pharmacist if you can crush your pills. Do not crush capsules that have SR (sustained release), XR (extended release), or CD (controlled release) printed on them unless your caregiver or pharmacist says it is okay.   After you have crushed your pills or capsules into small pieces or a powder, let the pieces dissolve in warm (not hot) water so  that no pieces will clog your tube. Draw medication up into your syringe by pulling back on the plunger.   Attach the syringe to the end of the feeding tube and push on the plunger to give your medication.   Flush the tube with 30 mLs of water after giving your medication. This makes sure you have received all of your medications.  CARE OF THE SKIN AROUND THE ENTRANCE OF THE TUBE  Check the skin daily where the tube enters the abdomen for redness, irritation, drainage, or tenderness.   Clean around the tube daily with water or soap and water using cotton-tipped applicators or gauze squares. Dry completely after cleaning.   Change the dressing around the tube insertion site daily FOR 7 DAYS. THE TOP OF THE BUMPER IS AT 4.5 CM. THE SKIN [place] AT 3.5 CM.   If the dressing becomes wet or soiled, change it as soon as convenient.   You may use tape to fasten your feeding tube to your skin for comfort or do as directed.  SEEK IMMEDIATE MEDICAL CARE IF:   You develop coughing or have trouble breathing during a feeding. Stop the feeding and call your caregiver immediately.   You notice swelling, redness,  drainage, or tenderness at the tube insertion site.   You develop pain,nausea, vomiting, diarrhea, constipation, or bleeding around the tube insertion site.   The tube seems plugged and you are unable to get water through the tube.   There is food leaking from around the tube.   The tube falls out.

## 2012-01-16 NOTE — Op Note (Addendum)
[facility] [address] [place], [place]  [address]  OPERATIVE PROCEDURE REPORT  PATIENT:  Austin Mathis, Austin Mathis  MR#:  [id number] BIRTHDATE:  Dec 26, 1940  GENDER:  male  ENDOSCOPIST:  Jonette Eva, MD ASSISTANT:  PROCEDURE DATE:  01/16/2012 PROCEDURE:  Peg Placement ASA CLASS: INDICATIONS:  STAGEIV TONSILLAR CA-NEEDS PEG FOR NURTITION  MEDICATIONS:   Demerol 50 mg IV, Versed 5 mg IV TOPICAL ANESTHETIC:  Cetacaine Spray  DESCRIPTION OF PROCEDURE:  A history and physical had previously been performed.  The procedure, indications, potential complications (bleeding, perforation, infection, adverse medication reaction), and alternatives were explained to the patient who appeared to understand.  Opportunity for questions was provided and informed consent was obtained.  The PEG tube was PLACED USING THE PUSH PULL METHOD. A 20 Fr gastrostomy tube WITH A BUMPER was inserted. The G-tube insertion site was then cleansed, and the external bolster was placed over the tube to secure it to the abdominal wall AT4.5 CM.  A sterile dressing was then applied, and the procedure terminated.  COMPLICATIONS:  None  ENDOSCOPIC IMPRESSION: 1) Succesful gatrostomy placement  RECOMMENDATIONS: 1) follow PEG suggestions HANDOUT GIVEN  PT DECLINED HOME HEALTH REFERRAL  REPEAT EXAM:  No  __________________________________ Jonette Eva, MD  CC:  n. REVISED:  01/29/2012 01:45 PM eSIGNED:   Uno Esau at 01/29/2012 01:45 PM  [name], [phone]

## 2012-01-16 NOTE — Progress Notes (Signed)
REVIEWED.  

## 2012-01-16 NOTE — H&P (Signed)
Primary Care Physician:  [name], MD Primary Gastroenterologist:  Dr. Darrick Penna  Pre-Procedure History & Physical: HPI:  Austin Mathis is a 71 y.o. male here for TONSILLAR CA. NEEDS PEG FOR NUTRTION.  Past Medical History  Diagnosis Date  . High cholesterol   . Hearing deficit     Right greater than left.  . 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 Herpetic lesions of face 01/08/2012  . Hypertension     EKG 5/13, chest CT 4/13 EPIC, clearance with OV 01/08/12 oncology T [facility] EP[facility]  . GERD (gastroesophageal reflux disease)   . Tonsillar cancer     last chemo 01/01/12  . 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 Portacath placement 11/24/2011    left side  . Multiple extractions with alveoloplasty 01/10/2012    Procedure: MULTIPLE EXTRACION WITH ALVEOLOPLASTY;  Surgeon: Charlynne Pander, DDS;  Location: [facility];  Service: Oral Surgery;  Laterality: N/A;  Extraction of tooth #'s 1,3,4,5,6,7,8,9,10,11,12,13,14,15,16,17,18,20,21,22, 23,24,25,26,27,28,29,30 with alveoloplasty.    Prior to Admission medications   Medication Sig Start Date End Date Taking? Authorizing Provider  acyclovir (ZOVIRAX) 400 MG tablet Take 1 tablet (400 mg total) by mouth 2 (two) times daily. 01/08/12 01/18/12 Yes Ellouise Newer, PA  lidocaine-prilocaine (EMLA) cream Apply 1 application topically as needed. To access port   Yes Historical Provider, MD  lisinopril (PRINIVIL,ZESTRIL) 40 MG tablet Take 40 mg by mouth every morning.    Yes Historical Provider, MD  magnesium oxide (MAG-OX) 400 (241.3 MG) MG tablet Take 400 mg by mouth as directed. Twice a day every other day 12/20/11 12/19/12 Yes Randall An, MD  potassium chloride 20 MEQ/15ML (10%) solution Take 20 mEq by mouth every other day.    Yes Historical Provider, MD  pravastatin (PRAVACHOL) 40 MG tablet Take 40 mg by mouth at bedtime.   Yes Historical Provider, MD  prochlorperazine (COMPAZINE)  10 MG tablet Take 1 tablet (10 mg total) by mouth every 6 (six) hours as needed (Nausea or vomiting). 11/16/11 11/15/12 Yes Lauretta I Odogwu, MD  verapamil (CALAN-SR) 120 MG CR tablet Take 120 mg by mouth at bedtime.   Yes Historical Provider, MD  oxyCODONE-acetaminophen (PERCOCET) 5-325 MG per tablet Take one or two tablets by mouth every 4-6 hours as needed for pain. 01/10/12 01/20/12  Charlynne Pander, DDS    Allergies as of 01/11/2012  . (No Known Allergies)    Family History  Problem Relation Age of Onset  . Colon cancer Neg Hx     History   Social History  . Marital Status: Married    Spouse Name: N/A    Number of Children: 7  . Years of Education: N/A   Occupational History  . Retired Engineer, civil (consulting)    Social History Main Topics  . Smoking status: Never Smoker   . Smokeless tobacco: Never Used  . Alcohol Use: No     Rare use of alcohol.  . Drug Use: No  . Sexually Active: Not on file   Other Topics Concern  . Not on file   Social History Narrative   Patient is married with 7 children. Patient is a [address] but is planning on retiring to [place]. 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 Patient is a nonsmoker nondrinker.Patient has never used smokeless tobacco.Mother is alive and in her 69s. Father passed away at the age  of 73.Patient has other relatives that lived to be 100+.    Review of Systems: See HPI, otherwise negative ROS   Physical Exam: BP 128/74  Pulse 69  Temp 98.1 F (36.7 C) (Oral)  Resp 19  Ht 5\' 7"  (1.702 m)  Wt 161 lb (73.029 kg)  BMI 25.22 kg/m2  SpO2 96% General:   Alert,  pleasant and cooperative in NAD Head:  Normocephalic and atraumatic. Neck:  Supple;  Lungs:  Clear throughout to auscultation.    Heart:  Regular rate and rhythm. Abdomen:  Soft, nontender and nondistended. Normal bowel sounds, without guarding, and without rebound.   Neurologic:  Alert and   oriented x4;  grossly normal neurologically.  Impression/Plan:    STAGE IV TONSILAR CA NEEDS PEG PRIOR TO CHEMO  PLAN: 1. EGD/PEG TODAY

## 2012-01-17 ENCOUNTER — Telehealth: Payer: Self-pay | Admitting: Gastroenterology

## 2012-01-17 ENCOUNTER — Encounter: Payer: Self-pay | Admitting: Gastroenterology

## 2012-01-17 NOTE — Telephone Encounter (Signed)
PLEASE CALL PT. HIS stomach Bx showed H. Pylori infection. He needs AMOXICILLIN 500 mg 2 po BID for 10 days and Biaxin 500 mg po bid for 10 days, #qs, rfx0. START OMEPRAZOLE 20 MG BID FOR 10 DAYS THEN ONCE DAILY FOR 3 MOS, #31, RFX2. Med side effects include NVD, abd pain, and metallic taste. HE SHOULD NOT TAKE PRAVACHOL WHILE TAKING HIS ANTIBIOTICS.

## 2012-01-17 NOTE — Telephone Encounter (Signed)
ERROR

## 2012-01-18 NOTE — Telephone Encounter (Signed)
Results Cc to PCP  

## 2012-01-18 NOTE — Telephone Encounter (Signed)
LMOM for a return call.  

## 2012-01-18 NOTE — Progress Notes (Signed)
Pt returned call and was given results. ( See phone note of 01/17/2012/ results). Rx's called to [facility] at [facility].

## 2012-01-19 ENCOUNTER — Encounter (HOSPITAL_COMMUNITY): Payer: Self-pay | Admitting: Gastroenterology

## 2012-01-22 ENCOUNTER — Ambulatory Visit (HOSPITAL_COMMUNITY): Payer: Medicaid - Dental | Admitting: Dentistry

## 2012-01-22 ENCOUNTER — Encounter (HOSPITAL_COMMUNITY): Payer: Self-pay | Admitting: Dentistry

## 2012-01-22 VITALS — BP 119/73 | HR 81 | Temp 97.5°F

## 2012-01-22 DIAGNOSIS — K08199 Complete loss of teeth due to other specified cause, unspecified class: Secondary | ICD-10-CM

## 2012-01-22 DIAGNOSIS — K08109 Complete loss of teeth, unspecified cause, unspecified class: Secondary | ICD-10-CM

## 2012-01-22 NOTE — Progress Notes (Signed)
POST OPERATIVE NOTE:  01/22/2012 Austin Mathis [id number]  VITALS: BP 119/73  Pulse 81  Temp 97.5 F (36.4 C) (Oral)  Austin Mathis  is status post multiple extractions with alveoloplasty in the operating room on 01/09/2029.  SUBJECTIVE: Patient without complaints. Patient indicates that he never had to take "any" pain medication. Patient is still using salt water rinses daily. Patient scheduled for stimulation this week with Dr. Roselind Messier on Wednesday.  EXAM: There is no sign of infection, heme, or ooze. A few sutures remain loosely. Generalized primary closure is noted. Maximum interincisal opening is measured at 55 mm. Trismus device fabricated today at 53 mm with 31 tongue depressors. Instructions on care and use of trismus device was provided today.  ASSESSMENT: Post operative course is consistent with dental procedures performed in the operating room.   PLAN: 1. Continue saltwater rinses every 2 hours while awake as needed. Brush tongue twice daily. 2. Use trismus exercises day. 3. Return to clinic for periodic oral examination during radiation therapy in 2-3 weeks. 4. Call if problems arise. Please note: Patient was given his gold crown that was extracted last week.   Austin Mathis, DDS

## 2012-01-22 NOTE — Patient Instructions (Signed)
TRISMUS  Trismus is a condition where the jaw does not allow the mouth to open as wide as it usually does.  This can happen almost suddenly, or in other cases the process is so slow, it is hard to notice it-until it is too far along.  When the jaw joints and/or muscles have been exposed to radiation treatments, the onset of Trismus is very slow.  This is because the muscles are losing their stretching ability over a long period of time, as long as 2 YEARS after the end of radiation.  It is therefore important to exercise these muscles and joints.  TRISMUS EXERCISES   Stack of tongue depressors measuring the same or a little less than the last documented MIO (Maximum Interincisal Opening).  Secure them with a rubber band on both ends.  Place the stack in the patient's mouth, supporting the other end.  Allow 30 seconds for muscle stretching.  Rest for a few seconds.  Repeat 3-5 times  For all radiation patients, this exercise is recommended in the mornings and evenings unless otherwise instructed.  The exercise should be done for a period of 2 YEARS after the end of radiation.  MIO should be checked routinely on recall dental visits by the general dentist or the hospital dentist.  The patient is advised to report any changes, soreness, or difficulties encountered when doing the exercises. 

## 2012-01-23 ENCOUNTER — Telehealth (HOSPITAL_COMMUNITY): Payer: Self-pay | Admitting: *Deleted

## 2012-01-23 NOTE — Telephone Encounter (Signed)
Patient will know tomorrow the specific date next week that radiation starts. He is to call me tomorrow and let me know. I am assuming that he will start on Monday. Is that the potential date that you would like for me to schedule his Cisplatin?

## 2012-01-24 ENCOUNTER — Ambulatory Visit
Admission: RE | Admit: 2012-01-24 | Discharge: 2012-01-24 | Disposition: A | Discharge status: Home / Self Care | Payer: Federal, State, Local not specified - PPO | Source: Ambulatory Visit | Attending: Radiation Oncology | Admitting: Radiation Oncology

## 2012-01-24 ENCOUNTER — Ambulatory Visit
Admission: RE | Admit: 2012-01-24 | Discharge: 2012-01-24 | Payer: Federal, State, Local not specified - PPO | Source: Ambulatory Visit | Attending: Radiation Oncology | Admitting: Radiation Oncology

## 2012-01-24 ENCOUNTER — Encounter: Payer: Self-pay | Admitting: Radiation Oncology

## 2012-01-24 VITALS — BP 113/71 | HR 84 | Temp 98.5°F | Wt 162.0 lb

## 2012-01-24 DIAGNOSIS — C099 Malignant neoplasm of tonsil, unspecified: Secondary | ICD-10-CM

## 2012-01-24 LAB — BUN: BUN: 14 mg/dL (ref 6–23)

## 2012-01-24 NOTE — Progress Notes (Signed)
Met with patient to discuss RO billing.  Dx: 146.0 tonsil, NOS (excludes Lingual tonsil T-141.6 & Pharyn   Attending Rad: Dr. Roselind Messier   Rad Tx: IMRT  x 40

## 2012-01-24 NOTE — Progress Notes (Signed)
Austin Mathis arrived to see Dr. Roselind Messier

## 2012-01-24 NOTE — Progress Notes (Signed)
  Radiation Oncology         (336) [phone] ________________________________  Name: Austin Mathis MRN: [id number]  Date: 01/24/2012  DOB: 10-27-1940  Reevaluation note  CC: [name], MD  Laurice Record, MD  Diagnosis: Stage IV tonsillar carcinoma    Narrative:  The patient returns today for further evaluation. Patient has completed additional neoadjuvant chemotherapy. He did have feeding tube placed and is not experiencing any.any discomfort or difficulty with this issue.   He has also completed the multiple extractions with alveolar plasty by Dr. Kristin Bruins.                          ALLERGIES:   has no known allergies.  Meds: Current Outpatient Prescriptions  Medication Sig Dispense Refill  . amoxicillin (AMOXIL) 500 MG tablet Take 500 mg by mouth 2 (two) times daily.      Marland Kitchen lidocaine-prilocaine (EMLA) cream Apply 1 application topically as needed. To access port      . lisinopril (PRINIVIL,ZESTRIL) 40 MG tablet Take 40 mg by mouth every morning.       . magnesium oxide (MAG-OX) 400 (241.3 MG) MG tablet Take 400 mg by mouth as directed. Twice a day every other day      . potassium chloride 20 MEQ/15ML (10%) solution Take 20 mEq by mouth every other day.       . verapamil (CALAN-SR) 120 MG CR tablet Take 120 mg by mouth at bedtime.      Marland Kitchen acyclovir (ZOVIRAX) 400 MG tablet Take 400 mg by mouth 2 (two) times daily.      . pravastatin (PRAVACHOL) 40 MG tablet Take 40 mg by mouth at bedtime.      . prochlorperazine (COMPAZINE) 10 MG tablet Take 1 tablet (10 mg total) by mouth every 6 (six) hours as needed (Nausea or vomiting).  30 tablet  1    Physical Findings: The patient is in no acute distress. Patient is alert and oriented.  weight is 162 lb (73.483 kg). His temperature is 98.5 F (36.9 C). His blood pressure is 113/71 and his pulse is 84. .  The lungs are clear. The heart has a regular rhythm and rate. The patient appears to have had excellent response to his neoadjuvant  treatment without any obvious palpable lymphadenopathy in the neck at this time.  Lab Findings: Lab Results  Component Value Date   WBC 15.1* 01/08/2012   HGB 11.4* 01/08/2012   HCT 34.2* 01/08/2012   MCV 91.9 01/08/2012   PLT 300 01/08/2012    @LASTCHEM @  Radiographic Findings: No results found.  Impression:  The patient is now ready to proceed with the radiation and radiosensitizing chemotherapy as part of his overall management. Later today the patient will undergo  simulation. The patient's BUN was elevated today and therefore IV contrast will not be given during his treatment planning CT scan.  Plan:  Intensity modulated radiation therapy to a cumulative dose of approximately 7000 cGy.  _____________________________________   Billie Lade, PhD, MD

## 2012-01-25 NOTE — Progress Notes (Signed)
  Radiation Oncology         (336) [phone] ________________________________  Name: Austin Mathis MRN: [id number]  Date: 01/24/2012  DOB: 1940/12/08  SIMULATION AND TREATMENT PLANNING NOTE  DIAGNOSIS:  Stage IV tonsillar carcinoma  NARRATIVE:  The patient was brought to the CT Simulation planning suite.  Identity was confirmed.  All relevant records and images related to the planned course of therapy were reviewed.  The patient freely provided informed written consent to proceed with treatment after reviewing the details related to the planned course of therapy. The consent form was witnessed and verified by the simulation staff.  Then, the patient was set-up in a stable reproducible  supine position for radiation therapy.  CT images were obtained.  Surface markings were placed.  The CT images were loaded into the planning software.  Then the target and avoidance structures were contoured.  Treatment planning then occurred.  The radiation prescription was entered and confirmed.  A total of 1 complex treatment devices were fabricated. I have requested : Intensity Modulated Radiotherapy (IMRT) is medically necessary for this case for the following reason:  Parotid sparing..  I have ordered:Nutrition Consult   PLAN:  The patient will receive 70 Gy in 35 fractions   Special treatment procedure.    The patient will receive radiosensitizing chemotherapy throughout his course of radiation treatment. Given the increased potential for toxicities as well as the necessity for close monitoring of the patient and blood work, this constitutes a special treatment procedure. ________________________________   Billie Lade, PhD, MD

## 2012-01-30 ENCOUNTER — Telehealth (HOSPITAL_COMMUNITY): Payer: Self-pay | Admitting: *Deleted

## 2012-01-30 NOTE — Telephone Encounter (Signed)
Have not gotten in touch with Austin Mathis yet however I did call [facility] and spoke with Austin Mathis who stated that his start date is July 15th and will end August 30. He is scheduled for 8am daily. I am  Scheduling him for chemo on Monday July 15th around 10am.  The plan needs to be built!!!

## 2012-01-31 ENCOUNTER — Encounter (HOSPITAL_COMMUNITY): Payer: Federal, State, Local not specified - PPO | Attending: Oncology | Admitting: Oncology

## 2012-01-31 VITALS — BP 122/76 | HR 75 | Temp 98.1°F | Ht 67.0 in | Wt 165.0 lb

## 2012-01-31 DIAGNOSIS — B977 Papillomavirus as the cause of diseases classified elsewhere: Secondary | ICD-10-CM

## 2012-01-31 DIAGNOSIS — H9190 Unspecified hearing loss, unspecified ear: Secondary | ICD-10-CM | POA: Insufficient documentation

## 2012-01-31 DIAGNOSIS — C099 Malignant neoplasm of tonsil, unspecified: Secondary | ICD-10-CM

## 2012-01-31 DIAGNOSIS — E7800 Pure hypercholesterolemia, unspecified: Secondary | ICD-10-CM | POA: Insufficient documentation

## 2012-01-31 DIAGNOSIS — K219 Gastro-esophageal reflux disease without esophagitis: Secondary | ICD-10-CM | POA: Insufficient documentation

## 2012-01-31 DIAGNOSIS — I10 Essential (primary) hypertension: Secondary | ICD-10-CM | POA: Insufficient documentation

## 2012-01-31 DIAGNOSIS — R599 Enlarged lymph nodes, unspecified: Secondary | ICD-10-CM | POA: Insufficient documentation

## 2012-01-31 NOTE — Progress Notes (Signed)
Austin Punt, MD [address] [place] [address]  1. Primary squamous cell carcinoma of tonsil  CBC, Differential, Comprehensive metabolic panel, CBC, Differential, Basic metabolic panel, CBC, Differential, Comprehensive metabolic panel    CURRENT THERAPY:S/P 2 cycle of Cisplatin Day 1 and 5-FU continuous infusion days 1-4 every 21 days and weekly cetuximab.  S/P tooth extraction in preparation for concomitant chemoradiation with weekly cisplatin.  Anticipated to embark on radiation on 02/05/2012.   INTERVAL HISTORY: Austin Mathis 71 y.o. male returns for  regular  visit for followup of bilateral neck adenopathy from a primary in the tonsil on the best 4.3 x 2.7 cm in size. His biopsy showed squamous cell carcinoma consistent with a tonsillar primary with bilateral cervical adenopathy and the PET scan showed no distant disease. 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  Austin Mathis is status post aforementioned chemotherapy, PEG tube insertion, and tooth extraction in preparation for concomitant radiation and chemotherapy consisted of weekly cisplatin. We spent some time discussing side effects of the aforementioned regimen. The patient is knowledgeable about side effects of radiation therapy which was provided to him by his radiation oncologist Dr. Roselind Messier.  Austin Mathis is prepared for the upcoming regimen. He will be administered his first dose of radiation on July 15 in the morning and then he will come to the clinic for chemotherapy administration as well. We'll give him weekly cisplatin on Mondays. We will perform prechemotherapy laboratory work the day of therapy to verify that his counts are satisfactory for chemotherapy.  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   He does have another house in [place] and he explains that "you guys are keeping me from going  to my [place] house.  I am ready to get this done and over with."  Austin Mathis, presently is doing very well. He reports his appetite is very strong. He continues to gain some weight. He understands that this may change in the future with therapy.  He denies any complaints.   Past Medical History  Diagnosis Date  . High cholesterol   . Hearing deficit     Right greater than left.  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 Hypertension     EKG 5/13, chest CT 4/13 EPIC, clearance with OV 01/08/12 oncology T [facility] EPIC  . GERD (gastroesophageal reflux disease)   . Tonsillar cancer     last chemo 01/01/12  . Primary squamous cell carcinoma of tonsil 11/16/2011    has Primary squamous cell carcinoma of tonsil; Hypertension; Hypercholesterolemia; Herpetic lesions of face; and Encounter for PEG (percutaneous endoscopic gastrostomy) on his problem list.      has no known allergies.  Austin Mathis had no medications administered during this visit.  Past Surgical History  Procedure Date  . Portacath placement 11/24/2011    left side  . Multiple extractions with alveoloplasty 01/10/2012    Procedure: MULTIPLE EXTRACION WITH ALVEOLOPLASTY;  Surgeon: Charlynne Pander, DDS;  Location: [facility];  Service: Oral Surgery;  Laterality: N/A;  Extraction of tooth #'s 1,3,4,5,6,7,8,9,10,11,12,13,14,15,16,17,18,20,21,22, 23,24,25,26,27,28,29,30 with alveoloplasty.  . Esophagogastroduodenoscopy 01/16/2012    Procedure: ESOPHAGOGASTRODUODENOSCOPY (EGD);  Surgeon: West Bali, MD;  Location: [facility];  Service: Endoscopy;  Laterality: N/A;  9:30  . Peg placement 01/16/2012    Procedure: PERCUTANEOUS ENDOSCOPIC GASTROSTOMY (PEG) PLACEMENT;  Surgeon: West Bali, MD;  Location: [facility];  Service: Endoscopy;  Laterality: N/A;  Ancef one gram IV before procedure    Denies any headaches, dizziness, double vision, fevers, chills, night sweats, nausea, vomiting, diarrhea, constipation, chest pain, heart  palpitations, shortness of breath, blood in stool, black tarry stool, urinary pain, urinary burning, urinary frequency, hematuria.   PHYSICAL EXAMINATION  ECOG PERFORMANCE STATUS: 1 - Symptomatic but completely ambulatory  Filed Vitals:   01/31/12 1434  BP: 122/76  Pulse: 75  Temp: 98.1 F (36.7 C)    GENERAL:alert, no distress, well nourished, well developed, comfortable, cooperative and smiling SKIN: skin color, texture, turgor are normal, no rashes or significant lesions HEAD: Normocephalic, No masses, lesions, tenderness or abnormalities EYES: normal, PERRLA, EOMI, Conjunctiva are pink and non-injected EARS: External ears normal OROPHARYNX:lips, buccal mucosa, and tongue normal, edentulous and mucous membranes are moist.  Well healed lips where there previously was a herpetic outbreak.  NECK: supple, no adenopathy, thyroid normal size, non-tender, without nodularity, no stridor, non-tender, trachea midline LYMPH:  no palpable lymphadenopathy BREAST:not examined LUNGS: clear to auscultation and percussion HEART: regular rate & rhythm, no murmurs, no gallops, S1 normal and S2 normal ABDOMEN:abdomen soft, non-tender and normal bowel sounds, PEG tube appreciated. BACK: Back symmetric, no curvature. EXTREMITIES:less then 2 second capillary refill, no joint deformities, effusion, or inflammation, no edema, no skin discoloration, no clubbing, no cyanosis  NEURO: alert & oriented x 3 with fluent speech, no focal motor/sensory deficits, gait normal    PATHOLOGY: Right tonsillar mass, invasive squamous cell carcinoma, HPV+, strong p16 immunohistochemical staining.     ASSESSMENT:  1. Bilateral neck adenopathy from a primary in the tonsil on the best 4.3 x 2.7 cm in size. His biopsy showed squamous cell carcinoma consistent with a tonsillar primary with bilateral cervical adenopathy and the PET scan showed no distant disease. 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  2.  Herpetic lesions of the lips, resolved.  On prophylactic acyclovir.    PLAN:  1. Patient is scheduled for radiation starting on July 15. 2. Chemotherapy plan has been built consisting of weekly cisplatin.  3. This is reflected in the treatment plan and his previous chemotherapy regimen has been completed and discontinued.  4. Pre-chemo lab work ordered weekly in preparation for Cisplatin: CBC diff, CMET/BMET 5. He was encouraged to continue with the Acyclovir prophylactically while on therapy.  6. Nurse navigator, Austin Mathis is providing him with some education in preparation for his Cisplatin chemotherapy.  She is also reviewing his anti-emetic regimen.  7. Return in 3 weeks for follow-up.   All questions were answered. The patient knows to call the clinic with any problems, questions or concerns. We can certainly see the patient much sooner if necessary.  The patient and plan discussed with Glenford Peers, MD and he is in agreement with the aforementioned.  Austin Mathis

## 2012-01-31 NOTE — Progress Notes (Signed)
Patients med calendar made with appt information. Also, dex, zofran, & ativan called into [facility]. I personally spoke with a pharmacist. Will give this med calendar to patient on Monday July 15.

## 2012-01-31 NOTE — Patient Instructions (Addendum)
Austin Mathis  [id number] [birth year] Dr. Glenford Peers   [name]  Discharge Instructions  RECOMMENDATIONS MADE BY THE CONSULTANT AND ANY TEST RESULTS WILL BE SENT TO YOUR REFERRING DOCTOR.   EXAM FINDINGS BY MD TODAY AND SIGNS AND SYMPTOMS TO REPORT TO CLINIC OR PRIMARY MD: Exam and discussion per PA.  We will restart your chemotherapy on Monday.  MEDICATIONS PRESCRIBED: none   INSTRUCTIONS GIVEN AND DISCUSSED: Other :  Report fevers, chills, uncontrolled nausea or vomiting, etc.  SPECIAL INSTRUCTIONS/FOLLOW-UP: Return to Clinic on Monday for Chemotherapy and in 3 weeks to see Dr. Mariel Sleet.   I acknowledge that I have been informed and understand all the instructions given to me and received a copy. I do not have any more questions at this time, but understand that I may call the Specialty Clinic at [name] at [phone] during business hours should I have any further questions or need assistance in obtaining follow-up care.    __________________________________________  _____________  __________ Signature of Patient or Authorized Representative            Date                   Time    __________________________________________ Nurse's Signature

## 2012-02-02 ENCOUNTER — Other Ambulatory Visit (HOSPITAL_COMMUNITY): Payer: Self-pay | Admitting: Oncology

## 2012-02-05 ENCOUNTER — Ambulatory Visit
Admission: RE | Admit: 2012-02-05 | Discharge: 2012-02-05 | Disposition: A | Discharge status: Home / Self Care | Payer: Federal, State, Local not specified - PPO | Source: Ambulatory Visit | Attending: Radiation Oncology | Admitting: Radiation Oncology

## 2012-02-05 ENCOUNTER — Encounter (HOSPITAL_BASED_OUTPATIENT_CLINIC_OR_DEPARTMENT_OTHER): Payer: Federal, State, Local not specified - PPO

## 2012-02-05 DIAGNOSIS — C099 Malignant neoplasm of tonsil, unspecified: Secondary | ICD-10-CM

## 2012-02-05 DIAGNOSIS — Z5111 Encounter for antineoplastic chemotherapy: Secondary | ICD-10-CM

## 2012-02-05 LAB — DIFFERENTIAL
Basophils Absolute: 0 10*3/uL (ref 0.0–0.1)
Eosinophils Absolute: 0.2 10*3/uL (ref 0.0–0.7)
Eosinophils Relative: 3 % (ref 0–5)
Lymphocytes Relative: 26 % (ref 12–46)
Lymphs Abs: 1.6 10*3/uL (ref 0.7–4.0)
Neutrophils Relative %: 61 % (ref 43–77)

## 2012-02-05 LAB — COMPREHENSIVE METABOLIC PANEL
ALT: 9 U/L (ref 0–53)
AST: 15 U/L (ref 0–37)
Alkaline Phosphatase: 66 U/L (ref 39–117)
Calcium: 7.8 mg/dL — ABNORMAL LOW (ref 8.4–10.5)
Glucose, Bld: 92 mg/dL (ref 70–99)
Potassium: 4.3 mEq/L (ref 3.5–5.1)
Sodium: 138 mEq/L (ref 135–145)
Total Protein: 7 g/dL (ref 6.0–8.3)

## 2012-02-05 LAB — CBC
MCH: 31.8 pg (ref 26.0–34.0)
MCV: 94.5 fL (ref 78.0–100.0)
Platelets: 207 10*3/uL (ref 150–400)
RBC: 3.11 MIL/uL — ABNORMAL LOW (ref 4.22–5.81)
RDW: 16.5 % — ABNORMAL HIGH (ref 11.5–15.5)
WBC: 6.4 10*3/uL (ref 4.0–10.5)

## 2012-02-05 MED ORDER — HEPARIN SOD (PORK) LOCK FLUSH 100 UNIT/ML IV SOLN
INTRAVENOUS | Status: AC
  Filled 2012-02-05: qty 5

## 2012-02-05 MED ORDER — SODIUM CHLORIDE 0.9 % IJ SOLN
10.0000 mL | INTRAMUSCULAR | Status: DC | PRN
  Filled 2012-02-05: qty 10

## 2012-02-05 MED ORDER — DEXAMETHASONE SODIUM PHOSPHATE 4 MG/ML IJ SOLN: 20.0000 mg | Freq: Once | INTRAMUSCULAR | Status: DC

## 2012-02-05 MED ORDER — SODIUM CHLORIDE 0.9 % IV SOLN
Freq: Once | INTRAVENOUS | Status: AC
  Administered 2012-02-05: 16 mg via INTRAVENOUS
  Filled 2012-02-05: qty 8

## 2012-02-05 MED ORDER — SODIUM CHLORIDE 0.9 % IV SOLN
Freq: Once | INTRAVENOUS | Status: AC
  Administered 2012-02-05: 10:00:00 via INTRAVENOUS

## 2012-02-05 MED ORDER — SODIUM CHLORIDE 0.9 % IV SOLN
40.0000 mg/m2 | Freq: Once | INTRAVENOUS | Status: AC
  Administered 2012-02-05: 75 mg via INTRAVENOUS
  Filled 2012-02-05: qty 75

## 2012-02-05 MED ORDER — ONDANSETRON HCL 40 MG/20ML IJ SOLN: 16.0000 mg | Freq: Once | INTRAMUSCULAR | Status: DC

## 2012-02-05 MED ORDER — HEPARIN SOD (PORK) LOCK FLUSH 100 UNIT/ML IV SOLN
500.0000 [IU] | Freq: Once | INTRAVENOUS | Status: AC | PRN
  Administered 2012-02-05: 500 [IU]
  Filled 2012-02-05: qty 5

## 2012-02-05 NOTE — Progress Notes (Signed)
   Department of Radiation Oncology  Phone:  [phone] Fax:        [phone]   IMRT device note  Today the patient began his IM RT directed at the head and neck area. He will be treated with helical IMRT on the tomotherapy and unit. Patient had construction of his IMRT device today. Patient be treated with 9.2 sinogram segments.  -----------------------------------  Billie Lade, PhD, MD

## 2012-02-05 NOTE — Progress Notes (Signed)
Infusion complete, patient tolerated well.  Instructed to call us with any concerns.

## 2012-02-06 ENCOUNTER — Ambulatory Visit
Admission: RE | Admit: 2012-02-06 | Discharge: 2012-02-06 | Disposition: A | Discharge status: Home / Self Care | Payer: Federal, State, Local not specified - PPO | Source: Ambulatory Visit | Attending: Radiation Oncology | Admitting: Radiation Oncology

## 2012-02-06 ENCOUNTER — Encounter: Payer: Self-pay | Admitting: Radiation Oncology

## 2012-02-06 VITALS — BP 117/68 | HR 66 | Temp 96.9°F | Resp 20 | Wt 173.2 lb

## 2012-02-06 DIAGNOSIS — C099 Malignant neoplasm of tonsil, unspecified: Secondary | ICD-10-CM

## 2012-02-06 MED ORDER — RADIAPLEXRX EX GEL
Freq: Once | CUTANEOUS | Status: AC
  Administered 2012-02-06: 10:00:00 via TOPICAL

## 2012-02-06 NOTE — Progress Notes (Signed)
Post sim ed completed w/pt who is a retired [employer]. Gave pt "Radiation and You" booklet, all pertinent pages marked and discussed. Gave pt Radiaplex lotion w/instructions. Pt verbalized understanding.  Pt continues to eat regular foods, flushing peg tube w/water. Pt does not have appt w/dietician, and states he "knows what to eat, can blend anything to put in his tube". Pt states he has already gained 10 lbs.   Denies pain,difficulty swallowing, loss of appetite, fatigue.

## 2012-02-06 NOTE — Progress Notes (Signed)
Weekly Management Note Current Dose:4.0 Gy  Projected Dose:70.0 Gy   Narrative:  The patient presents for routine under treatment assessment. MVCT images were reviewed.  The chart was checked.  He is tolerating this his treatment well thus far without any side effects.  Physical Findings: The lungs are clear. The heart has a regular rhythm and rate. Examination of the neck reveals no palpable adenopathy at this time showing excellent response to his neoadjuvant chemotherapy. The oral cavity reveals mucosa to be moist without secondary infection.  There is no obvious the tonsillar tumor on direct inspection.  Vitals:  Filed Vitals:   02/06/12 0905  BP: 117/68  Pulse: 66  Temp: 96.9 F (36.1 C)  Resp: 20   Weight:  Wt Readings from Last 3 Encounters:  02/06/12 173 lb 3.2 oz (78.563 kg)  01/31/12 165 lb (74.844 kg)  01/24/12 162 lb (73.483 kg)   Lab Results  Component Value Date   WBC 6.4 02/05/2012   HGB 9.9* 02/05/2012   HCT 29.4* 02/05/2012   MCV 94.5 02/05/2012   PLT 207 02/05/2012   Lab Results  Component Value Date   CREATININE 1.00 02/05/2012   BUN 13 02/05/2012   NA 138 02/05/2012   K 4.3 02/05/2012   CL 103 02/05/2012   CO2 30 02/05/2012     Impression:  The patient is tolerating radiation well without any side effects.  Plan:  Continue treatment as planned along with radiosensitizing chemotherapy.   -----------------------------------  Billie Lade, PhD, MD

## 2012-02-06 NOTE — Addendum Note (Signed)
Encounter addended by: Glennie Hawk, RN on: 02/06/2012  9:36 AM<BR>     Documentation filed: Inpatient MAR, Orders

## 2012-02-07 ENCOUNTER — Ambulatory Visit
Admission: RE | Admit: 2012-02-07 | Discharge: 2012-02-07 | Disposition: A | Discharge status: Home / Self Care | Payer: Federal, State, Local not specified - PPO | Source: Ambulatory Visit | Attending: Radiation Oncology | Admitting: Radiation Oncology

## 2012-02-08 ENCOUNTER — Ambulatory Visit
Admission: RE | Admit: 2012-02-08 | Discharge: 2012-02-08 | Disposition: A | Discharge status: Home / Self Care | Payer: Federal, State, Local not specified - PPO | Source: Ambulatory Visit | Attending: Radiation Oncology | Admitting: Radiation Oncology

## 2012-02-09 ENCOUNTER — Ambulatory Visit
Admission: RE | Admit: 2012-02-09 | Discharge: 2012-02-09 | Disposition: A | Discharge status: Home / Self Care | Payer: Federal, State, Local not specified - PPO | Source: Ambulatory Visit | Attending: Radiation Oncology | Admitting: Radiation Oncology

## 2012-02-12 ENCOUNTER — Inpatient Hospital Stay (HOSPITAL_COMMUNITY): Payer: Federal, State, Local not specified - PPO

## 2012-02-12 ENCOUNTER — Ambulatory Visit
Admission: RE | Admit: 2012-02-12 | Discharge: 2012-02-12 | Disposition: A | Discharge status: Home / Self Care | Payer: Federal, State, Local not specified - PPO | Source: Ambulatory Visit | Attending: Radiation Oncology | Admitting: Radiation Oncology

## 2012-02-12 ENCOUNTER — Encounter (HOSPITAL_BASED_OUTPATIENT_CLINIC_OR_DEPARTMENT_OTHER): Payer: Federal, State, Local not specified - PPO

## 2012-02-12 VITALS — BP 116/73 | HR 75 | Temp 98.9°F | Wt 163.5 lb

## 2012-02-12 VITALS — BP 112/63 | HR 74 | Temp 98.8°F

## 2012-02-12 DIAGNOSIS — C099 Malignant neoplasm of tonsil, unspecified: Secondary | ICD-10-CM

## 2012-02-12 DIAGNOSIS — Z5111 Encounter for antineoplastic chemotherapy: Secondary | ICD-10-CM

## 2012-02-12 DIAGNOSIS — B977 Papillomavirus as the cause of diseases classified elsewhere: Secondary | ICD-10-CM

## 2012-02-12 LAB — BASIC METABOLIC PANEL
BUN: 21 mg/dL (ref 6–23)
Calcium: 8.8 mg/dL (ref 8.4–10.5)
Chloride: 98 mEq/L (ref 96–112)
Creatinine, Ser: 1.14 mg/dL (ref 0.50–1.35)
GFR calc Af Amer: 72 mL/min — ABNORMAL LOW (ref >90)

## 2012-02-12 LAB — CBC
HCT: 28.9 % — ABNORMAL LOW (ref 39.0–52.0)
Hemoglobin: 10.1 g/dL — ABNORMAL LOW (ref 13.0–17.0)
MCH: 32.5 pg (ref 26.0–34.0)
MCHC: 34.9 g/dL (ref 30.0–36.0)
MCV: 92.9 fL (ref 78.0–100.0)
RDW: 16 % — ABNORMAL HIGH (ref 11.5–15.5)

## 2012-02-12 LAB — DIFFERENTIAL
Basophils Absolute: 0 10*3/uL (ref 0.0–0.1)
Basophils Relative: 0 % (ref 0–1)
Eosinophils Absolute: 0.1 10*3/uL (ref 0.0–0.7)
Eosinophils Relative: 1 % (ref 0–5)
Monocytes Absolute: 1.1 10*3/uL — ABNORMAL HIGH (ref 0.1–1.0)
Monocytes Relative: 13 % — ABNORMAL HIGH (ref 3–12)

## 2012-02-12 MED ORDER — SODIUM CHLORIDE 0.9 % IJ SOLN
INTRAMUSCULAR | Status: AC
  Filled 2012-02-12: qty 10

## 2012-02-12 MED ORDER — SODIUM CHLORIDE 0.9 % IV SOLN
Freq: Once | INTRAVENOUS | Status: AC
  Administered 2012-02-12: 16 mg via INTRAVENOUS
  Filled 2012-02-12: qty 8

## 2012-02-12 MED ORDER — SODIUM CHLORIDE 0.9 % IV SOLN: 16.0000 mg | Freq: Once | INTRAVENOUS | Status: DC

## 2012-02-12 MED ORDER — SODIUM CHLORIDE 0.9 % IV SOLN
40.0000 mg/m2 | Freq: Once | INTRAVENOUS | Status: AC
  Administered 2012-02-12: 75 mg via INTRAVENOUS
  Filled 2012-02-12: qty 75

## 2012-02-12 MED ORDER — SODIUM CHLORIDE 0.9 % IJ SOLN
10.0000 mL | INTRAMUSCULAR | Status: DC | PRN
  Administered 2012-02-12: 10 mL
  Filled 2012-02-12: qty 10

## 2012-02-12 MED ORDER — SODIUM CHLORIDE 0.9 % IV SOLN
Freq: Once | INTRAVENOUS | Status: AC
  Administered 2012-02-12: 11:00:00 via INTRAVENOUS

## 2012-02-12 MED ORDER — DEXAMETHASONE SODIUM PHOSPHATE 4 MG/ML IJ SOLN: 20.0000 mg | Freq: Once | INTRAMUSCULAR | Status: DC

## 2012-02-12 MED ORDER — HEPARIN SOD (PORK) LOCK FLUSH 100 UNIT/ML IV SOLN
500.0000 [IU] | Freq: Once | INTRAVENOUS | Status: AC | PRN
  Administered 2012-02-12: 500 [IU]
  Filled 2012-02-12: qty 5

## 2012-02-12 MED ORDER — HEPARIN SOD (PORK) LOCK FLUSH 100 UNIT/ML IV SOLN
INTRAVENOUS | Status: AC
  Filled 2012-02-12: qty 5

## 2012-02-12 NOTE — Progress Notes (Signed)
Tolerated chemo well. 

## 2012-02-12 NOTE — Progress Notes (Signed)
   Department of Radiation Oncology  Phone:  [phone] Fax:        [phone]    Weekly Management Note Current Dose:12.0 Gy  Projected Dose:70.0 Gy   Narrative:  The patient presents for routine under treatment assessment.  CBCT/MVCT images/Port film x-rays were reviewed.  The chart was checked.  He continues to tolerate his radiation therapy well without any appreciable side effects. Patient specifically denies any taste changes sore throat  or dysphagia.  He is scheduled for additional chemotherapy later today at the [facility].  Physical Findings:  The lungs are clear. The heart has a regular rhythm and rate. Examination of neck reveals some slight hyper pigmentation changes. The nasal cavity reveals mucosa moist without secondary infection area. There appears to be a ulcerative area along the right soft palate area which may  possibly communicate with the pharyngeal area. There is no obvious signs of infection in this area.  This area is estimated to be approximately 3 x 5 mm.  Vitals:  Filed Vitals:   02/12/12 0847  BP: 116/73  Pulse: 75  Temp: 98.9 F (37.2 C)   Weight:  Wt Readings from Last 3 Encounters:  02/12/12 163 lb 8 oz (74.163 kg)  02/06/12 173 lb 3.2 oz (78.563 kg)  01/31/12 165 lb (74.844 kg)   Lab Results  Component Value Date   WBC 8.7 02/12/2012   HGB 10.1* 02/12/2012   HCT 28.9* 02/12/2012   MCV 92.9 02/12/2012   PLT 227 02/12/2012   Lab Results  Component Value Date   CREATININE 1.14 02/12/2012   BUN 21 02/12/2012   NA 134* 02/12/2012   K 4.0 02/12/2012   CL 98 02/12/2012   CO2 30 02/12/2012     Impression:  The patient is tolerating radiation well.   Plan:  Continue treatment as planned.

## 2012-02-12 NOTE — Progress Notes (Signed)
Routine weekly under treat visit with md for tonsillar radiation.Has completed 6 treatments.Denies pain or any other changes in status. Scheduled for chemotherapy at [facility] today.

## 2012-02-13 ENCOUNTER — Encounter: Payer: Self-pay | Admitting: Nutrition

## 2012-02-13 ENCOUNTER — Encounter: Payer: Federal, State, Local not specified - PPO | Admitting: Nutrition

## 2012-02-13 ENCOUNTER — Ambulatory Visit
Admission: RE | Admit: 2012-02-13 | Discharge: 2012-02-13 | Disposition: A | Discharge status: Home / Self Care | Payer: Federal, State, Local not specified - PPO | Source: Ambulatory Visit | Attending: Radiation Oncology | Admitting: Radiation Oncology

## 2012-02-13 NOTE — Progress Notes (Signed)
Patient did not show for nutrition appointment. 

## 2012-02-14 ENCOUNTER — Ambulatory Visit
Admission: RE | Admit: 2012-02-14 | Discharge: 2012-02-14 | Disposition: A | Discharge status: Home / Self Care | Payer: Federal, State, Local not specified - PPO | Source: Ambulatory Visit | Attending: Radiation Oncology | Admitting: Radiation Oncology

## 2012-02-15 ENCOUNTER — Ambulatory Visit
Admission: RE | Admit: 2012-02-15 | Discharge: 2012-02-15 | Disposition: A | Discharge status: Home / Self Care | Payer: Federal, State, Local not specified - PPO | Source: Ambulatory Visit | Attending: Radiation Oncology | Admitting: Radiation Oncology

## 2012-02-16 ENCOUNTER — Ambulatory Visit
Admission: RE | Admit: 2012-02-16 | Discharge: 2012-02-16 | Disposition: A | Discharge status: Home / Self Care | Payer: Federal, State, Local not specified - PPO | Source: Ambulatory Visit | Attending: Radiation Oncology | Admitting: Radiation Oncology

## 2012-02-19 ENCOUNTER — Telehealth: Payer: Self-pay | Admitting: Radiation Oncology

## 2012-02-19 ENCOUNTER — Encounter (HOSPITAL_BASED_OUTPATIENT_CLINIC_OR_DEPARTMENT_OTHER): Payer: Federal, State, Local not specified - PPO | Admitting: Oncology

## 2012-02-19 ENCOUNTER — Ambulatory Visit
Admission: RE | Admit: 2012-02-19 | Discharge: 2012-02-19 | Disposition: A | Discharge status: Home / Self Care | Payer: Federal, State, Local not specified - PPO | Source: Ambulatory Visit | Attending: Radiation Oncology | Admitting: Radiation Oncology

## 2012-02-19 ENCOUNTER — Encounter (HOSPITAL_BASED_OUTPATIENT_CLINIC_OR_DEPARTMENT_OTHER): Payer: Federal, State, Local not specified - PPO

## 2012-02-19 ENCOUNTER — Encounter (HOSPITAL_COMMUNITY): Payer: Self-pay | Admitting: Oncology

## 2012-02-19 VITALS — BP 118/71 | HR 98 | Temp 98.0°F

## 2012-02-19 VITALS — BP 111/70 | HR 68 | Temp 97.8°F | Wt 157.6 lb

## 2012-02-19 DIAGNOSIS — Z5111 Encounter for antineoplastic chemotherapy: Secondary | ICD-10-CM

## 2012-02-19 DIAGNOSIS — C099 Malignant neoplasm of tonsil, unspecified: Secondary | ICD-10-CM

## 2012-02-19 DIAGNOSIS — B370 Candidal stomatitis: Secondary | ICD-10-CM

## 2012-02-19 DIAGNOSIS — B977 Papillomavirus as the cause of diseases classified elsewhere: Secondary | ICD-10-CM

## 2012-02-19 LAB — DIFFERENTIAL
Lymphocytes Relative: 5 % — ABNORMAL LOW (ref 12–46)
Monocytes Absolute: 0.8 10*3/uL (ref 0.1–1.0)
Monocytes Relative: 7 % (ref 3–12)
Neutro Abs: 10.8 10*3/uL — ABNORMAL HIGH (ref 1.7–7.7)

## 2012-02-19 LAB — COMPREHENSIVE METABOLIC PANEL
AST: 9 U/L (ref 0–37)
BUN: 23 mg/dL (ref 6–23)
CO2: 29 mEq/L (ref 19–32)
Chloride: 95 mEq/L — ABNORMAL LOW (ref 96–112)
Creatinine, Ser: 1.25 mg/dL (ref 0.50–1.35)
GFR calc non Af Amer: 55 mL/min — ABNORMAL LOW (ref >90)
Total Bilirubin: 0.4 mg/dL (ref 0.3–1.2)

## 2012-02-19 LAB — CBC
HCT: 27.1 % — ABNORMAL LOW (ref 39.0–52.0)
Hemoglobin: 9.5 g/dL — ABNORMAL LOW (ref 13.0–17.0)
WBC: 12.3 10*3/uL — ABNORMAL HIGH (ref 4.0–10.5)

## 2012-02-19 MED ORDER — SODIUM CHLORIDE 0.9 % IV SOLN
Freq: Once | INTRAVENOUS | Status: AC
  Administered 2012-02-19: 1500 mL via INTRAVENOUS

## 2012-02-19 MED ORDER — SODIUM CHLORIDE 0.9 % IJ SOLN
10.0000 mL | INTRAMUSCULAR | Status: DC | PRN
  Filled 2012-02-19: qty 10

## 2012-02-19 MED ORDER — SODIUM CHLORIDE 0.9 % IV SOLN: 16.0000 mg | Freq: Once | INTRAVENOUS | Status: DC

## 2012-02-19 MED ORDER — HEPARIN SOD (PORK) LOCK FLUSH 100 UNIT/ML IV SOLN
500.0000 [IU] | Freq: Once | INTRAVENOUS | Status: AC | PRN
  Administered 2012-02-19: 500 [IU]
  Filled 2012-02-19: qty 5

## 2012-02-19 MED ORDER — MAGIC MOUTHWASH: 5.0000 mL | Freq: Four times a day (QID) | ORAL | Status: DC | PRN

## 2012-02-19 MED ORDER — DEXAMETHASONE SODIUM PHOSPHATE 4 MG/ML IJ SOLN: 20.0000 mg | Freq: Once | INTRAMUSCULAR | Status: DC

## 2012-02-19 MED ORDER — SODIUM CHLORIDE 0.9 % IV SOLN
Freq: Once | INTRAVENOUS | Status: AC
  Administered 2012-02-19: 16 mg via INTRAVENOUS
  Filled 2012-02-19: qty 8

## 2012-02-19 MED ORDER — SODIUM CHLORIDE 0.9 % IJ SOLN
INTRAMUSCULAR | Status: AC
  Filled 2012-02-19: qty 10

## 2012-02-19 MED ORDER — SODIUM CHLORIDE 0.9 % IV SOLN
40.0000 mg/m2 | Freq: Once | INTRAVENOUS | Status: AC
  Administered 2012-02-19: 75 mg via INTRAVENOUS
  Filled 2012-02-19: qty 75

## 2012-02-19 NOTE — Telephone Encounter (Signed)
Pt expressed concerns for gas/transporation to the rad therapist. I gave pt an EPP & MCD appl to complete and return, as well as, sent referral to [facility] for addl asst at this time.  Pt has a [place] address listed in Epic, I left vm for pt to get his updated address for [place] government asst.  02/20/2012: Pt retrn'd today with completed EPP and MCD appl to be processed. Pt gave me his updated [place] address. I also, gave him the [place] [place] asst appl and will forward his MCD to [facility] DSS at this time. Pt advised he does have a home in [place], as well as, [place].   Update Address: [address], [place]  [address]  [facility])

## 2012-02-19 NOTE — Progress Notes (Signed)
Tolerated well

## 2012-02-19 NOTE — Progress Notes (Signed)
Austin Punt, MD [address] 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 Thrush  Alum & Mag Hydroxide-Simeth (MAGIC MOUTHWASH) SOLN    CURRENT THERAPY:S/P 2 cycles of cisplatin weekly with concomitant radiation.  S/P 2 cycle of Cisplatin Day 1 and 5-FU continuous infusion days 1-4 every 21 days and weekly cetuximab. S/P tooth extraction in preparation for concomitant chemoradiation with weekly cisplatin. Anticipated to embark on radiation on 02/05/2012.   INTERVAL HISTORY: Austin Mathis 71 y.o. male returns for  regular  visit for followup of bilateral neck adenopathy from a primary in the tonsil on the best 4.3 x 2.7 cm in size. His biopsy showed squamous cell carcinoma consistent with a tonsillar primary with bilateral cervical adenopathy and the PET scan showed no distant disease. 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   Patient accompanied by his daughter today.   Austin Mathis reports that yesterday he had shaking chills and "could not get warm."  He explains that yesterday was a bad day for him  With some nausea as well.  He denies any diarrhea.  He reports that today he feels much better.  No documented fevers appreciated.  Patient education regarding fevers provided to the patient and he knows to report fevers greater than 100.4 F.    Austin Mathis admits to oral tenderness and a sore throat.  He denies any yellow/green sputum production.  He does admit to clear/white sputum production.  He denies a cough.  I will give him Magic Mouthwash for his oral thrush appreciated on physical exam today.  I have offered him a Theatre stage manager (viscous lidocaine) and he has declined at this time.  He reports that he is swallowing his food appropriately right now and it is not causing him  too much discomfort.  He will call us if he changes his mind.  He admits to decreased appetite.  He is forcing 3 meals per day and supplementing with ensure.  I have encouraged him not to force feed when he is nauseated for this will worsen his nausea.  He understands.  He has lost 5 lbs since last week.  Will monitor weight loss.   Otherwise, Austin Mathis is doing well.  He is tolerating therapy well thus far with minimal side effects.  I suspect that he had a 24 hour viral infection yesterday.   Past Medical History  Diagnosis Date  . High cholesterol   . Hearing deficit     Right greater than left.  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 Hypertension     EKG 5/13, chest CT 4/13 EP[facility], clearance with OV 01/08/12 oncology [facility]  . GERD (gastroesophageal reflux disease)   . Tonsillar cancer     last chemo 01/01/12  . Primary squamous cell carcinoma of tonsil 11/16/2011    has Primary squamous cell carcinoma of tonsil; Hypertension; Hypercholesterolemia; Herpetic lesions of face; and Encounter for PEG (percutaneous endoscopic gastrostomy) on his problem list.      has no known allergies.  Mr. Austin Mathis does not currently have medications on file.  Past Surgical History  Procedure Date  . Portacath placement 11/24/2011    left side  . Multiple extractions with alveoloplasty 01/10/2012    Procedure: MULTIPLE EXTRACION WITH ALVEOLOPLASTY;  Surgeon: Charlynne Pander, DDS;  Location: [facility];  Service: Oral Surgery;  Laterality: N/A;  Extraction of tooth #'s 1,3,4,5,6,7,8,9,10,11,12,13,14,15,16,17,18,20,21,22, 23,24,25,26,27,28,29,30 with alveoloplasty.  . Esophagogastroduodenoscopy 01/16/2012    Procedure: ESOPHAGOGASTRODUODENOSCOPY (EGD);  Surgeon: West Bali, MD;  Location: [facility] END[facility] SUITE;  Service: Endoscopy;  Laterality: N/A;  9:30  . Peg placement 01/16/2012    Procedure: PERCUTANEOUS ENDOSCOPIC GASTROSTOMY (PEG) PLACEMENT;  Surgeon: West Bali, MD;  Location: [facility] SU[facility];   Service: Endoscopy;  Laterality: N/A;  Ancef one gram IV before procedure    Denies any headaches, dizziness, double vision, fevers, chills, night sweats, nausea, vomiting, diarrhea, constipation, chest pain, heart palpitations, shortness of breath, blood in stool, black tarry stool, urinary pain, urinary burning, urinary frequency, hematuria.   PHYSICAL EXAMINATION  ECOG PERFORMANCE STATUS: 1 - Symptomatic but completely ambulatory  Filed Vitals:   02/19/12 0923  BP: 111/70  Pulse: 68  Temp: 97.8 F (36.6 C)    GENERAL:alert, no distress, well nourished, well developed, comfortable, cooperative and smiling SKIN: skin color, texture, turgor are normal, no rashes or significant lesions HEAD: Normocephalic, No masses, lesions, tenderness or abnormalities EYES: normal, Conjunctiva are pink and non-injected EARS: External ears normal OROPHARYNX:edentulous and thrush on tongue and buccal mucosa.  NECK: supple, no adenopathy, thyroid normal size, non-tender, without nodularity, no stridor, non-tender, trachea midline LYMPH:  no palpable lymphadenopathy BREAST:not examined LUNGS: clear to auscultation and percussion HEART: regular rate & rhythm, no murmurs, no gallops, S1 normal and S2 normal ABDOMEN:abdomen soft, non-tender and normal bowel sounds BACK: Back symmetric, no curvature. EXTREMITIES:less then 2 second capillary refill, no joint deformities, effusion, or inflammation, no edema, no skin discoloration, no clubbing, no cyanosis  NEURO: alert & oriented x 3 with fluent speech, no focal motor/sensory deficits, gait normal    LABORATORY DATA: CBC    Component Value Date/Time   WBC 8.7 02/12/2012 0941   RBC 3.11* 02/12/2012 0941   HGB 10.1* 02/12/2012 0941   HCT 28.9* 02/12/2012 0941   PLT 227 02/12/2012 0941   MCV 92.9 02/12/2012 0941   MCH 32.5 02/12/2012 0941   MCHC 34.9 02/12/2012 0941   RDW 16.0* 02/12/2012 0941   LYMPHSABS 0.9 02/12/2012 0941   MONOABS 1.1* 02/12/2012 0941     EOSABS 0.1 02/12/2012 0941   BASOSABS 0.0 02/12/2012 0941      Chemistry      Component Value Date/Time   NA 134* 02/12/2012 0941   K 4.0 02/12/2012 0941   CL 98 02/12/2012 0941   CO2 30 02/12/2012 0941   BUN 21 02/12/2012 0941   CREATININE 1.14 02/12/2012 0941      Component Value Date/Time   CALCIUM 8.8 02/12/2012 0941   ALKPHOS 66 02/05/2012 1015   AST 15 02/05/2012 1015   ALT 9 02/05/2012 1015   BILITOT 0.3 02/05/2012 1015        PATHOLOGY: Right tonsillar mass, invasive squamous cell carcinoma, HPV+, strong p16 immunohistochemical staining.     ASSESSMENT:  1. Bilateral neck adenopathy from a primary in the tonsil on the best 4.3 x 2.7 cm in size. His biopsy showed squamous cell carcinoma consistent with a tonsillar primary with bilateral cervical adenopathy and the PET scan showed no distant disease. 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  2. Herpetic lesions of the lips, resolved. On prophylactic acyclovir.  3. Oral thrush 4. ?Viral infection.  PLAN:  1. I personally reviewed and went over laboratory results with the patient. 2. E-scribed Magic Mouthwash to [facility]. 3. Pre-chemo lab work  today as ordered and before each cycle of chemotherapy.  4. Pre-chemo lab work ordered for 3 weeks: CBC diff, CMET/BMET 5. Offered Magic Swizzle (viscous lidocaine).  Patient declined.  6. Continue PEG flushes as directed.  7. Continue with chemotherapy as scheduled.  8. Return in 3 weeks for follow-up.   All questions were answered. The patient knows to call the clinic with any problems, questions or concerns. We can certainly see the patient much sooner if necessary.  The patient and plan discussed with Glenford Peers, MD and he is in agreement with the aforementioned.  KEFALAS,THOMAS

## 2012-02-19 NOTE — Patient Instructions (Addendum)
Armarion Greek  [id number] Feb 02, 1941 Dr. Glenford Peers   [facility]  Discharge Instructions  RECOMMENDATIONS MADE BY THE CONSULTANT AND ANY TEST RESULTS WILL BE SENT TO YOUR REFERRING DOCTOR.   EXAM FINDINGS BY MD TODAY AND SIGNS AND SYMPTOMS TO REPORT TO CLINIC OR PRIMARY MD: Exam and discussion by PA.  MEDICATIONS PRESCRIBED: Magic Mouthwash 5 ml 4 times daily Follow label directions  INSTRUCTIONS GIVEN AND DISCUSSED: Other :  Report fevers, chills, uncontrolled nausea, vomiting or pain.  SPECIAL INSTRUCTIONS/FOLLOW-UP: Return to Clinic as scheduled for chemotherapy and in 3 weeks to see PA.   I acknowledge that I have been informed and understand all the instructions given to me and received a copy. I do not have any more questions at this time, but understand that I may call the Specialty Clinic at [facility] at [phone] during business hours should I have any further questions or need assistance in obtaining follow-up care.    __________________________________________  _____________  __________ Signature of Patient or Authorized Representative            Date                   Time    __________________________________________ Nurse's Signature

## 2012-02-20 ENCOUNTER — Ambulatory Visit
Admission: RE | Admit: 2012-02-20 | Discharge: 2012-02-20 | Disposition: A | Discharge status: Home / Self Care | Payer: Federal, State, Local not specified - PPO | Source: Ambulatory Visit | Attending: Radiation Oncology | Admitting: Radiation Oncology

## 2012-02-20 ENCOUNTER — Encounter: Payer: Self-pay | Admitting: Radiation Oncology

## 2012-02-20 VITALS — BP 157/80 | HR 69 | Temp 97.2°F | Resp 20 | Wt 161.4 lb

## 2012-02-20 DIAGNOSIS — C099 Malignant neoplasm of tonsil, unspecified: Secondary | ICD-10-CM

## 2012-02-20 NOTE — Progress Notes (Signed)
Weekly Management Note Current Dose:24.0 Gy  Projected Dose:70.0 Gy   Narrative:  The patient presents for routine under treatment assessment.  CBCT/MVCT images/Port film x-rays were reviewed.  The chart was checked.  The patient developed chills over the weekend. He was seen in medical oncology yesterday in [place]. Patient was diagnosed with the candida infection and started on Magic mouthwash. This has helped his throat and mouth in terms of discomfort. The patient is also taking Advil which is very helpful. Patient is hesitant to use narcotics.    Physical Findings:  Mild erythema to the skin of the neck.  The dorsum of the tongue shows a whitish discoloration. The pharyngeal area shows some mild erythema but no significant mucositis at this time.  Vitals:  Filed Vitals:   02/20/12 0839  BP: 157/80  Pulse: 69  Temp: 97.2 F (36.2 C)  Resp: 20   Weight:  Wt Readings from Last 3 Encounters:  02/20/12 161 lb 6.4 oz (73.211 kg)  02/19/12 157 lb 9.6 oz (71.487 kg)  02/12/12 163 lb 8 oz (74.163 kg)   Lab Results  Component Value Date   WBC 12.3* 02/19/2012   HGB 9.5* 02/19/2012   HCT 27.1* 02/19/2012   MCV 93.8 02/19/2012   PLT 168 02/19/2012   Lab Results  Component Value Date   CREATININE 1.25 02/19/2012   BUN 23 02/19/2012   NA 132* 02/19/2012   K 4.2 02/19/2012   CL 95* 02/19/2012   CO2 29 02/19/2012     Impression:  The patient is tolerating radiation with expected side effects.  The patient has started using Ensure through his feeding tube.  Plan:  Continue treatment as planned. -----------------------------------  Billie Lade, PhD, MD

## 2012-02-20 NOTE — Progress Notes (Signed)
Pattient alert,oriented x3, taking MMW for thrush, has hiccups after chemotherapy, declines to tke thorazine, no chills today, took 2 advil this am  Feels a lot better, no fever, had cisplatin  Yesterday, fatigue improved since yesterday stated, completed 12/35 rad tx, using radiaplex gel bid, had scrambled eggs,sausage, toast oj this am, ensure via peg daily, with free water before and after feedings,  8:45 AM  8:44 AM

## 2012-02-21 ENCOUNTER — Ambulatory Visit (HOSPITAL_COMMUNITY): Payer: Medicaid - Dental | Admitting: Dentistry

## 2012-02-21 ENCOUNTER — Telehealth: Payer: Self-pay | Admitting: Radiation Oncology

## 2012-02-21 ENCOUNTER — Ambulatory Visit
Admission: RE | Admit: 2012-02-21 | Discharge: 2012-02-21 | Disposition: A | Discharge status: Home / Self Care | Payer: Federal, State, Local not specified - PPO | Source: Ambulatory Visit | Attending: Radiation Oncology | Admitting: Radiation Oncology

## 2012-02-21 ENCOUNTER — Encounter (HOSPITAL_COMMUNITY): Payer: Self-pay | Admitting: Dentistry

## 2012-02-21 VITALS — BP 124/84 | HR 66 | Wt 161.0 lb

## 2012-02-21 DIAGNOSIS — K1230 Oral mucositis (ulcerative), unspecified: Secondary | ICD-10-CM

## 2012-02-21 DIAGNOSIS — R432 Parageusia: Secondary | ICD-10-CM

## 2012-02-21 DIAGNOSIS — K117 Disturbances of salivary secretion: Secondary | ICD-10-CM

## 2012-02-21 DIAGNOSIS — K121 Other forms of stomatitis: Secondary | ICD-10-CM

## 2012-02-21 DIAGNOSIS — K1233 Oral mucositis (ulcerative) due to radiation: Secondary | ICD-10-CM

## 2012-02-21 DIAGNOSIS — B370 Candidal stomatitis: Secondary | ICD-10-CM

## 2012-02-21 DIAGNOSIS — R1310 Dysphagia, unspecified: Secondary | ICD-10-CM

## 2012-02-21 NOTE — Telephone Encounter (Signed)
MCD application and bills mailed to [facility] DSS today and Jaclyn Shaggy application submitted via email to:   [email or web address]  [email or web address]

## 2012-02-21 NOTE — Progress Notes (Signed)
Wednesday, February 21, 2012   BP 124/84  Pulse 66  Wt 161 lb (73.029 kg)  Patient was 164 lbs at start of treatments.  Austin Mathis is a 71 year old male with recent diagnosis of SCCa of the Right Tonsil. Patient now presents for a periodic oral examination during chemoradiation therapy.   Patient has completed 13/35  of radiation treatments.  REVIEW OF CHIEF COMPLAINTS: DRY MOUTH:  Yes. HARD TO SWALLOW: Yes. HURT TO SWALLOW: Has pain sometimes TASTE CHANGES: Taste is gone SORES IN MOUTH: No TRISMUS SYMPTOMS: Doing trismus exercises and having no problems. SYMPTOM RELIEF:  Using salt water, and Magic Mouthwash. HOME ORAL HYGIENE REGIMEN: Edentulous. TRISMUS EXERCISES: Maximum interincisal opening: 60 mm.   DENTAL EXAM: ORAL HYGIENE(PLAQUE): Edentulous. LOCATION OF MUCOSITIS: right buccal mucosa and tonsil area. DESCRIPTION OF SALIVA: Decreased. ANY EXPOSED BONE: None noted OTHER WATCHED AREAS: oral candidiasis of back of throat now on "oral rinse" by patient report. DIAGNOSES: 1.  Xerostomia  2.  Dysgeusia 3.  Dysphagia 4.  Odynophagia 5.  Mucositis 6.  Weight Loss 7.  Oral candidiasis-back of throat  RECOMMENDATIONS: 1. Brush tongue twice daily. 2. Use trismus exercises as directed. 3. Use Biotene Rinse or salt water/baking soda rinses. 4. Have MD's consider diflucan therapy for the oral candidiasis. 5. Multiple sips of water as needed. 6. RTC in two months for periodic oral examination status post radiation therapy . Call if problems before then. Dr. Kristin Bruins

## 2012-02-21 NOTE — Telephone Encounter (Signed)
Indigent DENIED Family Size: 2 HH INC: [phone] MOD POV: 22,980.00 Valid Dates: 02/21/2012 - 08/23/2012  DENIED - OVER QUALIFIED

## 2012-02-22 ENCOUNTER — Ambulatory Visit
Admission: RE | Admit: 2012-02-22 | Discharge: 2012-02-22 | Disposition: A | Discharge status: Home / Self Care | Payer: Federal, State, Local not specified - PPO | Source: Ambulatory Visit | Attending: Radiation Oncology | Admitting: Radiation Oncology

## 2012-02-23 ENCOUNTER — Ambulatory Visit
Admission: RE | Admit: 2012-02-23 | Discharge: 2012-02-23 | Disposition: A | Discharge status: Home / Self Care | Payer: Federal, State, Local not specified - PPO | Source: Ambulatory Visit | Attending: Radiation Oncology | Admitting: Radiation Oncology

## 2012-02-26 ENCOUNTER — Encounter (HOSPITAL_COMMUNITY): Payer: Federal, State, Local not specified - PPO | Attending: Oncology

## 2012-02-26 ENCOUNTER — Ambulatory Visit
Admission: RE | Admit: 2012-02-26 | Discharge: 2012-02-26 | Disposition: A | Discharge status: Home / Self Care | Payer: Federal, State, Local not specified - PPO | Source: Ambulatory Visit | Attending: Radiation Oncology | Admitting: Radiation Oncology

## 2012-02-26 VITALS — BP 133/76 | HR 80 | Temp 98.2°F | Resp 18 | Wt 158.2 lb

## 2012-02-26 DIAGNOSIS — Z5111 Encounter for antineoplastic chemotherapy: Secondary | ICD-10-CM

## 2012-02-26 DIAGNOSIS — B977 Papillomavirus as the cause of diseases classified elsewhere: Secondary | ICD-10-CM

## 2012-02-26 DIAGNOSIS — C099 Malignant neoplasm of tonsil, unspecified: Secondary | ICD-10-CM | POA: Insufficient documentation

## 2012-02-26 DIAGNOSIS — B370 Candidal stomatitis: Secondary | ICD-10-CM

## 2012-02-26 DIAGNOSIS — K219 Gastro-esophageal reflux disease without esophagitis: Secondary | ICD-10-CM | POA: Insufficient documentation

## 2012-02-26 DIAGNOSIS — D649 Anemia, unspecified: Secondary | ICD-10-CM | POA: Insufficient documentation

## 2012-02-26 LAB — DIFFERENTIAL
Basophils Absolute: 0 10*3/uL (ref 0.0–0.1)
Basophils Relative: 0 % (ref 0–1)
Eosinophils Absolute: 0 10*3/uL (ref 0.0–0.7)
Eosinophils Relative: 0 % (ref 0–5)
Lymphocytes Relative: 3 % — ABNORMAL LOW (ref 12–46)
Monocytes Absolute: 0.4 10*3/uL (ref 0.1–1.0)

## 2012-02-26 LAB — CBC
HCT: 26.8 % — ABNORMAL LOW (ref 39.0–52.0)
MCH: 32.6 pg (ref 26.0–34.0)
MCHC: 34.7 g/dL (ref 30.0–36.0)
MCV: 94 fL (ref 78.0–100.0)
RDW: 15.5 % (ref 11.5–15.5)

## 2012-02-26 LAB — BASIC METABOLIC PANEL
CO2: 32 mEq/L (ref 19–32)
Calcium: 9.1 mg/dL (ref 8.4–10.5)
Creatinine, Ser: 1.18 mg/dL (ref 0.50–1.35)
GFR calc non Af Amer: 59 mL/min — ABNORMAL LOW (ref >90)

## 2012-02-26 MED ORDER — SODIUM CHLORIDE 0.9 % IV SOLN
Freq: Once | INTRAVENOUS | Status: AC
  Administered 2012-02-26: 11:00:00 via INTRAVENOUS

## 2012-02-26 MED ORDER — FLUCONAZOLE 100 MG PO TABS: ORAL_TABLET | ORAL | Status: DC

## 2012-02-26 MED ORDER — HEPARIN SOD (PORK) LOCK FLUSH 100 UNIT/ML IV SOLN
500.0000 [IU] | Freq: Once | INTRAVENOUS | Status: AC | PRN
  Administered 2012-02-26: 500 [IU]
  Filled 2012-02-26: qty 5

## 2012-02-26 MED ORDER — HEPARIN SOD (PORK) LOCK FLUSH 100 UNIT/ML IV SOLN
INTRAVENOUS | Status: AC
  Filled 2012-02-26: qty 5

## 2012-02-26 MED ORDER — MAGIC MOUTHWASH W/LIDOCAINE: 10.0000 mL | Freq: Four times a day (QID) | ORAL | Status: DC | PRN

## 2012-02-26 MED ORDER — SODIUM CHLORIDE 0.9 % IV SOLN
Freq: Once | INTRAVENOUS | Status: AC
  Administered 2012-02-26: 16 mg via INTRAVENOUS
  Filled 2012-02-26: qty 8

## 2012-02-26 MED ORDER — SODIUM CHLORIDE 0.9 % IV SOLN: 16.0000 mg | Freq: Once | INTRAVENOUS | Status: DC

## 2012-02-26 MED ORDER — OMEPRAZOLE 20 MG PO CPDR: 20.0000 mg | DELAYED_RELEASE_CAPSULE | Freq: Every day | ORAL | Status: DC

## 2012-02-26 MED ORDER — SODIUM CHLORIDE 0.9 % IV SOLN
40.0000 mg/m2 | Freq: Once | INTRAVENOUS | Status: AC
  Administered 2012-02-26: 75 mg via INTRAVENOUS
  Filled 2012-02-26: qty 75

## 2012-02-26 MED ORDER — DEXAMETHASONE SODIUM PHOSPHATE 4 MG/ML IJ SOLN: 20.0000 mg | Freq: Once | INTRAMUSCULAR | Status: DC

## 2012-02-26 NOTE — Progress Notes (Signed)
Patient reports belching frequently which causes him to vomit.  He used to take Prilosec in the past (one tablet).  I will e-scribe this proton-pump inhibitor.  His oropharynx exam is impressive for continued thrush.  He is using the [employer].  He will continue with this and I will escribe Diflucan as well.   He asks for numbing mouthwash (namely viscous lidocaine).  I will escribe this medication as well.  He was informed that it only last for 5 minutes so he is to swish and swallow immediately prior to food consumption.   KEFALAS,THOMAS

## 2012-02-27 ENCOUNTER — Ambulatory Visit
Admission: RE | Admit: 2012-02-27 | Discharge: 2012-02-27 | Disposition: A | Discharge status: Home / Self Care | Payer: Federal, State, Local not specified - PPO | Source: Ambulatory Visit | Attending: Radiation Oncology | Admitting: Radiation Oncology

## 2012-02-27 ENCOUNTER — Encounter (HOSPITAL_COMMUNITY): Payer: Self-pay | Admitting: *Deleted

## 2012-02-27 DIAGNOSIS — C099 Malignant neoplasm of tonsil, unspecified: Secondary | ICD-10-CM | POA: Insufficient documentation

## 2012-02-27 NOTE — Progress Notes (Signed)
Reviewing obtained consent list.

## 2012-02-28 ENCOUNTER — Ambulatory Visit
Admission: RE | Admit: 2012-02-28 | Discharge: 2012-02-28 | Disposition: A | Discharge status: Home / Self Care | Payer: Federal, State, Local not specified - PPO | Source: Ambulatory Visit | Attending: Radiation Oncology | Admitting: Radiation Oncology

## 2012-02-29 ENCOUNTER — Ambulatory Visit
Admission: RE | Admit: 2012-02-29 | Discharge: 2012-02-29 | Disposition: A | Discharge status: Home / Self Care | Payer: Federal, State, Local not specified - PPO | Source: Ambulatory Visit | Attending: Radiation Oncology | Admitting: Radiation Oncology

## 2012-02-29 ENCOUNTER — Encounter: Payer: Self-pay | Admitting: Radiation Oncology

## 2012-02-29 VITALS — BP 141/82 | HR 68 | Temp 97.8°F | Resp 20 | Wt 159.6 lb

## 2012-02-29 DIAGNOSIS — C099 Malignant neoplasm of tonsil, unspecified: Secondary | ICD-10-CM

## 2012-02-29 NOTE — Progress Notes (Signed)
Pt states MMW helpful w/slightly sore throat. He is eating 3 meals/day, regular foods. Taking in 1 can Ensure nightly thru peg tube. Denies nausea, fatigue, loss of appetite. States Prilosec helpful for reflux, no longer has hiccups. Applying Radiaplex to neck for hyperpigmentation.

## 2012-02-29 NOTE — Progress Notes (Signed)
Weekly Management Note Current Dose:38 Gy  Projected Dose:70 Gy   Narrative:  The patient presents for routine under treatment assessment.  CBCT/MVCT images/Port film x-rays were reviewed.  The chart was checked. Doing well. No nausea. No pain medications. Taking lidocaine and that's about it.  Eating mostly by mouth but using ensure per tube at night.   Physical Findings:  mucocitis in right posterior oropharynx. Skin is red but intact.   Vitals:  Filed Vitals:   02/29/12 0828  BP: 141/82  Pulse: 68  Temp: 97.8 F (36.6 C)  Resp: 20   Weight:  Wt Readings from Last 3 Encounters:  02/29/12 159 lb 9.6 oz (72.394 kg)  02/26/12 158 lb 3.2 oz (71.759 kg)  02/21/12 161 lb (73.029 kg)   Lab Results  Component Value Date   WBC 6.6 02/26/2012   HGB 9.3* 02/26/2012   HCT 26.8* 02/26/2012   MCV 94.0 02/26/2012   PLT 152 02/26/2012   Lab Results  Component Value Date   CREATININE 1.18 02/26/2012   BUN 22 02/26/2012   NA 128* 02/26/2012   K 3.9 02/26/2012   CL 90* 02/26/2012   CO2 32 02/26/2012     Impression:  The patient is tolerating radiation.  Plan:  Continue treatment as planned. Pt has script for pain meds if needed. He requested a letter so that he did not have to wear his seatbelt all the time due to it pressing on his Gtube which I wrote for him.

## 2012-03-01 ENCOUNTER — Ambulatory Visit
Admission: RE | Admit: 2012-03-01 | Discharge: 2012-03-01 | Disposition: A | Discharge status: Home / Self Care | Payer: Federal, State, Local not specified - PPO | Source: Ambulatory Visit | Attending: Radiation Oncology | Admitting: Radiation Oncology

## 2012-03-04 ENCOUNTER — Encounter (HOSPITAL_BASED_OUTPATIENT_CLINIC_OR_DEPARTMENT_OTHER): Payer: Federal, State, Local not specified - PPO

## 2012-03-04 ENCOUNTER — Ambulatory Visit
Admission: RE | Admit: 2012-03-04 | Discharge: 2012-03-04 | Disposition: A | Discharge status: Home / Self Care | Payer: Federal, State, Local not specified - PPO | Source: Ambulatory Visit | Attending: Radiation Oncology | Admitting: Radiation Oncology

## 2012-03-04 VITALS — BP 132/75 | HR 80 | Temp 98.8°F | Wt 154.8 lb

## 2012-03-04 DIAGNOSIS — Z5111 Encounter for antineoplastic chemotherapy: Secondary | ICD-10-CM

## 2012-03-04 DIAGNOSIS — B977 Papillomavirus as the cause of diseases classified elsewhere: Secondary | ICD-10-CM

## 2012-03-04 DIAGNOSIS — C099 Malignant neoplasm of tonsil, unspecified: Secondary | ICD-10-CM

## 2012-03-04 DIAGNOSIS — D649 Anemia, unspecified: Secondary | ICD-10-CM

## 2012-03-04 LAB — DIFFERENTIAL
Basophils Absolute: 0 K/uL (ref 0.0–0.1)
Basophils Relative: 0 % (ref 0–1)
Eosinophils Absolute: 0 K/uL (ref 0.0–0.7)
Eosinophils Relative: 0 % (ref 0–5)
Lymphocytes Relative: 7 % — ABNORMAL LOW (ref 12–46)
Lymphs Abs: 0.2 K/uL — ABNORMAL LOW (ref 0.7–4.0)
Monocytes Absolute: 0.2 K/uL (ref 0.1–1.0)
Monocytes Relative: 5 % (ref 3–12)
Neutro Abs: 3 K/uL (ref 1.7–7.7)
Neutrophils Relative %: 88 % — ABNORMAL HIGH (ref 43–77)

## 2012-03-04 LAB — CBC
HCT: 23 % — ABNORMAL LOW (ref 39.0–52.0)
Hemoglobin: 7.7 g/dL — ABNORMAL LOW (ref 13.0–17.0)
MCH: 32 pg (ref 26.0–34.0)
MCHC: 33.5 g/dL (ref 30.0–36.0)
MCV: 95.4 fL (ref 78.0–100.0)
Platelets: 151 K/uL (ref 150–400)
RBC: 2.41 MIL/uL — ABNORMAL LOW (ref 4.22–5.81)
RDW: 15.2 % (ref 11.5–15.5)
WBC: 3.5 K/uL — ABNORMAL LOW (ref 4.0–10.5)

## 2012-03-04 LAB — COMPREHENSIVE METABOLIC PANEL WITH GFR
ALT: 8 U/L (ref 0–53)
AST: 9 U/L (ref 0–37)
Albumin: 2.9 g/dL — ABNORMAL LOW (ref 3.5–5.2)
Alkaline Phosphatase: 56 U/L (ref 39–117)
BUN: 18 mg/dL (ref 6–23)
CO2: 30 meq/L (ref 19–32)
Calcium: 8.4 mg/dL (ref 8.4–10.5)
Chloride: 97 meq/L (ref 96–112)
Creatinine, Ser: 1.27 mg/dL (ref 0.50–1.35)
GFR calc Af Amer: 63 mL/min — ABNORMAL LOW
GFR calc non Af Amer: 54 mL/min — ABNORMAL LOW
Glucose, Bld: 116 mg/dL — ABNORMAL HIGH (ref 70–99)
Potassium: 4.3 meq/L (ref 3.5–5.1)
Sodium: 133 meq/L — ABNORMAL LOW (ref 135–145)
Total Bilirubin: 0.3 mg/dL (ref 0.3–1.2)
Total Protein: 6.2 g/dL (ref 6.0–8.3)

## 2012-03-04 LAB — PREPARE RBC (CROSSMATCH)

## 2012-03-04 MED ORDER — SODIUM CHLORIDE 0.9 % IJ SOLN
INTRAMUSCULAR | Status: AC
  Filled 2012-03-04: qty 10

## 2012-03-04 MED ORDER — SODIUM CHLORIDE 0.9 % IV SOLN: 16.0000 mg | Freq: Once | INTRAVENOUS | Status: DC

## 2012-03-04 MED ORDER — SODIUM CHLORIDE 0.9 % IV SOLN
Freq: Once | INTRAVENOUS | Status: AC
  Administered 2012-03-04: 1500 mL via INTRAVENOUS

## 2012-03-04 MED ORDER — SODIUM CHLORIDE 0.9 % IJ SOLN
10.0000 mL | INTRAMUSCULAR | Status: DC | PRN
  Administered 2012-03-04: 10 mL
  Filled 2012-03-04: qty 10

## 2012-03-04 MED ORDER — DEXAMETHASONE SODIUM PHOSPHATE 4 MG/ML IJ SOLN: 20.0000 mg | Freq: Once | INTRAMUSCULAR | Status: DC

## 2012-03-04 MED ORDER — SODIUM CHLORIDE 0.9 % IV SOLN
40.0000 mg/m2 | Freq: Once | INTRAVENOUS | Status: AC
  Administered 2012-03-04: 75 mg via INTRAVENOUS
  Filled 2012-03-04: qty 75

## 2012-03-04 MED ORDER — SODIUM CHLORIDE 0.9 % IV SOLN
Freq: Once | INTRAVENOUS | Status: AC
  Administered 2012-03-04: 16 mg via INTRAVENOUS
  Filled 2012-03-04: qty 8

## 2012-03-04 MED ORDER — HEPARIN SOD (PORK) LOCK FLUSH 100 UNIT/ML IV SOLN
500.0000 [IU] | Freq: Once | INTRAVENOUS | Status: AC | PRN
  Administered 2012-03-04: 500 [IU]
  Filled 2012-03-04: qty 5

## 2012-03-04 MED ORDER — HEPARIN SOD (PORK) LOCK FLUSH 100 UNIT/ML IV SOLN
INTRAVENOUS | Status: AC
  Filled 2012-03-04: qty 5

## 2012-03-04 NOTE — Progress Notes (Signed)
Patient here for weekly under treat visit for head/nck treatment of tonsillar cancer.Patient has mild sore throat.Sceduled for chemotherapy today in [place].Weight loss of 5 lbs since 02/29/12.Patient states he has felt better than he has in a long time and attributes weight loss to working out this Weekend.Skin hyperpigmented but intact without peeling.

## 2012-03-04 NOTE — Progress Notes (Signed)
Tolerated chemo well. 

## 2012-03-04 NOTE — Addendum Note (Signed)
Addended by: Edythe Lynn A on: 03/04/2012 03:59 PM   Modules accepted: Orders

## 2012-03-04 NOTE — Progress Notes (Signed)
   Department of Radiation Oncology  Phone:  [phone] Fax:        [phone]    Weekly Management Note Current Dose:42.0 Gy  Projected Dose:70.0 Gy   Narrative:  The patient presents for routine under treatment assessment.  CBCT/MVCT images/Port film x-rays were reviewed.  The chart was checked.  He does have sore throat but with viscous lidocaine he is able to swallow solid foods.  He wishes to stay away from narcotic pain medications at this time.  Physical Findings:  The lungs are clear. The heart has a regular rhythm and rate. The skin of the treatment area shows some hyperpigmentation changes and mild erythema.  The oral cavity shows the mild white coating to the dorsum of the tongue. I should mention the patient is on Diflucan. Patient has confluent mucositis along the soft palate and pharyngeal area. There is no hemorrhagic mucositis. Vitals:  Filed Vitals:   03/04/12 0830  BP: 132/75  Pulse: 80  Temp: 98.8 F (37.1 C)   Weight:  Wt Readings from Last 3 Encounters:  03/04/12 154 lb 12.8 oz (70.217 kg)  02/29/12 159 lb 9.6 oz (72.394 kg)  02/26/12 158 lb 3.2 oz (71.759 kg)   Lab Results  Component Value Date   WBC 6.6 02/26/2012   HGB 9.3* 02/26/2012   HCT 26.8* 02/26/2012   MCV 94.0 02/26/2012   PLT 152 02/26/2012   Lab Results  Component Value Date   CREATININE 1.18 02/26/2012   BUN 22 02/26/2012   NA 128* 02/26/2012   K 3.9 02/26/2012   CL 90* 02/26/2012   CO2 32 02/26/2012     Impression:  The patient is tolerating radiation. I have encouraged him to increase his oral intake in light of his weight loss. He will proceed with additional chemotherapy today.  Plan:  Continue treatment as planned.  -----------------------------------  Billie Lade, PhD, MD

## 2012-03-04 NOTE — Addendum Note (Signed)
Addended by: Ellouise Newer III on: 03/04/2012 03:49 PM   Modules accepted: Orders, SmartSet

## 2012-03-05 ENCOUNTER — Ambulatory Visit
Admission: RE | Admit: 2012-03-05 | Discharge: 2012-03-05 | Disposition: A | Discharge status: Home / Self Care | Payer: Federal, State, Local not specified - PPO | Source: Ambulatory Visit | Attending: Radiation Oncology | Admitting: Radiation Oncology

## 2012-03-05 ENCOUNTER — Encounter (HOSPITAL_BASED_OUTPATIENT_CLINIC_OR_DEPARTMENT_OTHER): Payer: Federal, State, Local not specified - PPO

## 2012-03-05 DIAGNOSIS — D649 Anemia, unspecified: Secondary | ICD-10-CM

## 2012-03-05 MED ORDER — SODIUM CHLORIDE 0.9 % IV SOLN
250.0000 mL | Freq: Once | INTRAVENOUS | Status: AC
  Administered 2012-03-05: 12:00:00 via INTRAVENOUS

## 2012-03-05 MED ORDER — HEPARIN SOD (PORK) LOCK FLUSH 100 UNIT/ML IV SOLN
INTRAVENOUS | Status: AC
  Filled 2012-03-05: qty 5

## 2012-03-05 MED ORDER — HEPARIN SOD (PORK) LOCK FLUSH 100 UNIT/ML IV SOLN
500.0000 [IU] | Freq: Every day | INTRAVENOUS | Status: AC | PRN
  Administered 2012-03-05: 500 [IU]
  Filled 2012-03-05: qty 5

## 2012-03-05 MED ORDER — SODIUM CHLORIDE 0.9 % IJ SOLN
10.0000 mL | INTRAMUSCULAR | Status: AC | PRN
  Administered 2012-03-05: 10 mL
  Filled 2012-03-05: qty 10

## 2012-03-05 NOTE — Progress Notes (Signed)
Tolerated prbc transfusion well. 

## 2012-03-06 ENCOUNTER — Ambulatory Visit
Admission: RE | Admit: 2012-03-06 | Discharge: 2012-03-06 | Disposition: A | Discharge status: Home / Self Care | Payer: Federal, State, Local not specified - PPO | Source: Ambulatory Visit | Attending: Radiation Oncology | Admitting: Radiation Oncology

## 2012-03-06 LAB — TYPE AND SCREEN
ABO/RH(D): A NEG
Antibody Screen: NEGATIVE

## 2012-03-07 ENCOUNTER — Ambulatory Visit
Admission: RE | Admit: 2012-03-07 | Discharge: 2012-03-07 | Disposition: A | Discharge status: Home / Self Care | Payer: Federal, State, Local not specified - PPO | Source: Ambulatory Visit | Attending: Radiation Oncology | Admitting: Radiation Oncology

## 2012-03-08 ENCOUNTER — Ambulatory Visit
Admission: RE | Admit: 2012-03-08 | Discharge: 2012-03-08 | Disposition: A | Discharge status: Home / Self Care | Payer: Federal, State, Local not specified - PPO | Source: Ambulatory Visit | Attending: Radiation Oncology | Admitting: Radiation Oncology

## 2012-03-11 ENCOUNTER — Encounter (HOSPITAL_BASED_OUTPATIENT_CLINIC_OR_DEPARTMENT_OTHER): Payer: Federal, State, Local not specified - PPO | Admitting: Oncology

## 2012-03-11 ENCOUNTER — Ambulatory Visit
Admission: RE | Admit: 2012-03-11 | Discharge: 2012-03-11 | Disposition: A | Discharge status: Home / Self Care | Payer: Federal, State, Local not specified - PPO | Source: Ambulatory Visit | Attending: Radiation Oncology | Admitting: Radiation Oncology

## 2012-03-11 ENCOUNTER — Encounter (HOSPITAL_BASED_OUTPATIENT_CLINIC_OR_DEPARTMENT_OTHER): Payer: Federal, State, Local not specified - PPO

## 2012-03-11 VITALS — BP 121/74 | HR 80 | Temp 99.5°F | Resp 20

## 2012-03-11 DIAGNOSIS — B370 Candidal stomatitis: Secondary | ICD-10-CM

## 2012-03-11 DIAGNOSIS — B977 Papillomavirus as the cause of diseases classified elsewhere: Secondary | ICD-10-CM

## 2012-03-11 DIAGNOSIS — Z5111 Encounter for antineoplastic chemotherapy: Secondary | ICD-10-CM

## 2012-03-11 DIAGNOSIS — C099 Malignant neoplasm of tonsil, unspecified: Secondary | ICD-10-CM

## 2012-03-11 DIAGNOSIS — R599 Enlarged lymph nodes, unspecified: Secondary | ICD-10-CM

## 2012-03-11 LAB — BASIC METABOLIC PANEL WITH GFR
BUN: 23 mg/dL (ref 6–23)
CO2: 32 meq/L (ref 19–32)
Calcium: 8.7 mg/dL (ref 8.4–10.5)
Chloride: 96 meq/L (ref 96–112)
Creatinine, Ser: 1.38 mg/dL — ABNORMAL HIGH (ref 0.50–1.35)
GFR calc Af Amer: 57 mL/min — ABNORMAL LOW
GFR calc non Af Amer: 49 mL/min — ABNORMAL LOW
Glucose, Bld: 130 mg/dL — ABNORMAL HIGH (ref 70–99)
Potassium: 4.3 meq/L (ref 3.5–5.1)
Sodium: 136 meq/L (ref 135–145)

## 2012-03-11 LAB — CBC
HCT: 28.8 % — ABNORMAL LOW (ref 39.0–52.0)
Hemoglobin: 9.8 g/dL — ABNORMAL LOW (ref 13.0–17.0)
MCH: 32.1 pg (ref 26.0–34.0)
MCHC: 34 g/dL (ref 30.0–36.0)
MCV: 94.4 fL (ref 78.0–100.0)
Platelets: 201 K/uL (ref 150–400)
RBC: 3.05 MIL/uL — ABNORMAL LOW (ref 4.22–5.81)
RDW: 15.2 % (ref 11.5–15.5)
WBC: 5.2 K/uL (ref 4.0–10.5)

## 2012-03-11 LAB — DIFFERENTIAL
Basophils Absolute: 0 K/uL (ref 0.0–0.1)
Basophils Relative: 0 % (ref 0–1)
Eosinophils Absolute: 0 K/uL (ref 0.0–0.7)
Eosinophils Relative: 0 % (ref 0–5)
Lymphocytes Relative: 4 % — ABNORMAL LOW (ref 12–46)
Lymphs Abs: 0.2 K/uL — ABNORMAL LOW (ref 0.7–4.0)
Monocytes Absolute: 0.5 K/uL (ref 0.1–1.0)
Monocytes Relative: 10 % (ref 3–12)
Neutro Abs: 4.4 K/uL (ref 1.7–7.7)
Neutrophils Relative %: 86 % — ABNORMAL HIGH (ref 43–77)
WBC Morphology: INCREASED

## 2012-03-11 MED ORDER — SODIUM CHLORIDE 0.9 % IJ SOLN
10.0000 mL | INTRAMUSCULAR | Status: DC | PRN
  Filled 2012-03-11: qty 10

## 2012-03-11 MED ORDER — HYDROCODONE-ACETAMINOPHEN 7.5-500 MG/15ML PO SOLN: 15.0000 mL | ORAL | Status: AC | PRN

## 2012-03-11 MED ORDER — SODIUM CHLORIDE 0.9 % IV SOLN
Freq: Once | INTRAVENOUS | Status: AC
  Administered 2012-03-11: 16 mg via INTRAVENOUS
  Filled 2012-03-11: qty 8

## 2012-03-11 MED ORDER — SODIUM CHLORIDE 0.9 % IV SOLN: 16.0000 mg | Freq: Once | INTRAVENOUS | Status: DC

## 2012-03-11 MED ORDER — SODIUM CHLORIDE 0.9 % IV SOLN
40.0000 mg/m2 | Freq: Once | INTRAVENOUS | Status: AC
  Administered 2012-03-11: 75 mg via INTRAVENOUS
  Filled 2012-03-11: qty 75

## 2012-03-11 MED ORDER — DEXAMETHASONE SODIUM PHOSPHATE 4 MG/ML IJ SOLN: 20.0000 mg | Freq: Once | INTRAMUSCULAR | Status: DC

## 2012-03-11 MED ORDER — SODIUM CHLORIDE 0.9 % IV SOLN
Freq: Once | INTRAVENOUS | Status: AC
  Administered 2012-03-11: 10:00:00 via INTRAVENOUS

## 2012-03-11 MED ORDER — HEPARIN SOD (PORK) LOCK FLUSH 100 UNIT/ML IV SOLN
500.0000 [IU] | Freq: Once | INTRAVENOUS | Status: AC | PRN
  Administered 2012-03-11: 500 [IU]
  Filled 2012-03-11: qty 5

## 2012-03-11 NOTE — Patient Instructions (Signed)
[facility]  Discharge Instructions  Austin Mathis  DOB [birth year] CSN [id number]  MRN [id number] Dr. Glenford Peers   RECOMMENDATIONS MADE BY THE CONSULTANT AND ANY TEST RESULTS WILL BE SENT TO YOUR REFERRING DOCTOR.   EXAM FINDINGS BY MD TODAY AND SIGNS AND SYMPTOMS TO REPORT TO CLINIC OR PRIMARY MD:  Return in 4 weeks  I acknowledge that I have been informed and understand all the instructions given to me and received a copy. I do not have any more questions at this time, but understand that I may call the Specialty Clinic at [facility] at [phone] during business hours should I have any further questions or need assistance in obtaining follow-up care.    __________________________________________  _____________  __________ Signature of Patient or Authorized Representative            Date                   Time    __________________________________________ Nurse's Signature

## 2012-03-11 NOTE — Progress Notes (Signed)
Infusion complete, patient to see the PA before leaving.

## 2012-03-11 NOTE — Progress Notes (Signed)
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  S/P 6 cycles of chemotherapy.   INTERVAL HISTORY: Austin Mathis 71 y.o. male returns for  regular  visit for followup of  bilateral neck adenopathy from a primary in the tonsil on the best 4.3 x 2.7 cm in size. His biopsy showed squamous cell carcinoma consistent with a tonsillar primary with bilateral cervical adenopathy and the PET scan showed no distant disease. 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   Thus far, he is tolerating therapy well. 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  He asks for liquid pain medication.  I will give him liquid lortab (hydrocodone 7.5- 500mg ).  Otherwise, he continues to take food PO as tolerated.  He has a 5 lb weight loss in 1 week and we will continue to monitor.  He admits that he continues to have an appetite. Therefore, no appetite stimulant provided today.  He has this week and next week worth of radiation and one more cycle of Cisplatin next Monday.  That will complete his 7 cycles.    He does have increased secretions which I suspect is from radiation.  He thought he would have dry mouth and not "wet mouth."  He asks if this therapy will treat his HPV + cancer and I explained that it will not treat his HPV.  I provided him education regarding HPV + cancer.   Otherwise, he denies any complaints.  He reports, "physically I am doing well with therapy."  Past Medical History  Diagnosis Date  . High cholesterol   . Hearing deficit     Right greater than left.  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 Hypertension     EKG 5/13, chest CT 4/13 [facility], clearance with OV 01/08/12 oncology [facility]    . GERD (gastroesophageal reflux disease)   . Tonsillar cancer     last chemo 01/01/12  . Primary squamous cell carcinoma of tonsil 11/16/2011    has Primary squamous cell carcinoma of tonsil; Hypertension; Hypercholesterolemia; Herpetic lesions of face; and Encounter for PEG (percutaneous endoscopic gastrostomy) on his problem list.      has no known allergies.  Austin Mathis does not currently have medications on file.  Past Surgical History  Procedure Date  . Portacath placement 11/24/2011    left side  . Multiple extractions with alveoloplasty 01/10/2012    Procedure: MULTIPLE EXTRACION WITH ALVEOLOPLASTY;  Surgeon: Charlynne Pander, DDS;  Location: [facility];  Service: Oral Surgery;  Laterality: N/A;  Extraction of tooth #'s 1,3,4,5,6,7,8,9,10,11,12,13,14,15,16,17,18,20,21,22, 23,24,25,26,27,28,29,30 with alveoloplasty.  . Esophagogastroduodenoscopy 01/16/2012    Procedure: ESOPHAGOGASTRODUODENOSCOPY (EGD);  Surgeon: West Bali, MD;  Location: [facility];  Service: Endoscopy;  Laterality: N/A;  9:30  . Peg placement 01/16/2012    Procedure: PERCUTANEOUS ENDOSCOPIC GASTROSTOMY (PEG) PLACEMENT;  Surgeon: West Bali, MD;  Location: [facility];  Service: Endoscopy;  Laterality: N/A;  Ancef one gram IV before procedure    Denies any headaches, dizziness, double vision, fevers, chills, night sweats, nausea, vomiting, diarrhea, constipation, chest pain, heart palpitations, shortness of breath, blood in stool, black tarry stool, urinary pain, urinary burning, urinary frequency, hematuria.   PHYSICAL EXAMINATION  ECOG PERFORMANCE STATUS: 1 - Symptomatic but  completely ambulatory  There were no vitals filed for this visit.  GENERAL:alert, well nourished, well developed, comfortable, cooperative and smiling SKIN: skin color, texture, turgor are normal, no rashes or significant lesions HEAD: Normocephalic, No masses, lesions, tenderness or abnormalities EYES: normal, Conjunctiva are  pink and non-injected EARS: External ears normal OROPHARYNX:lips, buccal mucosa, and tongue normal and mucous membranes are moist  NECK: supple, no adenopathy, no stridor, non-tender, trachea midline LYMPH:  no palpable lymphadenopathy BREAST:not examined LUNGS: clear to auscultation  HEART: regular rate & rhythm, no murmurs, no gallops, S1 normal and S2 normal ABDOMEN:abdomen soft, non-tender and normal bowel sounds BACK: Back symmetric, no curvature. EXTREMITIES:less then 2 second capillary refill, no joint deformities, effusion, or inflammation, no edema, no skin discoloration, no clubbing, no cyanosis  NEURO: alert & oriented x 3 with fluent speech, no focal motor/sensory deficits, gait normal   LABORATORY DATA: CBC    Component Value Date/Time   WBC 5.2 03/11/2012 0929   RBC 3.05* 03/11/2012 0929   HGB 9.8* 03/11/2012 0929   HCT 28.8* 03/11/2012 0929   PLT 201 03/11/2012 0929   MCV 94.4 03/11/2012 0929   MCH 32.1 03/11/2012 0929   MCHC 34.0 03/11/2012 0929   RDW 15.2 03/11/2012 0929   LYMPHSABS 0.2* 03/11/2012 0929   MONOABS 0.5 03/11/2012 0929   EOSABS 0.0 03/11/2012 0929   BASOSABS 0.0 03/11/2012 0929      Chemistry      Component Value Date/Time   NA 136 03/11/2012 0929   K 4.3 03/11/2012 0929   CL 96 03/11/2012 0929   CO2 32 03/11/2012 0929   BUN 23 03/11/2012 0929   CREATININE 1.38* 03/11/2012 0929      Component Value Date/Time   CALCIUM 8.7 03/11/2012 0929   ALKPHOS 56 03/04/2012 0945   AST 9 03/04/2012 0945   ALT 8 03/04/2012 0945   BILITOT 0.3 03/04/2012 0945      PATHOLOGY: Right tonsillar mass, invasive squamous cell carcinoma, HPV+, strong p16 immunohistochemical staining.    ASSESSMENT:  1. Bilateral neck adenopathy from a primary in the tonsil on the best 4.3 x 2.7 cm in size. His biopsy showed squamous cell carcinoma consistent with a tonsillar primary with bilateral cervical adenopathy and the PET scan showed no distant disease. He therefore has stage IV disease  that is HPV +.  2. Herpetic lesions of the lips, resolved. On prophylactic acyclovir.  3. Oral thrush, improved 4. Radiation esophagitis   PLAN:  1. I personally reviewed and went over laboratory results with the patient. 2. Rx for Liquid Lortab (7.5-500) #300 mL with 1 refill.   3. Return on Monday for cycle 7 of Cisplatin and continue with radiation as directed.  4. Return in 4 weeks for follow-up.  We can certainly see him sooner if needed.    All questions were answered. The patient knows to call the clinic with any problems, questions or concerns. We can certainly see the patient much sooner if necessary.  The patient and plan discussed with Glenford Peers, MD and he is in agreement with the aforementioned.  Carleta Woodrow

## 2012-03-12 ENCOUNTER — Ambulatory Visit
Admission: RE | Admit: 2012-03-12 | Discharge: 2012-03-12 | Disposition: A | Discharge status: Home / Self Care | Payer: Federal, State, Local not specified - PPO | Source: Ambulatory Visit | Attending: Radiation Oncology | Admitting: Radiation Oncology

## 2012-03-12 ENCOUNTER — Encounter: Payer: Self-pay | Admitting: Radiation Oncology

## 2012-03-12 VITALS — BP 152/89 | HR 80 | Temp 97.8°F | Resp 20 | Wt 158.1 lb

## 2012-03-12 DIAGNOSIS — C099 Malignant neoplasm of tonsil, unspecified: Secondary | ICD-10-CM

## 2012-03-12 NOTE — Progress Notes (Signed)
Weekly Management Note Current Dose:54 Gy  Projected Dose:70 Gy   Narrative:  The patient presents for routine under treatment assessment.  CBCT/MVCT images/Port film x-rays were reviewed.  The chart was checked.  Lortab bid is controlling pain. Slight fatigue.  Physical Findings:  Mucocitis in posterior oropharynx. Neck is pink but no pain.   Vitals:  Filed Vitals:   03/12/12 0836  BP: 152/89  Pulse: 80  Temp: 97.8 F (36.6 C)  Resp: 20   Weight:  Wt Readings from Last 3 Encounters:  03/12/12 158 lb 1.6 oz (71.714 kg)  03/04/12 154 lb 12.8 oz (70.217 kg)  02/29/12 159 lb 9.6 oz (72.394 kg)   Lab Results  Component Value Date   WBC 5.2 03/11/2012   HGB 9.8* 03/11/2012   HCT 28.8* 03/11/2012   MCV 94.4 03/11/2012   PLT 201 03/11/2012   Lab Results  Component Value Date   CREATININE 1.38* 03/11/2012   BUN 23 03/11/2012   NA 136 03/11/2012   K 4.3 03/11/2012   CL 96 03/11/2012   CO2 32 03/11/2012     Impression:  The patient is tolerating radiation.  Plan:  Continue treatment as planned. Continue Lortab and may increase amount. Weight looks great. Encouraged to call with questions.

## 2012-03-12 NOTE — Progress Notes (Signed)
Patient, alert,oriented x3, rad tx 27/35 completed tonsil/neck, neck erythema, skin intact, hoarseness, since he started using lortab elixir,in am and 5pm he is able to eat his meals, no taste still, secretions thick uses salt water rinses, had cycle #7 cisplatin yesterday, , eats banana smoothies and ice cream, for extra calories, eats fish ,chicken, soups, brakfast eats poached eggs, crumbled bacon bits or sausage,, coffee , energy level, slight fatigue, likes to stay busy"I pace myself" no pain at present 8:36 AM

## 2012-03-13 ENCOUNTER — Ambulatory Visit
Admission: RE | Admit: 2012-03-13 | Discharge: 2012-03-13 | Disposition: A | Discharge status: Home / Self Care | Payer: Federal, State, Local not specified - PPO | Source: Ambulatory Visit | Attending: Radiation Oncology | Admitting: Radiation Oncology

## 2012-03-14 ENCOUNTER — Ambulatory Visit
Admission: RE | Admit: 2012-03-14 | Discharge: 2012-03-14 | Disposition: A | Discharge status: Home / Self Care | Payer: Federal, State, Local not specified - PPO | Source: Ambulatory Visit | Attending: Radiation Oncology | Admitting: Radiation Oncology

## 2012-03-15 ENCOUNTER — Ambulatory Visit
Admission: RE | Admit: 2012-03-15 | Discharge: 2012-03-15 | Disposition: A | Discharge status: Home / Self Care | Payer: Federal, State, Local not specified - PPO | Source: Ambulatory Visit | Attending: Radiation Oncology | Admitting: Radiation Oncology

## 2012-03-18 ENCOUNTER — Encounter: Payer: Self-pay | Admitting: Radiation Oncology

## 2012-03-18 ENCOUNTER — Encounter (HOSPITAL_BASED_OUTPATIENT_CLINIC_OR_DEPARTMENT_OTHER): Payer: Federal, State, Local not specified - PPO

## 2012-03-18 ENCOUNTER — Ambulatory Visit
Admission: RE | Admit: 2012-03-18 | Discharge: 2012-03-18 | Disposition: A | Discharge status: Home / Self Care | Payer: Federal, State, Local not specified - PPO | Source: Ambulatory Visit | Attending: Radiation Oncology | Admitting: Radiation Oncology

## 2012-03-18 VITALS — BP 135/71 | HR 82 | Temp 98.4°F | Resp 20 | Ht 67.0 in | Wt 148.0 lb

## 2012-03-18 VITALS — BP 134/77 | HR 78 | Temp 98.6°F | Resp 18 | Wt 149.9 lb

## 2012-03-18 DIAGNOSIS — Z5111 Encounter for antineoplastic chemotherapy: Secondary | ICD-10-CM

## 2012-03-18 DIAGNOSIS — C099 Malignant neoplasm of tonsil, unspecified: Secondary | ICD-10-CM

## 2012-03-18 LAB — COMPREHENSIVE METABOLIC PANEL
AST: 13 U/L (ref 0–37)
Albumin: 2.5 g/dL — ABNORMAL LOW (ref 3.5–5.2)
BUN: 26 mg/dL — ABNORMAL HIGH (ref 6–23)
Calcium: 9 mg/dL (ref 8.4–10.5)
Creatinine, Ser: 1.37 mg/dL — ABNORMAL HIGH (ref 0.50–1.35)
Total Protein: 6.2 g/dL (ref 6.0–8.3)

## 2012-03-18 LAB — CBC WITH DIFFERENTIAL/PLATELET
Basophils Relative: 0 % (ref 0–1)
Eosinophils Absolute: 0 10*3/uL (ref 0.0–0.7)
Eosinophils Relative: 0 % (ref 0–5)
Hemoglobin: 9.5 g/dL — ABNORMAL LOW (ref 13.0–17.0)
Lymphocytes Relative: 2 % — ABNORMAL LOW (ref 12–46)
MCHC: 34.2 g/dL (ref 30.0–36.0)
Monocytes Relative: 9 % (ref 3–12)
Neutrophils Relative %: 89 % — ABNORMAL HIGH (ref 43–77)
RBC: 2.92 MIL/uL — ABNORMAL LOW (ref 4.22–5.81)
WBC: 7 10*3/uL (ref 4.0–10.5)

## 2012-03-18 MED ORDER — SODIUM CHLORIDE 0.9 % IV SOLN
Freq: Once | INTRAVENOUS | Status: AC
  Administered 2012-03-18: 11:00:00 via INTRAVENOUS

## 2012-03-18 MED ORDER — BIAFINE EX EMUL
Freq: Every day | CUTANEOUS | Status: DC
  Administered 2012-03-18: 09:00:00 via TOPICAL

## 2012-03-18 MED ORDER — HEPARIN SOD (PORK) LOCK FLUSH 100 UNIT/ML IV SOLN
500.0000 [IU] | Freq: Once | INTRAVENOUS | Status: AC | PRN
  Administered 2012-03-18: 500 [IU]
  Filled 2012-03-18: qty 5

## 2012-03-18 MED ORDER — DEXAMETHASONE SODIUM PHOSPHATE 4 MG/ML IJ SOLN: 20.0000 mg | Freq: Once | INTRAMUSCULAR | Status: DC

## 2012-03-18 MED ORDER — HEPARIN SOD (PORK) LOCK FLUSH 100 UNIT/ML IV SOLN
INTRAVENOUS | Status: AC
  Filled 2012-03-18: qty 5

## 2012-03-18 MED ORDER — CISPLATIN CHEMO INJECTION 100MG/100ML
40.0000 mg/m2 | Freq: Once | INTRAVENOUS | Status: AC
  Administered 2012-03-18: 75 mg via INTRAVENOUS
  Filled 2012-03-18: qty 75

## 2012-03-18 MED ORDER — SODIUM CHLORIDE 0.9 % IJ SOLN
10.0000 mL | INTRAMUSCULAR | Status: DC | PRN
  Administered 2012-03-18: 10 mL
  Filled 2012-03-18: qty 10

## 2012-03-18 MED ORDER — ONDANSETRON HCL 40 MG/20ML IJ SOLN
Freq: Once | INTRAMUSCULAR | Status: AC
  Administered 2012-03-18: 16 mg via INTRAVENOUS
  Filled 2012-03-18: qty 8

## 2012-03-18 MED ORDER — SODIUM CHLORIDE 0.9 % IV SOLN: 16.0000 mg | Freq: Once | INTRAVENOUS | Status: DC

## 2012-03-18 NOTE — Progress Notes (Signed)
Patient presents to the clinic today for PUT with Dr. Roselind Messier. Patient is alert and oriented to person, place, and time. No distress noted. Steady gait noted. Pleasant affect noted. Patient denies pain at this time. Patient reports a sore throat that is more intense when he swallows. Patient reports taking Lortab elixir as directed for this sore throat. Patient reports a persistent productive cough with blood tinged sputum. Patient reports thick sticky white oral secretions. Patient reports that he supplements with one can of ensure each morning and afternoon. Patient reports that he forces himself to eat but, has not appetite. Eight pound weight loss noted since 03/12/2012. Hoarseness noted. Patient denies nausea, vomiting, headache, or dizziness.  Reported all findings to Dr. Roselind Messier.

## 2012-03-18 NOTE — Progress Notes (Signed)
Contacted Zenovia Jarred, RD reference eight pound weight loss. Appt scheduled for Wednesday at 0945. Left message on her voicemail reference patient.

## 2012-03-18 NOTE — Progress Notes (Signed)
[facility]    Radiation Oncology [address] [place]     Maryln Gottron, M.D. [place], [place] [address]               Billie Lade, M.D., Ph.D. Phone: [phone]      Molli Hazard A. Kathrynn Running, M.D. Fax: [phone]      Radene Gunning, M.D., Ph.D.         Lurline Hare, M.D.         Grayland Jack, M.D Weekly Treatment Management Note  Name: Austin Mathis     MRN: [id number]        CSN: [id number] Date: 03/18/2012      DOB: 1941-07-21  CC: [name], MD         [place]    Status: Outpatient  Diagnosis: The encounter diagnosis was Primary squamous cell carcinoma of tonsil.  Current Dose: 62.0 Gy  Current Fraction: 31  Planned Dose: 70.0 Gy  Narrative: [name] was seen today for weekly treatment management. The chart was checked and MVCT  were reviewed.  He is having some sore throat with swallowing. Patient takes Lortab elixir twice a day which controls this issue. He continues to take in soft foods. Patient consumed 3 poached eggs this morning. He takes in 2  cans Ensure daily. The patient has lost approximately 8 pounds over the past week and I recommended he increase his Ensure intake to at least 4 cans daily.  He will meet with the nutritionist for additional guidelines concerning Weight maintenance. He has  occasional blood-tinged sputum.  Review of patient's allergies indicates no known allergies. Current Outpatient Prescriptions  Medication Sig Dispense Refill  . acyclovir (ZOVIRAX) 400 MG tablet Take 400 mg by mouth 2 (two) times daily.      . Alum & Mag Hydroxide-Simeth (MAGIC MOUTHWASH W/LIDOCAINE) SOLN Take 10 mLs by mouth 4 (four) times daily as needed.  600 mL  1  . Alum & Mag Hydroxide-Simeth (MAGIC MOUTHWASH) SOLN Take 5 mLs by mouth 4 (four) times daily as needed.  300 mL  1  . fluconazole (DIFLUCAN) 100 MG tablet Take one tablet daily, PO until oral thrush resolves.  30 tablet  1  . hyaluronate sodium (RADIAPLEXRX) GEL Apply topically 2  (two) times daily.      Marland Kitchen HYDROcodone-acetaminophen (LORTAB) 7.5-500 MG/15ML solution Take 15 mLs by mouth every 4 (four) hours as needed for pain.  300 mL  1  . lidocaine-prilocaine (EMLA) cream Apply 1 application topically as needed. To access port      . lisinopril (PRINIVIL,ZESTRIL) 40 MG tablet Take 40 mg by mouth every morning.       Marland Kitchen LORazepam (ATIVAN) 1 MG tablet Take 1 mg by mouth every 4 (four) hours as needed. Nausea/vomiting      . magnesium oxide (MAG-OX) 400 (241.3 MG) MG tablet Take 400 mg by mouth as directed. Twice a day every other day      . omeprazole (PRILOSEC) 20 MG capsule Take 1 capsule (20 mg total) by mouth daily.  30 capsule  2  . ondansetron (ZOFRAN) 8 MG tablet Take by mouth. Starting the day after chemo, take 1 tablet in am and 1 tablet in pm x 2 days. Then may take 1 tablet twice a day IF needed for nausea/vomiting.      . potassium chloride 20 MEQ/15ML (10%) solution Take 20 mEq by mouth every other day.       . pravastatin (PRAVACHOL) 40  MG tablet Take 40 mg by mouth at bedtime.      . verapamil (CALAN-SR) 120 MG CR tablet Take 120 mg by mouth at bedtime.      Marland Kitchen dexamethasone (DECADRON) 4 MG tablet Take 8 mg by mouth. Starting the day after chemo, take 2 tablets = 8mg  daily x 2 days. Take with food.       Current Facility-Administered Medications  Medication Dose Route Frequency Provider Last Rate Last Dose  . topical emolient (BIAFINE) emulsion   Topical Daily Billie Lade, MD       Labs:  Lab Results  Component Value Date   WBC 5.2 03/11/2012   HGB 9.8* 03/11/2012   HCT 28.8* 03/11/2012   MCV 94.4 03/11/2012   PLT 201 03/11/2012   Lab Results  Component Value Date   CREATININE 1.38* 03/11/2012   BUN 23 03/11/2012   NA 136 03/11/2012   K 4.3 03/11/2012   CL 96 03/11/2012   CO2 32 03/11/2012   Lab Results  Component Value Date   ALT 8 03/04/2012   AST 9 03/04/2012   BILITOT 0.3 03/04/2012    Physical Examination:  weight is 149 lb 14.4 oz (67.994 kg).  His oral temperature is 98.6 F (37 C). His blood pressure is 134/77 and his pulse is 78. His respiration is 18.    Wt Readings from Last 3 Encounters:  03/18/12 149 lb 14.4 oz (67.994 kg)  03/12/12 158 lb 1.6 oz (71.714 kg)  03/04/12 154 lb 12.8 oz (70.217 kg)     Lungs - Normal respiratory effort, chest expands symmetrically. Lungs are clear to auscultation, no crackles or wheezes.  Heart has regular rhythm and rate  Abdomen is soft and non tender with normal bowel sounds The neck shows some dry desquamation and erythema as well as hyperpigmentation changes. There is no moist desquamation. the oral cavity reveals mucositis along the soft palate and pharyngeal area. Patient has a small crater-like area in the right soft palate from where his extensive tumor was located.  Assessment:  Patient tolerating treatments reasonably well with expected side effects. Patient will continue with additional chemotherapy later today.  Plan: Continue treatment per original radiation prescription  to 7000 cGy.  -----------------------------------  Billie Lade, PhD, MD

## 2012-03-18 NOTE — Progress Notes (Signed)
Labs drawn today for cbc/diff,cmp 

## 2012-03-18 NOTE — Patient Instructions (Signed)
[facility] Discharge Instructions for Patients Receiving Chemotherapy  Today you received the following chemotherapy agents  Cisplatin  This was your last dose of chemo. We need to flush your port every 6 weeks and I have made you an appt for that in October. I will check with Dr. Mariel Sleet to see if you need labwork done between now and when you see him in September.    If you develop nausea and vomiting that is not controlled by your nausea medication, call the clinic. If it is after clinic hours your family physician or the after hours number for the clinic or go to the Emergency Department.   BELOW ARE SYMPTOMS THAT SHOULD BE REPORTED IMMEDIATELY:  *FEVER GREATER THAN 101.0 F  *CHILLS WITH OR WITHOUT FEVER  NAUSEA AND VOMITING THAT IS NOT CONTROLLED WITH YOUR NAUSEA MEDICATION  *UNUSUAL SHORTNESS OF BREATH  *UNUSUAL BRUISING OR BLEEDING  TENDERNESS IN MOUTH AND THROAT WITH OR WITHOUT PRESENCE OF ULCERS  *URINARY PROBLEMS  *BOWEL PROBLEMS  UNUSUAL RASH Items with * indicate a potential emergency and should be followed up as soon as possible.  One of the nurses will contact you 24 hours after your treatment. Please let the nurse know about any problems that you may have experienced. Feel free to call the clinic you have any questions or concerns. The clinic phone number is [phone].   I have been informed and understand all the instructions given to me. I know to contact the clinic, my physician, or go to the Emergency Department if any problems should occur. I do not have any questions at this time, but understand that I may call the clinic during office hours or the Patient Navigator at [phone] should I have any questions or need assistance in obtaining follow up care.    __________________________________________  _____________  __________ Signature of Patient or Authorized Representative            Date                    Time    __________________________________________ Nurse's Signature

## 2012-03-19 ENCOUNTER — Ambulatory Visit
Admission: RE | Admit: 2012-03-19 | Discharge: 2012-03-19 | Disposition: A | Discharge status: Home / Self Care | Payer: Federal, State, Local not specified - PPO | Source: Ambulatory Visit | Attending: Radiation Oncology | Admitting: Radiation Oncology

## 2012-03-20 ENCOUNTER — Encounter: Payer: Self-pay | Admitting: Nutrition

## 2012-03-20 ENCOUNTER — Ambulatory Visit
Admission: RE | Admit: 2012-03-20 | Discharge: 2012-03-20 | Disposition: A | Discharge status: Home / Self Care | Payer: Federal, State, Local not specified - PPO | Source: Ambulatory Visit | Attending: Radiation Oncology | Admitting: Radiation Oncology

## 2012-03-20 NOTE — Progress Notes (Signed)
Patient did not show for nutrition appointment.  This is his second "no show" with me.

## 2012-03-21 ENCOUNTER — Ambulatory Visit
Admission: RE | Admit: 2012-03-21 | Discharge: 2012-03-21 | Disposition: A | Discharge status: Home / Self Care | Payer: Federal, State, Local not specified - PPO | Source: Ambulatory Visit | Attending: Radiation Oncology | Admitting: Radiation Oncology

## 2012-03-22 ENCOUNTER — Encounter: Payer: Self-pay | Admitting: Radiation Oncology

## 2012-03-22 ENCOUNTER — Ambulatory Visit
Admission: RE | Admit: 2012-03-22 | Discharge: 2012-03-22 | Disposition: A | Discharge status: Home / Self Care | Payer: Federal, State, Local not specified - PPO | Source: Ambulatory Visit | Attending: Radiation Oncology | Admitting: Radiation Oncology

## 2012-03-22 VITALS — BP 137/75 | HR 89 | Temp 98.9°F | Resp 20 | Wt 148.3 lb

## 2012-03-22 DIAGNOSIS — C099 Malignant neoplasm of tonsil, unspecified: Secondary | ICD-10-CM

## 2012-03-22 NOTE — Progress Notes (Signed)
  Radiation Oncology         [phone] ________________________________  Name: Austin Mathis MRN: [id number]  Date: 03/22/2012  DOB: Dec 17, 1940  Weekly Radiation Therapy Management  Current Dose: 70 Gy     Planned Dose:  70 Gy  Narrative . . . . . . . . The patient presents for the final under treatment assessment.                                        The patient has had some continuation of previously noted sore throat.                                 Set-up films were reviewed.                                 The chart was checked. Physical Findings. . . Weight essentially stable.  He has grade 3 mucositis and grade 2-3 dermatitis. Impression . . . . . . . The patient tolerated radiation relatively well. Plan . . . . . . . . . . . . Complete radiation today as scheduled, and follow-up in one month. The patient was encouraged to call or return to the clinic in the interim for any worsening symptoms.  ________________________________  Artist Pais Kathrynn Running, M.D.

## 2012-03-22 NOTE — Progress Notes (Signed)
patient alert,oriented x3,  weeekly rad tx completed today 35/35, h/n, patient hoarse, coughing up whitish/yellow pheglm, takes lortab elixir for pain in throat, eats 3x day, had 3 poached eggs ,milk, coffee this am,drinks 2 ensures daily"I force myself to eat, no taste", refuses to see Zenovia Jarred, said"I'm eating enough,don't need to see her", neck dry,  Dry, skin, has peeled , erythema, no moistness, using biafine bid,gave 1 month f/u appt  8:37 AM

## 2012-03-26 ENCOUNTER — Other Ambulatory Visit (HOSPITAL_COMMUNITY): Payer: Self-pay | Admitting: *Deleted

## 2012-03-26 DIAGNOSIS — C099 Malignant neoplasm of tonsil, unspecified: Secondary | ICD-10-CM

## 2012-03-28 ENCOUNTER — Inpatient Hospital Stay (HOSPITAL_COMMUNITY)
Admission: EM | Admit: 2012-03-28 | Discharge: 2012-03-31 | DRG: 541 | Disposition: A | Discharge status: Home / Self Care | Payer: Federal, State, Local not specified - PPO | Attending: Internal Medicine | Admitting: Internal Medicine

## 2012-03-28 ENCOUNTER — Encounter (HOSPITAL_COMMUNITY): Payer: Self-pay | Admitting: *Deleted

## 2012-03-28 DIAGNOSIS — IMO0002 Reserved for concepts with insufficient information to code with codable children: Secondary | ICD-10-CM

## 2012-03-28 DIAGNOSIS — R1313 Dysphagia, pharyngeal phase: Secondary | ICD-10-CM | POA: Diagnosis present

## 2012-03-28 DIAGNOSIS — R5383 Other fatigue: Secondary | ICD-10-CM | POA: Diagnosis not present

## 2012-03-28 DIAGNOSIS — E860 Dehydration: Secondary | ICD-10-CM | POA: Diagnosis present

## 2012-03-28 DIAGNOSIS — K219 Gastro-esophageal reflux disease without esophagitis: Secondary | ICD-10-CM | POA: Diagnosis present

## 2012-03-28 DIAGNOSIS — C099 Malignant neoplasm of tonsil, unspecified: Secondary | ICD-10-CM | POA: Diagnosis present

## 2012-03-28 DIAGNOSIS — Z79899 Other long term (current) drug therapy: Secondary | ICD-10-CM

## 2012-03-28 DIAGNOSIS — E7800 Pure hypercholesterolemia, unspecified: Secondary | ICD-10-CM | POA: Diagnosis present

## 2012-03-28 DIAGNOSIS — D649 Anemia, unspecified: Secondary | ICD-10-CM | POA: Diagnosis present

## 2012-03-28 DIAGNOSIS — I10 Essential (primary) hypertension: Secondary | ICD-10-CM | POA: Diagnosis present

## 2012-03-28 DIAGNOSIS — J189 Pneumonia, unspecified organism: Secondary | ICD-10-CM

## 2012-03-28 DIAGNOSIS — N179 Acute kidney failure, unspecified: Secondary | ICD-10-CM | POA: Diagnosis present

## 2012-03-28 DIAGNOSIS — J029 Acute pharyngitis, unspecified: Secondary | ICD-10-CM | POA: Diagnosis present

## 2012-03-28 DIAGNOSIS — E43 Unspecified severe protein-calorie malnutrition: Secondary | ICD-10-CM | POA: Diagnosis present

## 2012-03-28 DIAGNOSIS — Z9221 Personal history of antineoplastic chemotherapy: Secondary | ICD-10-CM

## 2012-03-28 DIAGNOSIS — J13 Pneumonia due to Streptococcus pneumoniae: Secondary | ICD-10-CM | POA: Diagnosis present

## 2012-03-28 DIAGNOSIS — Z931 Gastrostomy status: Secondary | ICD-10-CM

## 2012-03-28 DIAGNOSIS — H9190 Unspecified hearing loss, unspecified ear: Secondary | ICD-10-CM | POA: Diagnosis present

## 2012-03-28 DIAGNOSIS — Z923 Personal history of irradiation: Secondary | ICD-10-CM

## 2012-03-28 NOTE — ED Notes (Signed)
Pt with weakness since Friday, last chemo and radiation done on Friday, cancer to tonsils

## 2012-03-28 NOTE — ED Provider Notes (Signed)
History  This chart was scribed for Austin Jakes, MD by Ladona Ridgel Day. This patient was seen in room APA01/APA01 and the patient's care was started at 2117.   CSN: [id number]  Arrival date & time 03/28/12  2117   First MD Initiated Contact with Patient 03/28/12 2309      Chief Complaint  Patient presents with  . Weakness   Patient is a 71 y.o. male presenting with weakness. The history is provided by the patient. No language interpreter was used.  Weakness The primary symptoms include fever. Primary symptoms do not include nausea or vomiting. The symptoms began 5 to 7 days ago. The episode lasted 6 days. The symptoms are worsening. The neurological symptoms are diffuse. Context: Recently finished chemo treatment.  Additional symptoms include weakness. Medical issues also include cancer.   Austin Mathis is a 71 y.o. male who presents to the Emergency Department complaining of increased weakness and failure to thrive at home after last chemo and radiation treatment 6 days ago. Family states he has no energy, increased weakness, eating/drinking less. He had tonsil CA and associated head/neck CA. Family is worried that he is dehydrated and has not drank any fluids today or voided. He also had a fever of 101 today. He has a follow up appointment Monday with Dr. Phylliss Bob.    [facility] Dr. Phylliss Bob last seen Monday here  PCP Dr. Rockey Situ  Past Medical History  Diagnosis Date  . High cholesterol   . Hearing deficit     Right greater than left.  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 Hypertension     EKG 5/13, chest CT 4/13 [facility], clearance with OV 01/08/12 oncology [facility]  . GERD (gastroesophageal reflux disease)   . 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 Portacath placement 11/24/2011    left side  . Multiple extractions with alveoloplasty 01/10/2012    Procedure: MULTIPLE EXTRACION WITH  ALVEOLOPLASTY;  Surgeon: Charlynne Pander, DDS;  Location: [facility];  Service: Oral Surgery;  Laterality: N/A;  Extraction of tooth #'s 1,3,4,5,6,7,8,9,10,11,12,13,14,15,16,17,18,20,21,22, 23,24,25,26,27,28,29,30 with alveoloplasty.  . Esophagogastroduodenoscopy 01/16/2012    Procedure: ESOPHAGOGASTRODUODENOSCOPY (EGD);  Surgeon: West Bali, MD;  Location: [facility];  Service: Endoscopy;  Laterality: N/A;  9:30  . Peg placement 01/16/2012    Procedure: PERCUTANEOUS ENDOSCOPIC GASTROSTOMY (PEG) PLACEMENT;  Surgeon: West Bali, MD;  Location: [facility];  Service: Endoscopy;  Laterality: N/A;  Ancef one gram IV before procedure    Family History  Problem Relation Age of Onset  . Hypertension Mother     History  Substance Use Topics  . Smoking status: Never Smoker   . Smokeless tobacco: Never Used  . Alcohol Use: No     Rare use of alcohol.      Review of Systems  Constitutional: Positive for fever. Negative for chills.       Fatigue, and failure to thrive at home. Eating/drinking less.   Respiratory: Negative for shortness of breath.   Cardiovascular: Negative for chest pain.  Gastrointestinal: Negative for nausea, vomiting and abdominal pain.  Musculoskeletal: Negative for back pain.  Neurological: Positive for weakness.  All other systems reviewed and are negative.    Allergies  Review of patient's allergies indicates no known allergies.  Home Medications   Current Outpatient Rx  Name Route Sig Dispense Refill  . PRAVASTATIN SODIUM  40 MG PO TABS Oral Take 40 mg by mouth at bedtime.    Marland Kitchen VERAPAMIL HCL ER 120 MG PO TBCR Oral Take 120 mg by mouth at bedtime.    . ACYCLOVIR 400 MG PO TABS Oral Take 400 mg by mouth 2 (two) times daily.    Marland Kitchen MAGIC MOUTHWASH W/LIDOCAINE Oral Take 10 mLs by mouth 4 (four) times daily as needed. 600 mL 1    One part maalox/mylanta, one part benadryl, one pa ...  . MAGIC MOUTHWASH Oral Take 5 mLs by mouth 4 (four) times daily as  needed. 300 mL 1  . DEXAMETHASONE 4 MG PO TABS Oral Take 8 mg by mouth. Starting the day after chemo, take 2 tablets = 8mg  daily x 2 days. Take with food.    Marland Kitchen FLUCONAZOLE 100 MG PO TABS  Take one tablet daily, PO until oral thrush resolves. 30 tablet 1  . RADIAPLEXRX EX GEL Topical Apply topically 2 (two) times daily.    Marland Kitchen LIDOCAINE-PRILOCAINE 2.5-2.5 % EX CREA Topical Apply 1 application topically as needed. To access port    . LISINOPRIL 40 MG PO TABS Oral Take 40 mg by mouth every morning.     Marland Kitchen LORAZEPAM 1 MG PO TABS Oral Take 1 mg by mouth every 4 (four) hours as needed. Nausea/vomiting    . MAGNESIUM OXIDE 400 (241.3 MG) MG PO TABS Oral Take 400 mg by mouth as directed. Twice a day every other day    . OMEPRAZOLE 20 MG PO CPDR Oral Take 1 capsule (20 mg total) by mouth daily. 30 capsule 2  . ONDANSETRON HCL 8 MG PO TABS Oral Take by mouth. Starting the day after chemo, take 1 tablet in am and 1 tablet in pm x 2 days. Then may take 1 tablet twice a day IF needed for nausea/vomiting.    Marland Kitchen POTASSIUM CHLORIDE 20 MEQ/15ML (10%) PO LIQD Oral Take 20 mEq by mouth every other day.       Triage Vitals: BP 126/82  Pulse 116  Temp 99.2 F (37.3 C) (Oral)  Resp 20  Ht 5\' 7"  (1.702 m)  Wt 147 lb (66.679 kg)  BMI 23.02 kg/m2  SpO2 99%  Physical Exam  Nursing note and vitals reviewed. Constitutional: He is oriented to person, place, and time. He appears well-developed and well-nourished. No distress.  HENT:  Head: Normocephalic and atraumatic.  Eyes: Conjunctivae and EOM are normal.  Neck: Neck supple. No tracheal deviation present.  Cardiovascular: Normal rate, regular rhythm and normal heart sounds.   No murmur heard. Pulmonary/Chest: Effort normal and breath sounds normal. No respiratory distress. He has no wheezes. He has no rales.  Abdominal: Soft. Bowel sounds are normal. He exhibits no distension. There is no tenderness. There is no rebound and no guarding.  Musculoskeletal: Normal  range of motion. He exhibits no edema and no tenderness.  Neurological: He is alert and oriented to person, place, and time.  Skin: Skin is warm and dry.  Psychiatric: He has a normal mood and affect. His behavior is normal.    ED Course  Procedures (including critical care time) DIAGNOSTIC STUDIES: Oxygen Saturation is 99% on room air, normal by my interpretation.    COORDINATION OF CARE: At 1140 Discussed treatment plan with patient which includes blood work, CXR and IV fluids. Patient agrees.   Labs Reviewed  CBC WITH DIFFERENTIAL - Abnormal; Notable for the following:    RBC 2.76 (*)     Hemoglobin 8.9 (*)  HCT 25.8 (*)     Platelets 131 (*)     Neutrophils Relative 84 (*)     Lymphocytes Relative 8 (*)     Lymphs Abs 0.5 (*)     All other components within normal limits  COMPREHENSIVE METABOLIC PANEL - Abnormal; Notable for the following:    Sodium 133 (*)     Chloride 95 (*)     Glucose, Bld 122 (*)     BUN 31 (*)     Creatinine, Ser 1.68 (*)     Calcium 8.2 (*)     Albumin 2.3 (*)     AST 52 (*)     ALT 71 (*)     GFR calc non Af Amer 39 (*)     GFR calc Af Amer 45 (*)     All other components within normal limits  URINALYSIS, ROUTINE W REFLEX MICROSCOPIC   Dg Chest Port 1 View  03/29/2012  *RADIOLOGY REPORT*  Clinical Data: Weakness, cough and fever.  History of squamous cell carcinoma of the tonsils status post recent chemotherapy and radiation therapy.  PORTABLE CHEST - 1 VIEW  Comparison: Portable chest 11/24/2011.  PET CT 11/23/2011.  Findings: Left subclavian Port-A-Cath tip is unchanged at the level of the mid SVC.  The heart size and mediastinal contours are stable.  There are new patchy airspace opacities within the right upper and lower lobes.  The left lung is clear.  There is no pleural effusion.  IMPRESSION: New right lung air space opacities are nonspecific and may represent infection, aspiration or radiation change.  Correlate clinically.   Original  Report Authenticated By: Gerrianne Scale, M.D.    Results for orders placed during the hospital encounter of 03/28/12  CBC WITH DIFFERENTIAL      Component Value Range   WBC 6.1  4.0 - 10.5 K/uL   RBC 2.76 (*) 4.22 - 5.81 MIL/uL   Hemoglobin 8.9 (*) 13.0 - 17.0 g/dL   HCT 16.1 (*) 09.6 - 04.5 %   MCV 93.5  78.0 - 100.0 fL   MCH 32.2  26.0 - 34.0 pg   MCHC 34.5  30.0 - 36.0 g/dL   RDW 40.9  81.1 - 91.4 %   Platelets 131 (*) 150 - 400 K/uL   Neutrophils Relative 84 (*) 43 - 77 %   Lymphocytes Relative 8 (*) 12 - 46 %   Monocytes Relative 8  3 - 12 %   Eosinophils Relative 0  0 - 5 %   Basophils Relative 0  0 - 1 %   Neutro Abs 5.1  1.7 - 7.7 K/uL   Lymphs Abs 0.5 (*) 0.7 - 4.0 K/uL   Monocytes Absolute 0.5  0.1 - 1.0 K/uL   Eosinophils Absolute 0.0  0.0 - 0.7 K/uL   Basophils Absolute 0.0  0.0 - 0.1 K/uL   RBC Morphology STOMATOCYTES     WBC Morphology INCREASED BANDS (>20% BANDS)    COMPREHENSIVE METABOLIC PANEL      Component Value Range   Sodium 133 (*) 135 - 145 mEq/L   Potassium 4.1  3.5 - 5.1 mEq/L   Chloride 95 (*) 96 - 112 mEq/L   CO2 28  19 - 32 mEq/L   Glucose, Bld 122 (*) 70 - 99 mg/dL   BUN 31 (*) 6 - 23 mg/dL   Creatinine, Ser 7.82 (*) 0.50 - 1.35 mg/dL   Calcium 8.2 (*) 8.4 - 10.5 mg/dL   Total Protein  6.4  6.0 - 8.3 g/dL   Albumin 2.3 (*) 3.5 - 5.2 g/dL   AST 52 (*) 0 - 37 U/L   ALT 71 (*) 0 - 53 U/L   Alkaline Phosphatase 70  39 - 117 U/L   Total Bilirubin 0.4  0.3 - 1.2 mg/dL   GFR calc non Af Amer 39 (*) >90 mL/min   GFR calc Af Amer 45 (*) >90 mL/min  URINALYSIS, ROUTINE W REFLEX MICROSCOPIC      Component Value Range   Color, Urine YELLOW  YELLOW   APPearance CLEAR  CLEAR   Specific Gravity, Urine 1.015  1.005 - 1.030   pH 5.5  5.0 - 8.0   Glucose, UA NEGATIVE  NEGATIVE mg/dL   Hgb urine dipstick NEGATIVE  NEGATIVE   Bilirubin Urine NEGATIVE  NEGATIVE   Ketones, ur NEGATIVE  NEGATIVE mg/dL   Protein, ur NEGATIVE  NEGATIVE mg/dL    Urobilinogen, UA 0.2  0.0 - 1.0 mg/dL   Nitrite NEGATIVE  NEGATIVE   Leukocytes, UA NEGATIVE  NEGATIVE     1. HCAP (healthcare-associated pneumonia)       MDM  Workup in the emergency department raises concern for right-sided pneumonia developing also patient clinically has dehydration. In some elevation of the BUN and creatinine is suggested based on labs. Patient is a cancer patient has had frequent visits to the hospital will be treated as a health care acquired pneumonia antibiotics ordered here not anticipated that he will be in ICU admission so no blood cultures done. Discussed with hospitalist he will be admitted to MedSurg.   I personally performed the services described in this documentation, which was scribed in my presence. The recorded information has been reviewed and considered.           Austin Jakes, MD 03/29/12 [phone]

## 2012-03-29 ENCOUNTER — Encounter (HOSPITAL_COMMUNITY): Payer: Self-pay | Admitting: Internal Medicine

## 2012-03-29 ENCOUNTER — Emergency Department (HOSPITAL_COMMUNITY): Payer: Federal, State, Local not specified - PPO

## 2012-03-29 DIAGNOSIS — D649 Anemia, unspecified: Secondary | ICD-10-CM | POA: Diagnosis present

## 2012-03-29 DIAGNOSIS — E7800 Pure hypercholesterolemia, unspecified: Secondary | ICD-10-CM | POA: Diagnosis present

## 2012-03-29 DIAGNOSIS — K219 Gastro-esophageal reflux disease without esophagitis: Secondary | ICD-10-CM | POA: Diagnosis present

## 2012-03-29 DIAGNOSIS — H9190 Unspecified hearing loss, unspecified ear: Secondary | ICD-10-CM | POA: Diagnosis present

## 2012-03-29 DIAGNOSIS — J189 Pneumonia, unspecified organism: Secondary | ICD-10-CM

## 2012-03-29 DIAGNOSIS — R5381 Other malaise: Secondary | ICD-10-CM | POA: Diagnosis present

## 2012-03-29 DIAGNOSIS — R1313 Dysphagia, pharyngeal phase: Secondary | ICD-10-CM

## 2012-03-29 DIAGNOSIS — C099 Malignant neoplasm of tonsil, unspecified: Secondary | ICD-10-CM

## 2012-03-29 DIAGNOSIS — I10 Essential (primary) hypertension: Secondary | ICD-10-CM | POA: Diagnosis present

## 2012-03-29 DIAGNOSIS — J029 Acute pharyngitis, unspecified: Secondary | ICD-10-CM | POA: Diagnosis present

## 2012-03-29 DIAGNOSIS — E43 Unspecified severe protein-calorie malnutrition: Secondary | ICD-10-CM | POA: Diagnosis present

## 2012-03-29 DIAGNOSIS — N179 Acute kidney failure, unspecified: Secondary | ICD-10-CM

## 2012-03-29 DIAGNOSIS — J13 Pneumonia due to Streptococcus pneumoniae: Secondary | ICD-10-CM | POA: Diagnosis present

## 2012-03-29 DIAGNOSIS — Z931 Gastrostomy status: Secondary | ICD-10-CM

## 2012-03-29 LAB — URINALYSIS, ROUTINE W REFLEX MICROSCOPIC
Bilirubin Urine: NEGATIVE
Hgb urine dipstick: NEGATIVE
Nitrite: NEGATIVE
Specific Gravity, Urine: 1.015 (ref 1.005–1.030)
Urobilinogen, UA: 0.2 mg/dL (ref 0.0–1.0)
pH: 5.5 (ref 5.0–8.0)

## 2012-03-29 LAB — CBC WITH DIFFERENTIAL/PLATELET
Basophils Absolute: 0 10*3/uL (ref 0.0–0.1)
Eosinophils Absolute: 0 10*3/uL (ref 0.0–0.7)
Hemoglobin: 8.9 g/dL — ABNORMAL LOW (ref 13.0–17.0)
Lymphocytes Relative: 8 % — ABNORMAL LOW (ref 12–46)
MCH: 32.2 pg (ref 26.0–34.0)
MCHC: 34.5 g/dL (ref 30.0–36.0)
Monocytes Absolute: 0.5 10*3/uL (ref 0.1–1.0)
Neutrophils Relative %: 84 % — ABNORMAL HIGH (ref 43–77)
Platelets: 131 10*3/uL — ABNORMAL LOW (ref 150–400)
RDW: 15.1 % (ref 11.5–15.5)
WBC Morphology: INCREASED

## 2012-03-29 LAB — CREATININE, SERUM
Creatinine, Ser: 1.52 mg/dL — ABNORMAL HIGH (ref 0.50–1.35)
GFR calc Af Amer: 51 mL/min — ABNORMAL LOW (ref >90)
GFR calc non Af Amer: 44 mL/min — ABNORMAL LOW (ref >90)

## 2012-03-29 LAB — CBC
Hemoglobin: 8.2 g/dL — ABNORMAL LOW (ref 13.0–17.0)
Platelets: 139 10*3/uL — ABNORMAL LOW (ref 150–400)
RBC: 2.56 MIL/uL — ABNORMAL LOW (ref 4.22–5.81)
WBC: 4.6 10*3/uL (ref 4.0–10.5)

## 2012-03-29 LAB — COMPREHENSIVE METABOLIC PANEL
ALT: 71 U/L — ABNORMAL HIGH (ref 0–53)
AST: 52 U/L — ABNORMAL HIGH (ref 0–37)
Calcium: 8.2 mg/dL — ABNORMAL LOW (ref 8.4–10.5)
Creatinine, Ser: 1.68 mg/dL — ABNORMAL HIGH (ref 0.50–1.35)
GFR calc non Af Amer: 39 mL/min — ABNORMAL LOW (ref >90)
Sodium: 133 mEq/L — ABNORMAL LOW (ref 135–145)
Total Protein: 6.4 g/dL (ref 6.0–8.3)

## 2012-03-29 LAB — STREP PNEUMONIAE URINARY ANTIGEN: Strep Pneumo Urinary Antigen: POSITIVE — AB

## 2012-03-29 LAB — PREPARE RBC (CROSSMATCH)

## 2012-03-29 MED ORDER — HEPARIN SOD (PORK) LOCK FLUSH 100 UNIT/ML IV SOLN: 250.0000 [IU] | INTRAVENOUS | Status: DC | PRN

## 2012-03-29 MED ORDER — FLUCONAZOLE IN SODIUM CHLORIDE 200-0.9 MG/100ML-% IV SOLN
INTRAVENOUS | Status: AC
  Filled 2012-03-29: qty 100

## 2012-03-29 MED ORDER — DEXTROSE 5 % IV SOLN: 1.0000 g | Freq: Three times a day (TID) | INTRAVENOUS | Status: DC

## 2012-03-29 MED ORDER — SODIUM CHLORIDE 0.9 % IJ SOLN
3.0000 mL | Freq: Two times a day (BID) | INTRAMUSCULAR | Status: DC
  Administered 2012-03-29 – 2012-03-31 (×4): 3 mL via INTRAVENOUS
  Filled 2012-03-29 (×3): qty 3

## 2012-03-29 MED ORDER — HEPARIN SOD (PORK) LOCK FLUSH 100 UNIT/ML IV SOLN: 500.0000 [IU] | Freq: Every day | INTRAVENOUS | Status: DC | PRN

## 2012-03-29 MED ORDER — ACETAMINOPHEN 80 MG/0.8ML PO SUSP
650.0000 mg | ORAL | Status: DC | PRN
  Filled 2012-03-29: qty 15

## 2012-03-29 MED ORDER — PIPERACILLIN-TAZOBACTAM 3.375 G IVPB
3.3750 g | Freq: Three times a day (TID) | INTRAVENOUS | Status: DC
  Administered 2012-03-29 – 2012-03-31 (×7): 3.375 g via INTRAVENOUS
  Filled 2012-03-29 (×11): qty 50

## 2012-03-29 MED ORDER — SODIUM CHLORIDE 0.9 % IJ SOLN
INTRAMUSCULAR | Status: AC
  Administered 2012-03-29: 10 mL
  Filled 2012-03-29: qty 3

## 2012-03-29 MED ORDER — OSMOLITE 1.5 CAL PO LIQD
237.0000 mL | Freq: Four times a day (QID) | ORAL | Status: DC
  Filled 2012-03-29 (×13): qty 237

## 2012-03-29 MED ORDER — BISACODYL 10 MG RE SUPP: 10.0000 mg | Freq: Every day | RECTAL | Status: DC | PRN

## 2012-03-29 MED ORDER — ENOXAPARIN SODIUM 40 MG/0.4ML ~~LOC~~ SOLN
40.0000 mg | SUBCUTANEOUS | Status: DC
  Administered 2012-03-29 – 2012-03-31 (×3): 40 mg via SUBCUTANEOUS
  Filled 2012-03-29 (×3): qty 0.4

## 2012-03-29 MED ORDER — ONDANSETRON HCL 4 MG/2ML IJ SOLN
4.0000 mg | INTRAMUSCULAR | Status: DC | PRN
  Administered 2012-03-30 – 2012-03-31 (×5): 4 mg via INTRAVENOUS
  Filled 2012-03-29 (×5): qty 2

## 2012-03-29 MED ORDER — DEXTROSE 5 % IV SOLN
1.0000 g | INTRAVENOUS | Status: DC
  Filled 2012-03-29 (×2): qty 1

## 2012-03-29 MED ORDER — LEVOFLOXACIN IN D5W 750 MG/150ML IV SOLN
750.0000 mg | INTRAVENOUS | Status: DC
  Administered 2012-03-29: 750 mg via INTRAVENOUS
  Filled 2012-03-29: qty 150

## 2012-03-29 MED ORDER — LEVOFLOXACIN IN D5W 500 MG/100ML IV SOLN
500.0000 mg | INTRAVENOUS | Status: AC
Start: 2012-03-30 — End: 2012-03-31
  Administered 2012-03-30 – 2012-03-31 (×2): 500 mg via INTRAVENOUS
  Filled 2012-03-29 (×2): qty 100

## 2012-03-29 MED ORDER — VANCOMYCIN HCL IN DEXTROSE 1-5 GM/200ML-% IV SOLN
1000.0000 mg | Freq: Once | INTRAVENOUS | Status: AC
  Administered 2012-03-29: 1000 mg via INTRAVENOUS
  Filled 2012-03-29: qty 200

## 2012-03-29 MED ORDER — FLEET ENEMA 7-19 GM/118ML RE ENEM: 1.0000 | ENEMA | Freq: Every day | RECTAL | Status: DC | PRN

## 2012-03-29 MED ORDER — SODIUM CHLORIDE 0.9 % IV SOLN
INTRAVENOUS | Status: DC
  Administered 2012-03-30 – 2012-03-31 (×3): via INTRAVENOUS

## 2012-03-29 MED ORDER — JEVITY 1.2 CAL PO LIQD
237.0000 mL | Freq: Four times a day (QID) | ORAL | Status: DC
  Administered 2012-03-29: 237 mL
  Filled 2012-03-29 (×14): qty 237

## 2012-03-29 MED ORDER — SODIUM CHLORIDE 0.9 % IJ SOLN
10.0000 mL | INTRAMUSCULAR | Status: AC | PRN
  Administered 2012-03-29: 10 mL
  Filled 2012-03-29: qty 3

## 2012-03-29 MED ORDER — SODIUM CHLORIDE 0.9 % IV BOLUS (SEPSIS)
1000.0000 mL | Freq: Once | INTRAVENOUS | Status: AC
  Administered 2012-03-29: 1000 mL via INTRAVENOUS

## 2012-03-29 MED ORDER — SODIUM CHLORIDE 0.9 % IV SOLN
INTRAVENOUS | Status: DC
  Administered 2012-03-29: 04:00:00 via INTRAVENOUS

## 2012-03-29 MED ORDER — ACETAMINOPHEN 160 MG/5ML PO SOLN: 650.0000 mg | ORAL | Status: DC | PRN

## 2012-03-29 MED ORDER — PANTOPRAZOLE SODIUM 40 MG IV SOLR
40.0000 mg | INTRAVENOUS | Status: DC
  Administered 2012-03-29: 40 mg via INTRAVENOUS
  Filled 2012-03-29 (×2): qty 40

## 2012-03-29 MED ORDER — ONDANSETRON HCL 4 MG/2ML IJ SOLN
4.0000 mg | Freq: Once | INTRAMUSCULAR | Status: AC
  Administered 2012-03-29: 4 mg via INTRAVENOUS
  Filled 2012-03-29: qty 2

## 2012-03-29 MED ORDER — SODIUM CHLORIDE 0.9 % IV SOLN
250.0000 mL | Freq: Once | INTRAVENOUS | Status: AC
  Administered 2012-03-29: 250 mL via INTRAVENOUS

## 2012-03-29 MED ORDER — DIPHENHYDRAMINE HCL 25 MG PO CAPS
25.0000 mg | ORAL_CAPSULE | Freq: Once | ORAL | Status: AC
  Administered 2012-03-29: 25 mg via ORAL
  Filled 2012-03-29: qty 1

## 2012-03-29 MED ORDER — FLUCONAZOLE 100MG IVPB
100.0000 mg | INTRAVENOUS | Status: DC
  Administered 2012-03-29 – 2012-03-30 (×2): 100 mg via INTRAVENOUS
  Filled 2012-03-29 (×4): qty 50

## 2012-03-29 MED ORDER — SODIUM CHLORIDE 0.9 % IJ SOLN
3.0000 mL | INTRAMUSCULAR | Status: AC | PRN
  Administered 2012-03-30: 3 mL
  Filled 2012-03-29: qty 3

## 2012-03-29 MED ORDER — ACYCLOVIR SODIUM 50 MG/ML IV SOLN
5.0000 mg/kg | Freq: Two times a day (BID) | INTRAVENOUS | Status: DC
  Administered 2012-03-29 (×2): 335 mg via INTRAVENOUS
  Filled 2012-03-29 (×7): qty 6.7

## 2012-03-29 MED ORDER — VANCOMYCIN HCL IN DEXTROSE 1-5 GM/200ML-% IV SOLN
INTRAVENOUS | Status: AC
  Filled 2012-03-29: qty 200

## 2012-03-29 MED ORDER — OSMOLITE 1.5 CAL PO LIQD
237.0000 mL | Freq: Four times a day (QID) | ORAL | Status: DC
  Administered 2012-03-29 – 2012-03-31 (×6): 237 mL
  Filled 2012-03-29 (×6): qty 1000

## 2012-03-29 MED ORDER — SODIUM CHLORIDE 0.9 % IV SOLN: 250.0000 mL | INTRAVENOUS | Status: DC | PRN

## 2012-03-29 MED ORDER — ACETAMINOPHEN 325 MG PO TABS
650.0000 mg | ORAL_TABLET | Freq: Once | ORAL | Status: AC
  Administered 2012-03-29: 650 mg via ORAL
  Filled 2012-03-29: qty 2

## 2012-03-29 MED ORDER — VANCOMYCIN HCL IN DEXTROSE 1-5 GM/200ML-% IV SOLN
1000.0000 mg | INTRAVENOUS | Status: DC
  Administered 2012-03-30: 1000 mg via INTRAVENOUS
  Filled 2012-03-29 (×3): qty 200

## 2012-03-29 MED ORDER — SODIUM CHLORIDE 0.9 % IJ SOLN
10.0000 mL | INTRAMUSCULAR | Status: AC | PRN
  Administered 2012-03-29: 10 mL

## 2012-03-29 NOTE — Progress Notes (Signed)
  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   He did have the expected skin reaction as well as a sore throat and difficulty swallowing given his treatment site. He also did experience some fatigue.  Plan: The patient has completed radiation treatment. The patient will return to radiation oncology clinic for routine followup in one month. I advised them to call or return sooner if they have any questions or concerns related to their recovery or treatment.  -----------------------------------  Billie Lade, PhD, MD

## 2012-03-29 NOTE — Progress Notes (Addendum)
INITIAL ADULT NUTRITION ASSESSMENT Date: 03/29/2012   Time: 12:11 PM Reason for Assessment: MD consult TF initiation and management, MST= 3  INTERVENTION: Initiate bolus feedings of Osmolite 1.5- 1 can with 150 ml flush 4 times daily. Total regimen including flushes will provide 1420 kcals, 60 grams protein and 1324 ml fluid daily. Total regimen will provide 75% of estimated energy needs, 79% of estimated protein needs, and 74% of estimated fluid needs daily.   ASSESSMENT: Male 71 y.o.  Dx: Healthcare-associated pneumonia  Hx:  Past Medical History  Diagnosis Date  . High cholesterol   . Hearing deficit     Right greater than left.  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 Hypertension     EKG 5/13, chest CT 4/13 [facility], clearance with OV 01/08/12 oncology [facility]  . GERD (gastroesophageal reflux disease)   . 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 Portacath placement 11/24/2011    left side  . Multiple extractions with alveoloplasty 01/10/2012    Procedure: MULTIPLE EXTRACION WITH ALVEOLOPLASTY;  Surgeon: Charlynne Pander, DDS;  Location: [facility];  Service: Oral Surgery;  Laterality: N/A;  Extraction of tooth #'s 1,3,4,5,6,7,8,9,10,11,12,13,14,15,16,17,18,20,21,22, 23,24,25,26,27,28,29,30 with alveoloplasty.  . Esophagogastroduodenoscopy 01/16/2012    Procedure: ESOPHAGOGASTRODUODENOSCOPY (EGD);  Surgeon: West Bali, MD;  Location: [facility];  Service: Endoscopy;  Laterality: N/A;  9:30  . Peg placement 01/16/2012    Procedure: PERCUTANEOUS ENDOSCOPIC GASTROSTOMY (PEG) PLACEMENT;  Surgeon: West Bali, MD;  Location: [facility];  Service: Endoscopy;  Laterality: N/A;  Ancef one gram IV before procedure   Related Meds:  Scheduled Meds:   . sodium chloride  250 mL Intravenous Once  . acetaminophen  650 mg Oral Once  . acyclovir  5 mg/kg Intravenous Q12H  . diphenhydrAMINE   25 mg Oral Once  . enoxaparin  40 mg Subcutaneous Q24H  . feeding supplement (JEVITY 1.2 CAL)  237 mL Per Tube QID  . fluconazole (DIFLUCAN) IV  100 mg Intravenous Q24H  . levofloxacin (LEVAQUIN) IV  500 mg Intravenous Q24H  . ondansetron  4 mg Intravenous Once  . pantoprazole (PROTONIX) IV  40 mg Intravenous Q24H  . piperacillin-tazobactam (ZOSYN)  IV  3.375 g Intravenous Q8H  . sodium chloride  1,000 mL Intravenous Once  . sodium chloride  3 mL Intravenous Q12H  . vancomycin  1,000 mg Intravenous Once  . vancomycin  1,000 mg Intravenous Q24H  . DISCONTD: sodium chloride   Intravenous STAT  . DISCONTD: ceFEPime (MAXIPIME) IV  1 g Intravenous Q8H  . DISCONTD: ceFEPime (MAXIPIME) IV  1 g Intravenous Q24H  . DISCONTD: levofloxacin (LEVAQUIN) IV  750 mg Intravenous Q24H   Continuous Infusions:   . sodium chloride     PRN Meds:.sodium chloride, acetaminophen (TYLENOL) oral liquid 160 mg/5 mL, bisacodyl, heparin lock flush, heparin lock flush, ondansetron (ZOFRAN) IV, sodium chloride, sodium chloride, sodium chloride, sodium phosphate, DISCONTD: acetaminophen  Ht: 5\' 7"  (170.2 cm)  Wt: 138 lb 14.2 oz (63 kg)  Ideal Wt:  66.1 kg % Ideal Wt: 93%  Usual Wt: 180# % Usual Wt: 77%  Body mass index is 21.75 kg/(m^2). Classified as normal weight.   Food/Nutrition Related Hx: Pt with hx of tonsillar CA, s/p chemo and radiation, recently completing tx. Pt lethargic at time of visit, so hx was obtained from pt wife and daughter. They confirm  that pt has progressively lost weight for the past 6 months. Chart review confirms this; they report UBW of 180#, which he was able to maintain until 3/13. Pt has lost 42# (23%) x 6 months and 21# (13%) x 1 month.  PO intake has recently declined. Pt has a limited PO diet which includes: eggs, Ramen noodles, soup, coffee, cream of wheat, smoothies, and milkshakes. Pt has PEG in place and family reports they administer 1 can Ensure 3 times daily via PEG. They  report they have recently putting pt's nightly milkshake through the PEG as well, due to limited PO intake. PTA Ensure regimen provided 750 kcals and 27 grams of protein, which provided 40% of estimated energy needs and 36% of estimated protein needs. They report that pt is still active and would like to continue bolus feedings.   Labs:  CMP     Component Value Date/Time   NA 133* 03/29/2012 0026   K 4.1 03/29/2012 0026   CL 95* 03/29/2012 0026   CO2 28 03/29/2012 0026   GLUCOSE 122* 03/29/2012 0026   BUN 31* 03/29/2012 0026   CREATININE 1.52* 03/29/2012 0603   CALCIUM 8.2* 03/29/2012 0026   PROT 6.4 03/29/2012 0026   ALBUMIN 2.3* 03/29/2012 0026   AST 52* 03/29/2012 0026   ALT 71* 03/29/2012 0026   ALKPHOS 70 03/29/2012 0026   BILITOT 0.4 03/29/2012 0026   GFRNONAA 44* 03/29/2012 0603   GFRAA 51* 03/29/2012 0603   Sodium  Date/Time Value Range Status  03/29/2012 12:26 AM 133* 135 - 145 mEq/L Final  03/18/2012  9:59 AM 138  135 - 145 mEq/L Final  03/11/2012  9:29 AM 136  135 - 145 mEq/L Final    Potassium  Date/Time Value Range Status  03/29/2012 12:26 AM 4.1  3.5 - 5.1 mEq/L Final  03/18/2012  9:59 AM 3.9  3.5 - 5.1 mEq/L Final  03/11/2012  9:29 AM 4.3  3.5 - 5.1 mEq/L Final    No results found for this basename: phos    Magnesium  Date/Time Value Range Status  12/28/2011 10:35 AM 1.5  1.5 - 2.5 mg/dL Final  4/78/2956  2:13 AM 1.3* 1.5 - 2.5 mg/dL Final  0/02/6577  4:69 PM 2.2  1.5 - 2.5 mg/dL Final   Intake:   Intake/Output Summary (Last 24 hours) at 03/29/12 1240 Last data filed at 03/29/12 1115  Gross per 24 hour  Intake    610 ml  Output    150 ml  Net    460 ml    Diet Order: NPO  Supplements/Tube Feeding: none at this time  IVF:    sodium chloride    Estimated Nutritional Needs:   Kcal:1881-2195 kcals daily Protein:75-94 grams protein daily Fluid:1.8-2.2 L fluid daily  NUTRITION DIAGNOSIS: -Inadequate oral intake (NI-2.1).  Status: Ongoing  RELATED TO: decreased appetite weight  loss  AS EVIDENCE BY: 21# wt loss x 1 month, wt decline x 6 months.   MONITORING/EVALUATION(Goals): Weight, TF tolerance, labs, changes in status. 1) Pt will maintain wt of 138# 2) Pt will meet >75% of estimated energy needs via TF  EDUCATION NEEDS: -Education needs addressed  Dietitian #: [phone]  DOCUMENTATION CODES Per approved criteria  -Severe malnutrition in the context of chronic illness    Melody Haver, RD, LDN 03/29/2012, 12:11 PM

## 2012-03-29 NOTE — H&P (Signed)
[facility] History and Physical  Austin Mathis  ZOX:[id number]  DOB: [birth year]   DOA: 03/29/2012   PCP:   [name], MD Oncologist: Glenford Peers, MD   Chief Complaint:  Progressive weakness for days  Because this gentleman is extremely hard of hearing, most of the history is from his wife and medical record, supplemented by handwritten notes from [place].  HPI: Austin Mathis is an 71 y.o. male.   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 Has been having progressive weakness and malaise, and in the past 24 hours was noted to have a temperature of 101. In the emergency room patient had a chest x-ray done which showed opacities in the right side of the chest compatible with radiation scars, aspiration or pneumonia. Hospitalist service was called to assist with management.  Patient does have a chronic cough which has not been getting worse, productive of thick pale yellow sputum. He produces copious quantities of saliva which she has difficulty swallowing. He has a gastrostomy tube to supplement his oral feeds, and recently he's been able to tolerate less and less oral because of progressive dysphagia.  Takes 3 cans of Ensure by PEG daily, and sometimes 2 milk shakes  Denies shortness of breath or chest pain. Has not been taking any antibiotics.  Review of his labs show a progressive rise in serum creatinine from 1.18 over the past month.   Rewiew of Systems:   All systems negative except as marked bold or noted in the HPI;  Constitutional: Negative chills. ;malaise, fever   Eyes: Negative for eye pain, redness and discharge. ;  ENMT: Negative for ear pain, hoarseness, nasal congestion, sinus pressure and sore throat. ;  Cardiovascular: Negative for chest pain, palpitations, diaphoresis, dyspnea; chronic peripheral edema. ;  Respiratory: Negative for , hemoptysis,  wheezing and stridor. ; cough Gastrointestinal: Negative for nausea, vomiting, diarrhea, constipation, abdominal pain, melena, blood in stool, hematemesis, jaundice and rectal bleeding. unusual weight loss..   Genitourinary: Negative for frequency, dysuria, incontinence,flank pain and hematuria; Musculoskeletal: Negative for back pain and neck pain. Negative for swelling and trauma.;  Skin: . Negative for pruritus, rash, abrasions, bruising and skin lesion.; ulcerations Neuro: Negative for headache, lightheadedness and neck stiffness. Negative for weakness, altered level of consciousness , altered mental status, extremity weakness, burning feet, involuntary movement, seizure and syncope.  Psych: negative for anxiety, depression, insomnia, tearfulness, panic attacks, hallucinations, paranoia, suicidal or homicidal ideation    Past Medical History  Diagnosis Date  . High cholesterol   . Hearing deficit     Right greater than left.  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 Hypertension     EKG 5/13, chest CT 4/13 EP[facility], clearance with OV 01/08/12 oncology [facility]  . GERD (gastroesophageal reflux disease)   . 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 Portacath placement 11/24/2011    left side  . Multiple extractions with alveoloplasty 01/10/2012    Procedure: MULTIPLE EXTRACION WITH ALVEOLOPLASTY;  Surgeon: Charlynne Pander, DDS;  Location: [facility];  Service: Oral Surgery;  Laterality: N/A;  Extraction of tooth #'s 1,3,4,5,6,7,8,9,10,11,12,13,14,15,16,17,18,20,21,22, 23,24,25,26,27,28,29,30 with alveoloplasty.  . Esophagogastroduodenoscopy 01/16/2012    Procedure: ESOPHAGOGASTRODUODENOSCOPY (EGD);  Surgeon: West Bali, MD;  Location: [facility];  Service: Endoscopy;  Laterality: N/A;  9:30  .  Peg placement 01/16/2012    Procedure: PERCUTANEOUS ENDOSCOPIC GASTROSTOMY (PEG) PLACEMENT;   Surgeon: West Bali, MD;  Location: [facility];  Service: Endoscopy;  Laterality: N/A;  Ancef one gram IV before procedure    Medications:  HOME MEDS: Prior to Admission medications   Medication Sig Start Date End Date Taking? Authorizing Provider  pravastatin (PRAVACHOL) 40 MG tablet Take 40 mg by mouth at bedtime.   Yes Historical Provider, MD  verapamil (CALAN-SR) 120 MG CR tablet Take 120 mg by mouth at bedtime.   Yes Historical Provider, MD  acyclovir (ZOVIRAX) 400 MG tablet Take 400 mg by mouth 2 (two) times daily.    Historical Provider, MD  Alum & Mag Hydroxide-Simeth (MAGIC MOUTHWASH W/LIDOCAINE) SOLN Take 10 mLs by mouth 4 (four) times daily as needed. 02/26/12   Ellouise Newer, PA  Alum & Mag Hydroxide-Simeth (MAGIC MOUTHWASH) SOLN Take 5 mLs by mouth 4 (four) times daily as needed. 02/19/12   Ellouise Newer, PA  dexamethasone (DECADRON) 4 MG tablet Take 8 mg by mouth. Starting the day after chemo, take 2 tablets = 8mg  daily x 2 days. Take with food.    Historical Provider, MD  fluconazole (DIFLUCAN) 100 MG tablet Take one tablet daily, PO until oral thrush resolves. 02/26/12   Ellouise Newer, PA  hyaluronate sodium (RADIAPLEXRX) GEL Apply topically 2 (two) times daily.    Historical Provider, MD  lidocaine-prilocaine (EMLA) cream Apply 1 application topically as needed. To access port    Historical Provider, MD  lisinopril (PRINIVIL,ZESTRIL) 40 MG tablet Take 40 mg by mouth every morning.     Historical Provider, MD  LORazepam (ATIVAN) 1 MG tablet Take 1 mg by mouth every 4 (four) hours as needed. Nausea/vomiting    Historical Provider, MD  magnesium oxide (MAG-OX) 400 (241.3 MG) MG tablet Take 400 mg by mouth as directed. Twice a day every other day 12/20/11 12/19/12  Randall An, MD  omeprazole (PRILOSEC) 20 MG capsule Take 1 capsule (20 mg total) by mouth daily. 02/26/12 02/25/13  Ellouise Newer, PA  ondansetron (ZOFRAN) 8 MG tablet Take by mouth. Starting the day after  chemo, take 1 tablet in am and 1 tablet in pm x 2 days. Then may take 1 tablet twice a day IF needed for nausea/vomiting.    Historical Provider, MD  potassium chloride 20 MEQ/15ML (10%) solution Take 20 mEq by mouth every other day.     Historical Provider, MD     Allergies:  No Known Allergies  Social History:   reports that he has never smoked. He has never used smokeless tobacco. He reports that he does not drink alcohol or use illicit drugs.  Family History: Family History  Problem Relation Age of Onset  . Hypertension Mother      Physical Exam: Filed Vitals:   03/28/12 2126 03/29/12 0032 03/29/12 0415 03/29/12 0442  BP: 126/82 124/72 120/70 125/78  Pulse: 116 107 93 75  Temp: 99.2 F (37.3 C) 99.5 F (37.5 C) 99.4 F (37.4 C) 99.3 F (37.4 C)  TempSrc: Oral Oral Oral Oral  Resp: 20 20 15 20   Height: 5\' 7"  (1.702 m)   5\' 7"  (1.702 m)  Weight: 66.679 kg (147 lb)   63 kg (138 lb 14.2 oz)  SpO2: 99% 95% 96% 95%   Blood pressure 125/78, pulse 75, temperature 99.3 F (37.4 C), temperature source Oral, resp. rate 20, height 5\' 7"  (1.702 m), weight 63 kg (138  lb 14.2 oz), SpO2 95.00%.  GEN:  Pleasant, but ill-looking, elderly Caucasian man lying in the stretcher; coughing frequently; bringing up a thick pale yellow sputum.; cooperative with exam PSYCH:  alert and oriented x4; does not appear anxious or depressed; affect is appropriate. HEENMT: Mucous membranes pink and anicteric; erythematous pharynx; white lesion right side of tongue; PERRLA; EOM intact; ; hypopigmented and erythematous mantle of skin neck and upper chest Breasts:: Not examined CHEST WALL: No tenderness; left upper chest Port-A-Cath CHEST: Normal respiration, harsh breath HEART: Regular rate and rhythm; no murmurs rubs or gallops BACK:  no CVA tenderness ABDOMEN: , soft non-tender; no masses, no organomegaly, normal abdominal bowel sounds; no pannus; no intertriginous candida. Rectal Exam: Not  done EXTREMITIES:  age-appropriate arthropathy of the hands and knees; trace edema left leg; no ulcerations. Genitalia: not examined PULSES: 2+ and symmetric SKIN: Normal hydration no rash or ulceration CNS: Cranial nerves 2-12 grossly intact no focal lateralizing neurologic deficit   Labs on Admission:  Basic Metabolic Panel:  Lab 03/29/12 1610  NA 133*  K 4.1  CL 95*  CO2 28  GLUCOSE 122*  BUN 31*  CREATININE 1.68*  CALCIUM 8.2*  MG --  PHOS --   Liver Function Tests:  Lab 03/29/12 0026  AST 52*  ALT 71*  ALKPHOS 70  BILITOT 0.4  PROT 6.4  ALBUMIN 2.3*   No results found for this basename: LIPASE:5,AMYLASE:5 in the last 168 hours No results found for this basename: AMMONIA:5 in the last 168 hours CBC:  Lab 03/29/12 0026  WBC 6.1  NEUTROABS 5.1  HGB 8.9*  HCT 25.8*  MCV 93.5  PLT 131*   Cardiac Enzymes: No results found for this basename: CKTOTAL:5,CKMB:5,CKMBINDEX:5,TROPONINI:5 in the last 168 hours BNP: No components found with this basename: POCBNP:5 D-dimer: No components found with this basename: D-DIMER:5 CBG: No results found for this basename: GLUCAP:5 in the last 168 hours  Results for orders placed during the hospital encounter of 03/28/12 (from the past 48 hour(s))  CBC WITH DIFFERENTIAL     Status: Abnormal   Collection Time   03/29/12 12:26 AM      Component Value Range Comment   WBC 6.1  4.0 - 10.5 K/uL    RBC 2.76 (*) 4.22 - 5.81 MIL/uL    Hemoglobin 8.9 (*) 13.0 - 17.0 g/dL    HCT 96.0 (*) 45.4 - 52.0 %    MCV 93.5  78.0 - 100.0 fL    MCH 32.2  26.0 - 34.0 pg    MCHC 34.5  30.0 - 36.0 g/dL    RDW 09.8  11.9 - 14.7 %    Platelets 131 (*) 150 - 400 K/uL    Neutrophils Relative 84 (*) 43 - 77 %    Lymphocytes Relative 8 (*) 12 - 46 %    Monocytes Relative 8  3 - 12 %    Eosinophils Relative 0  0 - 5 %    Basophils Relative 0  0 - 1 %    Neutro Abs 5.1  1.7 - 7.7 K/uL    Lymphs Abs 0.5 (*) 0.7 - 4.0 K/uL    Monocytes Absolute 0.5   0.1 - 1.0 K/uL    Eosinophils Absolute 0.0  0.0 - 0.7 K/uL    Basophils Absolute 0.0  0.0 - 0.1 K/uL    RBC Morphology STOMATOCYTES      WBC Morphology INCREASED BANDS (>20% BANDS)   TOXIC GRANULATION  COMPREHENSIVE METABOLIC PANEL  Status: Abnormal   Collection Time   03/29/12 12:26 AM      Component Value Range Comment   Sodium 133 (*) 135 - 145 mEq/L    Potassium 4.1  3.5 - 5.1 mEq/L    Chloride 95 (*) 96 - 112 mEq/L    CO2 28  19 - 32 mEq/L    Glucose, Bld 122 (*) 70 - 99 mg/dL    BUN 31 (*) 6 - 23 mg/dL    Creatinine, Ser 1.61 (*) 0.50 - 1.35 mg/dL    Calcium 8.2 (*) 8.4 - 10.5 mg/dL    Total Protein 6.4  6.0 - 8.3 g/dL    Albumin 2.3 (*) 3.5 - 5.2 g/dL    AST 52 (*) 0 - 37 U/L    ALT 71 (*) 0 - 53 U/L    Alkaline Phosphatase 70  39 - 117 U/L    Total Bilirubin 0.4  0.3 - 1.2 mg/dL    GFR calc non Af Amer 39 (*) >90 mL/min    GFR calc Af Amer 45 (*) >90 mL/min   URINALYSIS, ROUTINE W REFLEX MICROSCOPIC     Status: Normal   Collection Time   03/29/12  1:40 AM      Component Value Range Comment   Color, Urine YELLOW  YELLOW    APPearance CLEAR  CLEAR    Specific Gravity, Urine 1.015  1.005 - 1.030    pH 5.5  5.0 - 8.0    Glucose, UA NEGATIVE  NEGATIVE mg/dL    Hgb urine dipstick NEGATIVE  NEGATIVE    Bilirubin Urine NEGATIVE  NEGATIVE    Ketones, ur NEGATIVE  NEGATIVE mg/dL    Protein, ur NEGATIVE  NEGATIVE mg/dL    Urobilinogen, UA 0.2  0.0 - 1.0 mg/dL    Nitrite NEGATIVE  NEGATIVE    Leukocytes, UA NEGATIVE  NEGATIVE MICROSCOPIC NOT DONE ON URINES WITH NEGATIVE PROTEIN, BLOOD, LEUKOCYTES, NITRITE, OR GLUCOSE <1000 mg/dL.     Radiological Exams on Admission: Dg Chest Port 1 View  03/29/2012  *RADIOLOGY REPORT*  Clinical Data: Weakness, cough and fever.  History of squamous cell carcinoma of the tonsils status post recent chemotherapy and radiation therapy.  PORTABLE CHEST - 1 VIEW  Comparison: Portable chest 11/24/2011.  PET CT 11/23/2011.  Findings: Left subclavian  Port-A-Cath tip is unchanged at the level of the mid SVC.  The heart size and mediastinal contours are stable.  There are new patchy airspace opacities within the right upper and lower lobes.  The left lung is clear.  There is no pleural effusion.  IMPRESSION: New right lung air space opacities are nonspecific and may represent infection, aspiration or radiation change.  Correlate clinically.   Original Report Authenticated By: Gerrianne Scale, M.D.     Assessment/Plan Present on Admission:   .Healthcare-associated pneumonia, possibly aspiration  Admit for intravenous antibiotic therapy; favor Zosyn rather than cefepime because of possibility of aspiration; may benefit from swallowing evaluation .Acute renal failure  Intravenous fluid hydration; discontinue lisinopril for the time being; Keep n.p.o. Until swallowing evaluation.  Marland KitchenGERD (gastroesophageal reflux disease)  IV PPI  .Dysphagia, pharyngea .Pharyngitisl .Primary squamous cell carcinoma of tonsil, recently completed chemotherapy and radiation  Keep n.p.o., IV fluids, PEG tube feeds with the assistance of nutrition service; swallowing evaluation; Diflucan ; consult oncologist for assistance  .Hypertension   discontinue lisinopril for the time being; may need prn meds as hydration improves .Hypercholesterolemia  Will need to hold statin while n.p.o.  PLAN:   Other plans as per orders.  Code Status: full code to be confirmed. Family Communication: Wife at bedside throughout interview and exam. Disposition Plan: Discharged home and rehydrated and converted to less frequent antibiotics    Cheyann Blecha Nocturnist [facility] Pager [phone]   03/29/2012, 4:54 AM

## 2012-03-29 NOTE — Progress Notes (Addendum)
ANTIBIOTIC CONSULT NOTE - INITIAL  Pharmacy Consult for Vancomycin, Zosyn, and Levaquin Pt also on Acyclovir and Fluconazole  Indication: pneumonia  No Known Allergies  Patient Measurements: Height: 5\' 7"  (170.2 cm) Weight: 138 lb 14.2 oz (63 kg) IBW/kg (Calculated) : 66.1   Vital Signs: Temp: 99.3 F (37.4 C) (09/06 0442) Temp src: Oral (09/06 0442) BP: 125/78 mmHg (09/06 0442) Pulse Rate: 75  (09/06 0442) Intake/Output from previous day:   Intake/Output from this shift: Total I/O In: 10 [I.V.:10] Out: 150 [Urine:150]  Labs:  [facility] 03/29/12 0603 03/29/12 0026  WBC 4.6 6.1  HGB 8.2* 8.9*  PLT 139* 131*  LABCREA -- --  CREATININE 1.52* 1.68*   Estimated Creatinine Clearance: 38.6 ml/min (by C-G formula based on Cr of 1.52). No results found for this basename: VANCOTROUGH:2,VANCOPEAK:2,VANCORANDOM:2,GENTTROUGH:2,GENTPEAK:2,GENTRANDOM:2,TOBRATROUGH:2,TOBRAPEAK:2,TOBRARND:2,AMIKACINPEAK:2,AMIKACINTROU:2,AMIKACIN:2, in the last 72 hours   Microbiology: No results found for this or any previous visit (from the past 720 hour(s)).  Medical History: Past Medical History  Diagnosis Date  . High cholesterol   . Hearing deficit     Right greater than left.  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 Hypertension     EKG 5/13, chest CT 4/13 [facility], clearance with OV 01/08/12 oncology [facility]  . GERD (gastroesophageal reflux disease)   . 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 sodium chloride  250 mL Intravenous Once  . acetaminophen  650 mg Oral Once  . acyclovir  5 mg/kg Intravenous Q12H  . diphenhydrAMINE  25 mg Oral Once  . enoxaparin  40 mg Subcutaneous Q24H  . feeding supplement (JEVITY 1.2 CAL)  237 mL Per Tube QID  . fluconazole (DIFLUCAN) IV  100 mg Intravenous Q24H  . levofloxacin (LEVAQUIN) IV  500 mg Intravenous Q24H  . ondansetron  4 mg Intravenous Once  .  pantoprazole (PROTONIX) IV  40 mg Intravenous Q24H  . piperacillin-tazobactam (ZOSYN)  IV  3.375 g Intravenous Q8H  . sodium chloride  1,000 mL Intravenous Once  . sodium chloride  3 mL Intravenous Q12H  . vancomycin  1,000 mg Intravenous Once  . vancomycin  1,000 mg Intravenous Q24H  . DISCONTD: sodium chloride   Intravenous STAT  . DISCONTD: ceFEPime (MAXIPIME) IV  1 g Intravenous Q8H  . DISCONTD: ceFEPime (MAXIPIME) IV  1 g Intravenous Q24H  . DISCONTD: levofloxacin (LEVAQUIN) IV  750 mg Intravenous Q24H   Assessment: 71yo male with h/o tonsillar ca, worsening cough with sputum production.  Admitted for pna.  SCr elevated but improving.  Received Levaquin 750mg  upon admission as well as other ABX.  Was on Acyclovir and Fluconazole po as OP and resumed and converted to IV upon admission to hospital.   Estimated Creatinine Clearance: 38.6 ml/min (by C-G formula based on Cr of 1.52).  Goal of Therapy:  Vancomycin trough level 15-20 mcg/ml Eradicate infection.  Plan: Vancomycin 1gm iv q24hrs- Check trough at steady state Zosyn 3.375gm iv q8hrs levaquin 500mg  iv q24hrs (renally adjusted) Continue fluconazole and acyclovir IV Duration of ABX per MD Monitor labs, renal fxn, and cultures per protocol  Valrie Hart A 03/29/2012,10:12 AM

## 2012-03-29 NOTE — Progress Notes (Signed)
UR Chart Review Completed  

## 2012-03-29 NOTE — Progress Notes (Signed)
ANTIBIOTIC CONSULT NOTE-Preliminary  Pharmacy Consult for vancomycin Indication: pneumonia  No Known Allergies  Patient Measurements: Height: 5\' 7"  (170.2 cm) Weight: 147 lb (66.679 kg) IBW/kg (Calculated) : 66.1   Vital Signs: Temp: 99.5 F (37.5 C) (09/06 0032) Temp src: Oral (09/06 0032) BP: 124/72 mmHg (09/06 0032) Pulse Rate: 107  (09/06 0032)  Labs:  Basename 03/29/12 0026  WBC 6.1  HGB 8.9*  PLT 131*  LABCREA --  CREATININE 1.68*    Estimated Creatinine Clearance: 36.6 ml/min (by C-G formula based on Cr of 1.68).  Microbiology: No results found for this or any previous visit (from the past 720 hour(s)).  Medical History: Past Medical History  Diagnosis Date  . High cholesterol   . Hearing deficit     Right greater than left.  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 Hypertension     EKG 5/13, chest CT 4/13 EPIC, clearance with OV 01/08/12 oncology [facility]  . GERD (gastroesophageal reflux disease)   . 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 CXR and bandemia are suggestive of infection.  Goal of Therapy:  Vancomycin trough 15-20 mcg/ml  Plan:  Vancomycin 1g IV x 1  Preliminary review of pertinent patient information completed. Jeani Hawking clinical pharmacist will complete review during morning rounds to assess patient and finalize treatment regimen.  Floretta Petro [place], [place] 03/29/2012,3:52 AM

## 2012-03-29 NOTE — Progress Notes (Signed)
This man was admitted early this morning with right-sided pneumonia. According to his daughter, who is at the bedside, he has not been well in the last couple weeks and his intake has been rather poor in the last few days. She did not report any choking episodes with food. Also, at his baseline, he is mobile and independent but not so for the last few days, likely related to his pneumonia.  Plan: 1. Continue with antibiotics. 2. Swallowing evaluation. 3. Agree with blood transfusion. 4. He has already finished his chemotherapy and radiotherapy so I do not think oncology consultation at the present time would be of much use.

## 2012-03-29 NOTE — Progress Notes (Signed)
ANTIBIOTIC CONSULT NOTE-Preliminary  Pharmacy Consult for Zosyn Indication: pneumonia  No Known Allergies  Patient Measurements: Height: 5\' 7"  (170.2 cm) Weight: 138 lb 14.2 oz (63 kg) IBW/kg (Calculated) : 66.1   Vital Signs: Temp: 99.3 F (37.4 C) (09/06 0442) Temp src: Oral (09/06 0442) BP: 125/78 mmHg (09/06 0442) Pulse Rate: 75  (09/06 0442)  Labs:  Basename 03/29/12 0026  WBC 6.1  HGB 8.9*  PLT 131*  LABCREA --  CREATININE 1.68*    Estimated Creatinine Clearance: 34.9 ml/min (by C-G formula based on Cr of 1.68).  Microbiology: No cultures at this time  Medical History: Past Medical History  Diagnosis Date  . High cholesterol   . Hearing deficit     Right greater than left.  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 Hypertension     EKG 5/13, chest CT 4/13 EPIC, clearance with OV 01/08/12 oncology [facility]  . GERD (gastroesophageal reflux disease)   . 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 Additional anti-infectives are acyclovir, fluconazole, levofloxacin, and vancomycin.   Goal of Therapy:  Eradication of infection  Plan:  Zosyn 3.375g IV q8h infused over 4 hour  Preliminary review of pertinent patient information completed.   Jeani Hawking clinical pharmacist will complete review during morning rounds to assess patient and finalize treatment regimen.  Desiree Daise [place], PHARMD 03/29/2012,5:31 AM

## 2012-03-29 NOTE — Evaluation (Signed)
Clinical/Bedside Swallow Evaluation Patient Details  Name: Austin Mathis MRN: [id number] Date of Birth: 13-Jan-1941  Today's Date: 03/29/2012 Time: 1478-2956 SLP Time Calculation (min): 59 min  Past Medical History:  Past Medical History  Diagnosis Date  . High cholesterol   . Hearing deficit     Right greater than left.  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 Hypertension     EKG 5/13, chest CT 4/13 [facility], clearance with OV 01/08/12 oncology [facility]  . GERD (gastroesophageal reflux disease)   . 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 Portacath placement 11/24/2011    left side  . Multiple extractions with alveoloplasty 01/10/2012    Procedure: MULTIPLE EXTRACION WITH ALVEOLOPLASTY;  Surgeon: Charlynne Pander, DDS;  Location: [facility];  Service: Oral Surgery;  Laterality: N/A;  Extraction of tooth #'s 1,3,4,5,6,7,8,9,10,11,12,13,14,15,16,17,18,20,21,22, 23,24,25,26,27,28,29,30 with alveoloplasty.  . Esophagogastroduodenoscopy 01/16/2012    Procedure: ESOPHAGOGASTRODUODENOSCOPY (EGD);  Surgeon: West Bali, MD;  Location: [facility];  Service: Endoscopy;  Laterality: N/A;  9:30  . Peg placement 01/16/2012    Procedure: PERCUTANEOUS ENDOSCOPIC GASTROSTOMY (PEG) PLACEMENT;  Surgeon: West Bali, MD;  Location: [facility];  Service: Endoscopy;  Laterality: N/A;  Ancef one gram IV before procedure   HPI:  Austin Mathis is an 71 y.o. male.   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 Has been having progressive weakness and malaise, and in the past 24 hours was noted to have a temperature of 101. In the emergency room patient had a chest x-ray done which showed opacities in the right side of the chest compatible with radiation scars, aspiration or  pneumonia.    Assessment / Plan / Recommendation Clinical Impression  Mr. Calica is an active 71 yo male who just completed his course of chemo/rad for tonsillar cancer last week. His family reports that his appetite and po intake has dropped dramatically in the last few days. He currently has a PEG and has been receiving primary nutrition via PEG since yesterday due to discomfort with swallowing (and lack of taste per pt). Pt's discomfort is likely due to chemo/rad. Pt does not have a strong desire to take po by mouth currently, however recommend ordering a diet for comfort with primary source of nutrition via PEG until he starts to feel better. Will order D2/thin for comfort- Pt should swallow 2x for each bite/sip and take small sips. Discontinue po if pt coughing. Pt may benefit from out patient SLP follow up if he worsens and/or for long term recommendations/exercises due to effects of radiation changes over time.    Aspiration Risk  Mild    Diet Recommendation Dysphagia 2 (Fine chop);Thin liquid   Liquid Administration via: Cup;Straw (single sips only and swallow 2x) Medication Administration: Via alternative means Supervision: Patient able to self feed;Intermittent supervision to cue for compensatory strategies Compensations: Slow rate;Small sips/bites;Multiple dry swallows after each bite/sip Postural Changes and/or Swallow Maneuvers: Seated upright 90 degrees;Upright 30-60 min after meal    Other  Recommendations Oral Care Recommendations: Oral care QID Other Recommendations: Clarify dietary restrictions   Follow Up Recommendations  Outpatient SLP    Frequency and Duration min 2x/week  1 week      SLP Swallow Goals Patient will consume recommended diet  without observed clinical signs of aspiration with: Minimal cueing Patient will utilize recommended strategies during swallow to increase swallowing safety with: Minimal cueing   Swallow Study Prior Functional Status   Lives at  home with wife and son    General Date of Onset: 03/24/12 HPI: Austin Mathis is an 71 y.o. male.   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 Has been having progressive weakness and malaise, and in the past 24 hours was noted to have a temperature of 101. In the emergency room patient had a chest x-ray done which showed opacities in the right side of the chest compatible with radiation scars, aspiration or pneumonia.  Type of Study: Bedside swallow evaluation Previous Swallow Assessment: None on record Diet Prior to this Study: NPO Temperature Spikes Noted: Yes Respiratory Status: Room air History of Recent Intubation: No Behavior/Cognition: Alert;Cooperative;Hard of hearing Oral Cavity - Dentition: Edentulous Self-Feeding Abilities: Able to feed self Patient Positioning: Upright in bed Baseline Vocal Quality: Breathy;Aphonic Volitional Cough: Strong Volitional Swallow: Able to elicit    Oral/Motor/Sensory Function Overall Oral Motor/Sensory Function: Appears within functional limits for tasks assessed   Ice Chips Ice chips: Within functional limits Presentation: Spoon   Thin Liquid Thin Liquid: Impaired Presentation: Straw;Cup;Self Fed Pharyngeal  Phase Impairments: Cough - Delayed    Nectar Thick Nectar Thick Liquid: Impaired Presentation: Self Fed;Cup Pharyngeal Phase Impairments: Cough - Delayed   Honey Thick Honey Thick Liquid: Not tested   Puree Puree: Within functional limits Other Comments: somewhat audible swallow   Solid    Thank you,  Havery Moros, CCC-SLP [phone]  Solid: Impaired Presentation: Spoon Other Comments: limited trials and pt with facial grimmace with swallow       PORTER,DABNEY 03/29/2012,2:08 PM

## 2012-03-30 DIAGNOSIS — Z931 Gastrostomy status: Secondary | ICD-10-CM

## 2012-03-30 DIAGNOSIS — K219 Gastro-esophageal reflux disease without esophagitis: Secondary | ICD-10-CM

## 2012-03-30 LAB — CBC
MCHC: 33.8 g/dL (ref 30.0–36.0)
Platelets: 168 10*3/uL (ref 150–400)
RDW: 18.3 % — ABNORMAL HIGH (ref 11.5–15.5)
WBC: 4.7 10*3/uL (ref 4.0–10.5)

## 2012-03-30 LAB — TYPE AND SCREEN
ABO/RH(D): A NEG
Unit division: 0

## 2012-03-30 LAB — COMPREHENSIVE METABOLIC PANEL
Albumin: 2 g/dL — ABNORMAL LOW (ref 3.5–5.2)
BUN: 22 mg/dL (ref 6–23)
Chloride: 99 mEq/L (ref 96–112)
Creatinine, Ser: 1.45 mg/dL — ABNORMAL HIGH (ref 0.50–1.35)
GFR calc Af Amer: 54 mL/min — ABNORMAL LOW (ref >90)
GFR calc non Af Amer: 46 mL/min — ABNORMAL LOW (ref >90)
Total Bilirubin: 0.4 mg/dL (ref 0.3–1.2)

## 2012-03-30 LAB — LEGIONELLA ANTIGEN, URINE

## 2012-03-30 LAB — EXPECTORATED SPUTUM ASSESSMENT W GRAM STAIN, RFLX TO RESP C

## 2012-03-30 MED ORDER — PANTOPRAZOLE SODIUM 40 MG IV SOLR
40.0000 mg | Freq: Two times a day (BID) | INTRAVENOUS | Status: DC
  Administered 2012-03-30 – 2012-03-31 (×2): 40 mg via INTRAVENOUS
  Filled 2012-03-30 (×2): qty 40

## 2012-03-30 MED ORDER — FLUCONAZOLE 100 MG PO TABS
100.0000 mg | ORAL_TABLET | Freq: Every day | ORAL | Status: DC
  Administered 2012-03-30 – 2012-03-31 (×2): 100 mg via ORAL
  Filled 2012-03-30 (×2): qty 1

## 2012-03-30 MED ORDER — SUCRALFATE 1 GM/10ML PO SUSP
1.0000 g | Freq: Three times a day (TID) | ORAL | Status: DC
  Administered 2012-03-30 – 2012-03-31 (×5): 1 g via ORAL
  Filled 2012-03-30 (×5): qty 10

## 2012-03-30 MED ORDER — SODIUM CHLORIDE 0.9 % IJ SOLN
INTRAMUSCULAR | Status: AC
  Administered 2012-03-30: 10 mL
  Filled 2012-03-30: qty 3

## 2012-03-30 NOTE — Progress Notes (Signed)
Telemetry removed per order. Patient in stable condition. Will continue to monitor.

## 2012-03-30 NOTE — Progress Notes (Signed)
     Subjective: This man says he is nauseous. He feels he is less energy. He is frustrated that he is coughing up purulent sputum. I told him this is actually a good thing.           Physical Exam: Blood pressure 129/86, pulse 105, temperature 98.9 F (37.2 C), temperature source Oral, resp. rate 20, height 5\' 7"  (1.702 m), weight 64.5 kg (142 lb 3.2 oz), SpO2 96.00%. He does look systemically well and is not toxic or septic. There is no increased work of breathing. Lung fields now sound clinically clear. Heart sounds are present and normal without murmurs. He is alert and orientated.   Investigations:     Basic Metabolic Panel:  Basename 03/30/12 0459 03/29/12 0603 03/29/12 0026  NA 137 -- 133*  K 3.5 -- 4.1  CL 99 -- 95*  CO2 29 -- 28  GLUCOSE 98 -- 122*  BUN 22 -- 31*  CREATININE 1.45* 1.52* --  CALCIUM 7.6* -- 8.2*  MG -- -- --  PHOS -- -- --   Liver Function Tests:  [facility] 03/30/12 0459 03/29/12 0026  AST 25 52*  ALT 45 71*  ALKPHOS 61 70  BILITOT 0.4 0.4  PROT 5.9* 6.4  ALBUMIN 2.0* 2.3*     CBC:  Basename 03/30/12 0459 03/29/12 0603 03/29/12 0026  WBC 4.7 4.6 --  NEUTROABS -- -- 5.1  HGB 8.9* 8.2* --  HCT 26.3* 24.0* --  MCV 90.7 93.8 --  PLT 168 139* --    Dg Chest Port 1 View  03/29/2012  *RADIOLOGY REPORT*  Clinical Data: Weakness, cough and fever.  History of squamous cell carcinoma of the tonsils status post recent chemotherapy and radiation therapy.  PORTABLE CHEST - 1 VIEW  Comparison: Portable chest 11/24/2011.  PET CT 11/23/2011.  Findings: Left subclavian Port-A-Cath tip is unchanged at the level of the mid SVC.  The heart size and mediastinal contours are stable.  There are new patchy airspace opacities within the right upper and lower lobes.  The left lung is clear.  There is no pleural effusion.  IMPRESSION: New right lung air space opacities are nonspecific and may represent infection, aspiration or radiation change.  Correlate  clinically.   Original Report Authenticated By: Gerrianne Scale, M.D.       Medications: I have reviewed the patient's current medications.  Impression: 1. Health care associated pneumonia, positive for strep pneumoniae urinary antigen. 2. Squamous cell carcinoma of the tonsil, status post chemotherapy and radiotherapy. 3. Status post PEG tube. 4. Acute renal failure, somewhat improved. 5. GERD     Plan: 1. Continue with IV fluids. 2. In view of this urinary antigen strep pneumonia positivity, we will discontinue IV vancomycin but continued for the time being with IV Levaquin and Zosyn. Acyclovir can be discontinued. Diflucan should be continued for the time being orally. 2. Increase Protonix IV twice a day. 4. Start Carafate. 5. Patient did mobilize. 6. I think he could probably be discharged in next 24-48 hours  depending on progress.     LOS: 2 days   Wilson Singer Pager [phone]  03/30/2012, 8:21 AM

## 2012-03-31 DIAGNOSIS — J029 Acute pharyngitis, unspecified: Secondary | ICD-10-CM

## 2012-03-31 DIAGNOSIS — I10 Essential (primary) hypertension: Secondary | ICD-10-CM

## 2012-03-31 DIAGNOSIS — J13 Pneumonia due to Streptococcus pneumoniae: Principal | ICD-10-CM

## 2012-03-31 LAB — BASIC METABOLIC PANEL
Calcium: 7.2 mg/dL — ABNORMAL LOW (ref 8.4–10.5)
Creatinine, Ser: 1.39 mg/dL — ABNORMAL HIGH (ref 0.50–1.35)
GFR calc non Af Amer: 49 mL/min — ABNORMAL LOW (ref >90)
Glucose, Bld: 80 mg/dL (ref 70–99)
Sodium: 138 mEq/L (ref 135–145)

## 2012-03-31 LAB — CBC
MCH: 30.5 pg (ref 26.0–34.0)
MCHC: 33.3 g/dL (ref 30.0–36.0)
MCV: 91.5 fL (ref 78.0–100.0)
Platelets: 168 10*3/uL (ref 150–400)

## 2012-03-31 MED ORDER — ONDANSETRON HCL 4 MG PO TABS: 4.0000 mg | ORAL_TABLET | Freq: Three times a day (TID) | ORAL | Status: AC | PRN

## 2012-03-31 MED ORDER — MAGIC MOUTHWASH W/LIDOCAINE: 10.0000 mL | Freq: Three times a day (TID) | ORAL | Status: DC

## 2012-03-31 MED ORDER — AMOXICILLIN-POT CLAVULANATE 875-125 MG PO TABS: 1.0000 | ORAL_TABLET | Freq: Two times a day (BID) | ORAL | Status: AC

## 2012-03-31 MED ORDER — HEPARIN SOD (PORK) LOCK FLUSH 100 UNIT/ML IV SOLN
500.0000 [IU] | INTRAVENOUS | Status: AC | PRN
  Administered 2012-03-31: 500 [IU]
  Filled 2012-03-31: qty 5

## 2012-03-31 MED ORDER — SUCRALFATE 1 GM/10ML PO SUSP: 1.0000 g | Freq: Three times a day (TID) | ORAL | Status: DC

## 2012-03-31 MED ORDER — OMEPRAZOLE 20 MG PO CPDR: 20.0000 mg | DELAYED_RELEASE_CAPSULE | Freq: Two times a day (BID) | ORAL | Status: DC

## 2012-03-31 MED ORDER — ENSURE CLINICAL ST REVIGOR PO LIQD: 237.0000 mL | Freq: Three times a day (TID) | ORAL | Status: DC

## 2012-03-31 NOTE — Progress Notes (Signed)
Discharge instructions reviewed with patient and patient's wife, both voiced understanding. Patient given discharge instructions and prescriptions. Patient in stable condition.

## 2012-03-31 NOTE — Progress Notes (Signed)
Patient is in stable condition and transported out by tech.

## 2012-03-31 NOTE — Progress Notes (Signed)
Faxed over home health order and facesheet to [facility]. Called and verified [facility] received fax.

## 2012-03-31 NOTE — Progress Notes (Signed)
ANTIBIOTIC CONSULT NOTE  Pharmacy Consult for Zosyn, and Levaquin Pt also on Fluconazole  Indication: pneumonia  No Known Allergies  Patient Measurements: Height: 5\' 7"  (170.2 cm) Weight: 141 lb 1.5 oz (64 kg) IBW/kg (Calculated) : 66.1   Vital Signs: Temp: 98.2 F (36.8 C) (09/08 0616) Temp src: Oral (09/08 0616) BP: 128/77 mmHg (09/08 0616) Pulse Rate: 86  (09/08 0616) Intake/Output from previous day: 09/07 0701 - 09/08 0700 In: 15 [P.O.:10; I.V.:5] Out: 950 [Urine:950] Intake/Output from this shift: Total I/O In: 350 [P.O.:350] Out: 100 [Urine:100]  Labs:  Basename 03/31/12 0436 03/30/12 0459 03/29/12 0603  WBC 4.1 4.7 4.6  HGB 9.0* 8.9* 8.2*  PLT 168 168 139*  LABCREA -- -- --  CREATININE 1.39* 1.45* 1.52*   Estimated Creatinine Clearance: 42.8 ml/min (by C-G formula based on Cr of 1.39). No results found for this basename: VANCOTROUGH:2,VANCOPEAK:2,VANCORANDOM:2,GENTTROUGH:2,GENTPEAK:2,GENTRANDOM:2,TOBRATROUGH:2,TOBRAPEAK:2,TOBRARND:2,AMIKACINPEAK:2,AMIKACINTROU:2,AMIKACIN:2, in the last 72 hours   Microbiology: Recent Results (from the past 720 hour(s))  CULTURE, EXPECTORATED SPUTUM-ASSESSMENT     Status: Normal   Collection Time   03/30/12  4:03 AM      Component Value Range Status Comment   Specimen Description SPUTUM   Final    Special Requests Immunocompromised   Final    Sputum evaluation     Final    Value: THIS SPECIMEN IS ACCEPTABLE. RESPIRATORY CULTURE REPORT TO FOLLOW.   Report Status 03/30/2012 FINAL   Final     Medical History: Past Medical History  Diagnosis Date  . High cholesterol   . Hearing deficit     Right greater than left.  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 Hypertension     EKG 5/13, chest CT 4/13 EPIC, clearance with OV 01/08/12 oncology [facility]  . GERD (gastroesophageal reflux disease)   . 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 enoxaparin  40 mg Subcutaneous Q24H  . feeding supplement (OSMOLITE 1.5 CAL)  237 mL Per Tube QID  . fluconazole  100 mg Oral Daily  . levofloxacin (LEVAQUIN) IV  500 mg Intravenous Q24H  . pantoprazole (PROTONIX) IV  40 mg Intravenous Q12H  . piperacillin-tazobactam (ZOSYN)  IV  3.375 g Intravenous Q8H  . sodium chloride  3 mL Intravenous Q12H  . sucralfate  1 g Oral TID WC & HS   Assessment: 71yo male with h/o tonsillar cancer being treated for health care associated pneumonia, positive for strep pneumoniae urinary antigen.   Estimated Creatinine Clearance: 42.8 ml/min (by C-G formula based on Cr of 1.39).  Goal of Therapy:  Eradicate infection.  Plan: Zosyn 3.375gm iv q8hrs Levaquin 500mg  iv q24hrs. Continue fluconazole. Duration of ABX per MD Continue to monitor labs, renal fxn, and cultures per protocol  Mady Gemma 03/31/2012,8:48 AM

## 2012-03-31 NOTE — Discharge Summary (Addendum)
Physician Discharge Summary  Austin Mathis WUJ:[id number] DOB: 03/30/1941 DOA: 03/28/2012  PCP: [name], MD  Admit date: 03/28/2012 Discharge date: 03/31/2012  Recommendations for Outpatient Follow-up:  1. Followup with primary care physician in 2 weeks. 2. Followup with oncology as scheduled 3. Patient will be set up with home health RN  Discharge Diagnoses:  Principal Problem:  *Pneumococcal pneumonia Active Problems:  Primary squamous cell carcinoma of tonsil  Hypertension  Hypercholesterolemia  GERD (gastroesophageal reflux disease)  Dysphagia, pharyngeal  Gastrostomy tube dependent  Acute renal failure  Pharyngitis Severe protein calorie malnutrition  Discharge Condition: Improved  Diet recommendation: Pure diet with thin liquids for comfort  Filed Weights   03/30/12 0404 03/30/12 0500 03/31/12 0616  Weight: 65.1 kg (143 lb 8.3 oz) 64.5 kg (142 lb 3.2 oz) 64 kg (141 lb 1.5 oz)    History of present illness:  Austin Mathis is an 71 y.o. male. 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 Has been having progressive weakness and malaise, and in the past 24 hours was noted to have a temperature of 101. In the emergency room patient had a chest x-ray done which showed opacities in the right side of the chest compatible with radiation scars, aspiration or pneumonia. Hospitalist service was called to assist with management.  Patient does have a chronic cough which has not been getting worse, productive of thick pale yellow sputum. He produces copious quantities of saliva which she has difficulty swallowing. He has a gastrostomy tube to supplement his oral feeds, and recently he's been able to tolerate less and less oral because of progressive dysphagia.  Takes 3 cans of Ensure by PEG daily, and sometimes 2 milk shakes  Denies shortness of breath or chest pain. Has not  been taking any antibiotics.  Review of his labs show a progressive rise in serum creatinine from 1.18 over the past month.    Hospital Course:  This gentleman was admitted to the hospital with shortness of breath and progressive weakness. He was found to be febrile and was diagnosed with a pneumonia. He was started on empiric antibiotics with vancomycin and Zosyn. Initially the thought was he may have developed an aspiration pneumonia. Urinary antigens returned positive for pneumococcus. Since his admission patient has remained afebrile. Clinically he has improved. He does not appear toxic. His cough has also improved as has his respiratory status. He'll be transitioned to oral Augmentin. The patient also has significant decline in by mouth intake. He recently completed a course of radiation for his tonsillar malignancy. His difficulty with swallowing is likely related to a radiation esophagitis/pharyngitis. He reports some improvement with Magic mouthwash. This will be provided at home as well. He will also continue on Protonix and Carafate. He was seen by speech therapy who recommended a pured diet with thin liquids for comfort. This can be advanced as the patient's swallowing improved. Since his admission, his swallowing has improved and he is currently tolerating oatmeal and other pured foods. He is due to followup with oncology as an outpatient. His dehydration has improved with IV fluids and his renal function has returned to baseline. He did have an increase in his creatinine on admission. His ACE inhibitor was held and we will continue to hold this as he is normotensive. This can be readdressed in the outpatient setting. He will continue on oral fluids/fluids via PEG. He is otherwise stable for discharge home  Procedures:  None  Consultations:  None  Discharge Exam: Filed Vitals:   03/30/12 1423 03/30/12 2032 03/31/12 0616 03/31/12 1032  BP: 122/75 118/76 128/77 116/71  Pulse: 88 81 86  92  Temp: 99.5 F (37.5 C) 98.2 F (36.8 C) 98.2 F (36.8 C) 99.1 F (37.3 C)  TempSrc: Oral Oral Oral Oral  Resp: 18 16 18 20   Height:      Weight:   64 kg (141 lb 1.5 oz)   SpO2: 98% 96% 96% 97%    General: No acute distress Cardiovascular: S1, S2, regular rate and rhythm, no pedal edema Respiratory: Clear to auscultation bilaterally  Discharge Instructions  Discharge Orders    Future Appointments: Provider: Department: Dept Phone: Center:   04/15/2012 12:20 PM [facility] [phone] None   04/15/2012 1:00 PM Randall An, MD [facility] [phone] None   04/22/2012 8:50 AM Billie Lade, MD [facility] [phone] None   04/30/2012 2:30 PM [facility] [phone] None     Future Orders Please Complete By Expires   Diet general      Home Health      Questions: Responses:   To provide the following care/treatments RN   Face-to-face encounter      Comments:   I Demaryius Imran certify that this patient is under my care and that I, or a nurse practitioner or physician's assistant working with me, had a face-to-face encounter that meets the physician face-to-face encounter requirements with this patient on 03/31/2012.   Questions: Responses:   The encounter with the patient was in whole, or in part, for the following medical condition, which is the primary reason for home health care dehydration, tonsillar cancer   I certify that, based on my findings, the following services are medically necessary home health services Nursing   My clinical findings support the need for the above services High Risk for rehospitalization   Further, I certify that my clinical findings support that this patient is homebound due to: Unable to leave home safely without assistance   To provide the following care/treatments RN   Increase activity slowly      Call MD for:  temperature >100.4      Call MD for:  persistant nausea and vomiting       Call MD for:  severe uncontrolled pain        Medication List  As of 03/31/2012 11:41 AM   STOP taking these medications         fosinopril 40 MG tablet         TAKE these medications         amoxicillin-clavulanate 875-125 MG per tablet   Commonly known as: AUGMENTIN   Take 1 tablet by mouth 2 (two) times daily.      feeding supplement Liqd   Take 237 mLs by mouth 3 (three) times daily with meals.      hyaluronate sodium Gel   Apply topically 2 (two) times daily.      lidocaine-prilocaine cream   Commonly known as: EMLA   Apply 1 application topically as needed. To access port      LORazepam 1 MG tablet   Commonly known as: ATIVAN   Take 1 mg by mouth every 4 (four) hours as needed. Nausea/vomiting      magic mouthwash w/lidocaine Soln   Take 10 mLs by mouth 3 (three) times daily.      omeprazole 20 MG capsule  Commonly known as: PRILOSEC   Take 1 capsule (20 mg total) by mouth 2 (two) times daily.      ondansetron 4 MG tablet   Commonly known as: ZOFRAN   Take 1 tablet (4 mg total) by mouth every 8 (eight) hours as needed for nausea.      potassium chloride 20 MEQ/15ML (10%) solution   Take 20 mEq by mouth every other day.      pravastatin 40 MG tablet   Commonly known as: PRAVACHOL   Take 40 mg by mouth at bedtime.      sucralfate 1 GM/10ML suspension   Commonly known as: CARAFATE   Take 10 mLs (1 g total) by mouth 4 (four) times daily -  with meals and at bedtime.      verapamil 240 MG (CO) 24 hr tablet   Commonly known as: COVERA HS   Take 240 mg by mouth at bedtime.           Follow-up Information    Follow up with LUKING,SCOTT, MD. Schedule an appointment as soon as possible for a visit in 2 weeks.   Contact information:   [address] [place] [address] [phone]       Follow up with Randall An, MD. (as scheduled)    Contact information:   [address] [address] [phone]            The results of significant diagnostics from this hospitalization (including imaging, microbiology, ancillary and laboratory) are listed below for reference.    Significant Diagnostic Studies: Dg Chest Port 1 View  03/29/2012  *RADIOLOGY REPORT*  Clinical Data: Weakness, cough and fever.  History of squamous cell carcinoma of the tonsils status post recent chemotherapy and radiation therapy.  PORTABLE CHEST - 1 VIEW  Comparison: Portable chest 11/24/2011.  PET CT 11/23/2011.  Findings: Left subclavian Port-A-Cath tip is unchanged at the level of the mid SVC.  The heart size and mediastinal contours are stable.  There are new patchy airspace opacities within the right upper and lower lobes.  The left lung is clear.  There is no pleural effusion.  IMPRESSION: New right lung air space opacities are nonspecific and may represent infection, aspiration or radiation change.  Correlate clinically.   Original Report Authenticated By: Gerrianne Scale, M.D.     Microbiology: Recent Results (from the past 240 hour(s))  CULTURE, EXPECTORATED SPUTUM-ASSESSMENT     Status: Normal   Collection Time   03/30/12  4:03 AM      Component Value Range Status Comment   Specimen Description SPUTUM   Final    Special Requests Immunocompromised   Final    Sputum evaluation     Final    Value: THIS SPECIMEN IS ACCEPTABLE. RESPIRATORY CULTURE REPORT TO FOLLOW.   Report Status 03/30/2012 FINAL   Final   CULTURE, RESPIRATORY     Status: Normal (Preliminary result)   Collection Time   03/30/12  4:03 AM      Component Value Range Status Comment   Specimen Description SPU   Final    Special Requests NONE   Final    Gram Stain PENDING   Incomplete    Culture NO GROWTH   Final    Report Status PENDING   Incomplete      Labs: Basic Metabolic Panel:  Lab 03/31/12 2841 03/30/12 0459 03/29/12 0603 03/29/12 0026  NA 138 137 -- 133*  K 3.5 3.5 --  4.1  CL 101 99 -- 95*  CO2 26 29 -- 28  GLUCOSE 80 98 -- 122*  BUN 17  22 -- 31*  CREATININE 1.39* 1.45* 1.52* 1.68*  CALCIUM 7.2* 7.6* -- 8.2*  MG -- -- -- --  PHOS -- -- -- --   Liver Function Tests:  Lab 03/30/12 0459 03/29/12 0026  AST 25 52*  ALT 45 71*  ALKPHOS 61 70  BILITOT 0.4 0.4  PROT 5.9* 6.4  ALBUMIN 2.0* 2.3*   No results found for this basename: LIPASE:5,AMYLASE:5 in the last 168 hours No results found for this basename: AMMONIA:5 in the last 168 hours CBC:  Lab 03/31/12 0436 03/30/12 0459 03/29/12 0603 03/29/12 0026  WBC 4.1 4.7 4.6 6.1  NEUTROABS -- -- -- 5.1  HGB 9.0* 8.9* 8.2* 8.9*  HCT 27.0* 26.3* 24.0* 25.8*  MCV 91.5 90.7 93.8 93.5  PLT 168 168 139* 131*   Cardiac Enzymes: No results found for this basename: CKTOTAL:5,CKMB:5,CKMBINDEX:5,TROPONINI:5 in the last 168 hours BNP: BNP (last 3 results)  Basename 03/29/12 0603  PROBNP 141.6*   CBG: No results found for this basename: GLUCAP:5 in the last 168 hours  Time coordinating discharge: greater than 30 minutes  Signed:  Ival Pacer  [facility] 03/31/2012, 11:41 AM

## 2012-04-02 LAB — CULTURE, RESPIRATORY W GRAM STAIN: Culture: NO GROWTH

## 2012-04-15 ENCOUNTER — Encounter (HOSPITAL_COMMUNITY): Payer: Federal, State, Local not specified - PPO | Admitting: Oncology

## 2012-04-15 ENCOUNTER — Encounter (HOSPITAL_COMMUNITY): Payer: Federal, State, Local not specified - PPO

## 2012-04-15 ENCOUNTER — Ambulatory Visit (HOSPITAL_COMMUNITY)
Admission: RE | Admit: 2012-04-15 | Discharge: 2012-04-15 | Disposition: A | Discharge status: Home / Self Care | Payer: Federal, State, Local not specified - PPO | Source: Ambulatory Visit | Attending: Oncology | Admitting: Oncology

## 2012-04-15 ENCOUNTER — Encounter (HOSPITAL_COMMUNITY): Payer: Federal, State, Local not specified - PPO | Attending: Oncology | Admitting: Oncology

## 2012-04-15 VITALS — BP 144/85 | HR 97 | Temp 98.7°F | Resp 16 | Wt 137.3 lb

## 2012-04-15 DIAGNOSIS — J189 Pneumonia, unspecified organism: Secondary | ICD-10-CM | POA: Insufficient documentation

## 2012-04-15 DIAGNOSIS — J690 Pneumonitis due to inhalation of food and vomit: Secondary | ICD-10-CM

## 2012-04-15 DIAGNOSIS — C099 Malignant neoplasm of tonsil, unspecified: Secondary | ICD-10-CM

## 2012-04-15 DIAGNOSIS — C770 Secondary and unspecified malignant neoplasm of lymph nodes of head, face and neck: Secondary | ICD-10-CM

## 2012-04-15 DIAGNOSIS — E46 Unspecified protein-calorie malnutrition: Secondary | ICD-10-CM

## 2012-04-15 LAB — CBC WITH DIFFERENTIAL/PLATELET
Basophils Absolute: 0 10*3/uL (ref 0.0–0.1)
Basophils Relative: 0 % (ref 0–1)
Eosinophils Absolute: 0 10*3/uL (ref 0.0–0.7)
HCT: 30.3 % — ABNORMAL LOW (ref 39.0–52.0)
Hemoglobin: 10.1 g/dL — ABNORMAL LOW (ref 13.0–17.0)
Lymphs Abs: 1.6 10*3/uL (ref 0.7–4.0)
MCHC: 33.3 g/dL (ref 30.0–36.0)
MCV: 91 fL (ref 78.0–100.0)
Monocytes Relative: 17 % — ABNORMAL HIGH (ref 3–12)
Neutro Abs: 6 10*3/uL (ref 1.7–7.7)
RDW: 16.4 % — ABNORMAL HIGH (ref 11.5–15.5)

## 2012-04-15 LAB — COMPREHENSIVE METABOLIC PANEL
ALT: 15 U/L (ref 0–53)
AST: 16 U/L (ref 0–37)
CO2: 29 mEq/L (ref 19–32)
Calcium: 9.6 mg/dL (ref 8.4–10.5)
Chloride: 94 mEq/L — ABNORMAL LOW (ref 96–112)
GFR calc Af Amer: 64 mL/min — ABNORMAL LOW (ref >90)
GFR calc non Af Amer: 55 mL/min — ABNORMAL LOW (ref >90)
Glucose, Bld: 108 mg/dL — ABNORMAL HIGH (ref 70–99)
Sodium: 134 mEq/L — ABNORMAL LOW (ref 135–145)
Total Bilirubin: 0.2 mg/dL — ABNORMAL LOW (ref 0.3–1.2)

## 2012-04-15 NOTE — Patient Instructions (Addendum)
[facility]  Discharge Instructions  RECOMMENDATIONS MADE BY THE CONSULTANT AND ANY TEST RESULTS WILL BE SENT TO YOUR REFERRING DOCTOR.   EXAM FINDINGS BY MD TODAY AND SIGNS AND SYMPTOMS TO REPORT TO CLINIC OR PRIMARY MD: exam and discussion by MD.  Will get chest xray before you leave today.  We also checked some labs today as well.  MD wants you to take 4 bottles of ensure daily for 1 week then 5 bottles daily.  MEDICATIONS PRESCRIBED: none   INSTRUCTIONS GIVEN AND DISCUSSED: Other :  Chest xray today, PET scan in November.  Nothing to eat or drink 6 hours prior to the scan.  Nothing with sugar, no gum or candy.  SPECIAL INSTRUCTIONS/FOLLOW-UP: Lab work Needed today, Xray Studies Needed today and in November.  We will call you with the date and time of the swallow study. Return to Clinic in 3 - 4 weeks to see PA.   I acknowledge that I have been informed and understand all the instructions given to me and received a copy. I do not have any more questions at this time, but understand that I may call the Specialty Clinic at [facility] at [phone] during business hours should I have any further questions or need assistance in obtaining follow-up care.    __________________________________________  _____________  __________ Signature of Patient or Authorized Representative            Date                   Time    __________________________________________ Nurse's Signature

## 2012-04-15 NOTE — Patient Instructions (Addendum)
[facility]  Discharge Instructions  RECOMMENDATIONS MADE BY THE CONSULTANT AND ANY TEST RESULTS WILL BE SENT TO YOUR REFERRING DOCTOR.   EXAM FINDINGS BY MD TODAY AND SIGNS AND SYMPTOMS TO REPORT TO CLINIC OR PRIMARY MD: Exam and discussion per MD. MD wants you to take Ensure 4 bottles daily for 1 week then 5 bottles daily.  We will get our financial counselor to investigate getting ensure covered by your insurance.  MEDICATIONS PRESCRIBED: none   INSTRUCTIONS GIVEN AND DISCUSSED: Other :  Will check some blood work today and a chest xray.  SPECIAL INSTRUCTIONS/FOLLOW-UP: Xray Studies Needed :  Swallowing study , PET scan in November and Return to Clinic in 3 - 4 weeks to see PA.   I acknowledge that I have been informed and understand all the instructions given to me and received a copy. I do not have any more questions at this time, but understand that I may call the Specialty Clinic at [facility] at [phone] during business hours should I have any further questions or need assistance in obtaining follow-up care.    __________________________________________  _____________  __________ Signature of Patient or Authorized Representative            Date                   Time    __________________________________________ Nurse's Signature

## 2012-04-15 NOTE — Progress Notes (Signed)
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  Problem #2 malnutrition significant weight loss Problem #3 recent pneumonia and I'm worried about aspiration.   He is here today with his wife and daughter and is very weak. He is drooling quite excessively. He is using 2-3 boxes the clinic cells per day. He does not have a good appetite. He issues only 3 bottles of Ensure through his PEG tube per day. We're going to increase that to 4 bottles per day for a week and then increase to 5 bottles per day thereafter. He states his bowels are working well and his urine flow is fine. His wife does state that he chokes at times on food. He was admitted to the hospital earlier this month with pneumonia. He is still bringing up some mildly discolored phlegm. It is basically clear however. He has no lymphadenopathy in his neck however in the cervical, supraclavicular, or infraclavicular or axillary areas. His lungs are very clear but hyperresonant to percussion. He is very thin in appearance he looks very weak overall. Heart shows a regular rhythm and rate without murmur rub or gallop. He has no ankle edema. His Port-A-Cath looks intact.  The biggest issue with Tracer at this time is his nutrition. I want to check a chest x-ray, some blood work, and get a swallowing study. I am not sure he is not aspirating still. He will get a PET scan in early December or late February. I want him to return here in 3-4 weeks.

## 2012-04-15 NOTE — Addendum Note (Signed)
Addended by: Evelena Leyden on: 04/15/2012 06:46 PM   Modules accepted: Orders

## 2012-04-16 ENCOUNTER — Telehealth (HOSPITAL_COMMUNITY): Payer: Self-pay | Admitting: Oncology

## 2012-04-16 NOTE — Progress Notes (Signed)
Note already dictated for 04/15/12

## 2012-04-16 NOTE — Telephone Encounter (Signed)
[employer]ED [phone] ?'D IF ENSURE WOULD BE COVERED UNDER PTS PLAN SINCE PT HAS A FEEDING TUBE. CSR- Tasha J Ensure would not be a covered expense under pts plan. Ref# [id number]

## 2012-04-22 ENCOUNTER — Encounter: Payer: Self-pay | Admitting: Radiation Oncology

## 2012-04-22 ENCOUNTER — Ambulatory Visit
Admission: RE | Admit: 2012-04-22 | Discharge: 2012-04-22 | Disposition: A | Discharge status: Home / Self Care | Payer: Federal, State, Local not specified - PPO | Source: Ambulatory Visit | Attending: Radiation Oncology | Admitting: Radiation Oncology

## 2012-04-22 ENCOUNTER — Ambulatory Visit (HOSPITAL_COMMUNITY)
Admission: RE | Admit: 2012-04-22 | Discharge: 2012-04-22 | Disposition: A | Discharge status: Home / Self Care | Payer: Federal, State, Local not specified - PPO | Source: Ambulatory Visit | Attending: Oncology | Admitting: Oncology

## 2012-04-22 ENCOUNTER — Ambulatory Visit (HOSPITAL_COMMUNITY)
Admission: RE | Admit: 2012-04-22 | Discharge: 2012-04-22 | Disposition: A | Discharge status: Home / Self Care | Payer: Federal, State, Local not specified - PPO | Source: Ambulatory Visit | Attending: Family Medicine | Admitting: Family Medicine

## 2012-04-22 VITALS — BP 129/81 | HR 97 | Temp 98.4°F | Wt 139.1 lb

## 2012-04-22 DIAGNOSIS — R1310 Dysphagia, unspecified: Secondary | ICD-10-CM | POA: Insufficient documentation

## 2012-04-22 DIAGNOSIS — C099 Malignant neoplasm of tonsil, unspecified: Secondary | ICD-10-CM

## 2012-04-22 HISTORY — DX: Reserved for concepts with insufficient information to code with codable children: IMO0002

## 2012-04-22 HISTORY — DX: Reserved for inherently not codable concepts without codable children: IMO0001

## 2012-04-22 NOTE — Progress Notes (Signed)
Radiation Oncology         [phone] ________________________________  Name: Austin Mathis MRN: [id number]  Date: 04/22/2012  DOB: August 28, 1940  Follow-Up Visit Note  CC: [name], MD  Odogwu, Jamie Brookes, MD  Diagnosis:   Stage IV tonsillar carcinoma  Interval Since Last Radiation:  1 months  Narrative:  The patient returns today for routine follow-up.  He continues to have a lot of mucus/saliva production which is quite bothersome for him.  This however is becoming less thick after he is using water rather than milk. I recommend the patient gargle with the combination of baking soda and salt.  This did have a setback after completion of his treatment with pneumonia which is cause more fatigue.                                ALLERGIES:   has no known allergies.  Meds: Current Outpatient Prescriptions  Medication Sig Dispense Refill  . feeding supplement (ENSURE CLINICAL STRENGTH) LIQD Take 237 mLs by mouth 3 (three) times daily with meals.  90 Bottle  1  . lidocaine-prilocaine (EMLA) cream Apply 1 application topically as needed. To access port      . LORazepam (ATIVAN) 1 MG tablet Take 1 mg by mouth every 4 (four) hours as needed. Nausea/vomiting      . omeprazole (PRILOSEC) 20 MG capsule Take 1 capsule (20 mg total) by mouth 2 (two) times daily.  60 capsule  2  . pravastatin (PRAVACHOL) 40 MG tablet Take 40 mg by mouth at bedtime.      . verapamil (COVERA HS) 240 MG (CO) 24 hr tablet Take 120 mg by mouth at bedtime.       . Alum & Mag Hydroxide-Simeth (MAGIC MOUTHWASH W/LIDOCAINE) SOLN Take 10 mLs by mouth 3 (three) times daily.  600 mL  0  . hyaluronate sodium (RADIAPLEXRX) GEL Apply topically 2 (two) times daily.      Marland Kitchen HYDROcodone-acetaminophen (LORTAB) 7.5-500 MG/15ML solution       . ondansetron (ZOFRAN) 8 MG tablet       . potassium chloride 20 MEQ/15ML (10%) solution Take 20 mEq by mouth every other day.       . sucralfate (CARAFATE) 1 GM/10ML suspension Take 10 mLs (1  g total) by mouth 4 (four) times daily -  with meals and at bedtime.  420 mL  0    Physical Findings: The patient is in no acute distress. Patient is alert and oriented.  weight is 139 lb 1.6 oz (63.095 kg). His temperature is 98.4 F (36.9 C). His blood pressure is 129/81 and his pulse is 97. .  A palpable cervical supraclavicular or axillary adenopathy. The lungs are clear except for upper airway congestion. The heart has a regular rhythm and rate. The abdomen is soft and nontender with normal bowel sounds.  The oral cavity reveals significant saliva production. There is no secondary infection noted.  He continues to have a significant amount of mucositis along the soft palate and pharyngeal area.  Lab Findings: Lab Results  Component Value Date   WBC 9.2 04/15/2012   HGB 10.1* 04/15/2012   HCT 30.3* 04/15/2012   MCV 91.0 04/15/2012   PLT 527* 04/15/2012    @LASTCHEM @  Radiographic Findings: Dg Chest 2 View  04/15/2012  *RADIOLOGY REPORT*  Clinical Data: Pneumonia  CHEST - 2 VIEW  Comparison: 03/29/2012  Findings: The cardiac shadow is stable.  There are left-sided chest wall port is again seen with catheter tip in the mid superior vena cava.  No persistent changes in the right lung base which appears slightly worsened in the interval from prior exam.  Similar changes are now seen in the left lung base with improvement in the aeration of the right upper lobe.  IMPRESSION: Waxing and waning infiltrates as described above particularly noted in the bases.   Original Report Authenticated By: Phillips Odor, M.D.    Dg Chest Port 1 View  03/29/2012  *RADIOLOGY REPORT*  Clinical Data: Weakness, cough and fever.  History of squamous cell carcinoma of the tonsils status post recent chemotherapy and radiation therapy.  PORTABLE CHEST - 1 VIEW  Comparison: Portable chest 11/24/2011.  PET CT 11/23/2011.  Findings: Left subclavian Port-A-Cath tip is unchanged at the level of the mid SVC.  The heart size and  mediastinal contours are stable.  There are new patchy airspace opacities within the right upper and lower lobes.  The left lung is clear.  There is no pleural effusion.  IMPRESSION: New right lung air space opacities are nonspecific and may represent infection, aspiration or radiation change.  Correlate clinically.   Original Report Authenticated By: Gerrianne Scale, M.D.     Impression:  The patient is recovering from the effects of radiation.  I have recommended he resume using his magic mouthwash.  Plan:  Followup in one month. Patient will undergo a swallowing study at [facility] today to make sure he is not aspirating.  _____________________________________    Billie Lade, PhD, MD

## 2012-04-22 NOTE — Progress Notes (Signed)
Patient here for routine follow up tonsillar cancer radiation.Denies pain but has occasional nausea relieved by ativan.Excessive drooling and cough.Scheduled for swallowing evaluation today in [place].Ensure has been increased by 2 cans daily up to 5 cans daily.Weight down 9 lbs from 148.4 lbs to  To 137.4 lbs. over last 30 days but is starting to have slight increase in weight.

## 2012-04-23 ENCOUNTER — Telehealth: Payer: Self-pay

## 2012-04-23 NOTE — Telephone Encounter (Signed)
Patient seen on yesterday and is having excessive oral secretions post radiation.spoke with Dr.Kinard and he recommends patient try decongestant.Patient called and informed in agreement and will try.I will check on status at end of week.

## 2012-04-25 ENCOUNTER — Ambulatory Visit (HOSPITAL_COMMUNITY)
Admission: RE | Admit: 2012-04-25 | Discharge: 2012-04-25 | Disposition: A | Discharge status: Home / Self Care | Payer: Federal, State, Local not specified - PPO | Source: Ambulatory Visit | Attending: Family Medicine | Admitting: Family Medicine

## 2012-04-25 DIAGNOSIS — IMO0001 Reserved for inherently not codable concepts without codable children: Secondary | ICD-10-CM | POA: Insufficient documentation

## 2012-04-25 DIAGNOSIS — R1310 Dysphagia, unspecified: Secondary | ICD-10-CM | POA: Insufficient documentation

## 2012-04-25 NOTE — Procedures (Addendum)
Objective Swallowing Evaluation: Modified Barium Swallowing Study  Patient Details  Name: Austin Mathis MRN: [id number] Date of Birth: [birth year]  Today's Date: 04/22/2012 Time:  - 1400    Past Medical History:  Past Medical History  Diagnosis Date  . High cholesterol   . Hearing deficit     Right greater than left.  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 Hypertension     EKG 5/13, chest CT 4/13 [facility], clearance with OV 01/08/12 oncology [facility]  . GERD (gastroesophageal reflux disease)   . Primary squamous cell carcinoma of tonsil 11/16/2011    last chemo 03/18/12; last XRT 03/22/12  . Radiation 02/05/12-03/22/12    7000 cGy 35 fx tonsil/neck   Past Surgical History:  Past Surgical History  Procedure Date  . Portacath placement 11/24/2011    left side  . Multiple extractions with alveoloplasty 01/10/2012    Procedure: MULTIPLE EXTRACION WITH ALVEOLOPLASTY;  Surgeon: Austin Mathis, DDS;  Location: [facility];  Service: Oral Surgery;  Laterality: N/A;  Extraction of tooth #'s 1,3,4,5,6,7,8,9,10,11,12,13,14,15,16,17,18,20,21,22, 23,24,25,26,27,28,29,30 with alveoloplasty.  . Esophagogastroduodenoscopy 01/16/2012    Procedure: ESOPHAGOGASTRODUODENOSCOPY (EGD);  Surgeon: Austin Bali, MD;  Location: [facility];  Service: Endoscopy;  Laterality: N/A;  9:30  . Peg placement 01/16/2012    Procedure: PERCUTANEOUS ENDOSCOPIC GASTROSTOMY (PEG) PLACEMENT;  Surgeon: Austin Bali, MD;  Location: [facility];  Service: Endoscopy;  Laterality: N/A;  Ancef one gram IV before procedure   HPI:  Mr. Austin Mathis is a 71 yo male who was referred by Dr. Jerelyn Mathis for MBSS following conclusion of chemorad for head and neck cancer (03/22/2012). Mr. Austin Mathis is known to this SLP from recent inpatient admission for pneumonia. At that time, he was unable to tolerate much po due to pain and emesis and recommendation for follow up dysphagia therapy was made.   Symptoms/Limitations Symptoms: Coughing and  difficulty swallowing Special Tests: MBSS  Recommendation/Prognosis  Clinical Impression Dysphagia Diagnosis: Mild oral phase dysphagia;Moderate pharyngeal phase dysphagia Clinical impression: Mild oral phase and moderate pharyngeal phase dysphagia characterized by weak oral sling (difficulty holding bolus in oral cavity before spilling over base of tongue), premature spillage, decrease hyolaryngeal excurstion, decreased epiglottic deflection, decreased airway protection resulting in trace but consistent penetration and eventual aspiration of thin and nectar-thick liquids without spontaneous cough response. Recommend D3 (mech soft) and honey thick liquids; ok for Austin Mathis water protocol once trained by clinician. Swallow Evaluation Recommendations Diet Recommendations: Free water protocol after oral care;Dysphagia 3 (Mechanical Soft);Honey-thick liquid Liquid Administration via: Cup Medication Administration: Via alternative means (PEG) Supervision: Patient able to self feed Compensations: Slow rate;Multiple dry swallows after each bite/sip;Effortful swallow;Hard cough after swallow Postural Changes and/or Swallow Maneuvers: Seated upright 90 degrees;Out of bed for meals;Upright 30-60 min after meal Oral Care Recommendations: Oral care before and after PO;Patient independent with oral care Other Recommendations: Order thickener from pharmacy;Clarify dietary restrictions Follow up Recommendations: Outpatient SLP Prognosis Prognosis for Safe Diet Advancement: Good Barriers to Reach Goals: Severity of dysphagia (radiation effects over time) Individuals Consulted Consulted and Agree with Results and Recommendations: Patient;Family member/caregiver Family Member Consulted: Wife Report Sent to : Referring physician  SLP Assessment/Plan Dysphagia Diagnosis: Mild oral phase dysphagia;Moderate pharyngeal phase dysphagia Clinical impression: Mild oral phase and moderate pharyngeal phase dysphagia  characterized by weak oral sling (difficulty holding bolus in oral cavity before spilling over base of tongue), premature spillage, decrease hyolaryngeal excurstion, decreased epiglottic deflection, decreased airway protection resulting in trace but consistent  penetration and eventual aspiration of thin and nectar-thick liquids without spontaneous cough response. Recommend D3 (mech soft) and honey thick liquids; ok for Austin Mathis water protocol once trained by clinician. Recommend dysphagia therapy 2x/week for 4 weeks with reassessment via MBSS in 4-6 weeks.  Short Term Goals: TBD  General:  Date of Onset: 03/22/12 HPI: Mr. Austin Mathis is a 71 yo male who was referred by Dr. Jerelyn Mathis for MBSS following conclusion of chemorad for head and neck cancer (03/22/2012). Austin Mathis is known to this SLP from recent inpatient admission for pneumonia. At that time, he was unable to tolerate much po due to pain and emesis and recommendation for follow up dysphagia therapy was made.  Type of Study: Modified Barium Swallowing Study Reason for Referral: Objectively evaluate swallowing function Diet Prior to this Study: Dysphagia 3 (soft);Thin liquids Temperature Spikes Noted: No Respiratory Status: Room air History of Recent Intubation: No Behavior/Cognition: Alert;Cooperative Oral Cavity - Dentition: Edentulous (excessive pooling of saliva) Oral Motor / Sensory Function: Within functional limits Self-Feeding Abilities: Able to feed self Patient Positioning: Upright in chair Baseline Vocal Quality: Clear Volitional Cough: Strong;Congested Volitional Swallow: Able to elicit Anatomy: Other (Comment) (short epiglottis likely from radiation therapy) Pharyngeal Secretions: Not observed secondary MBS  Reason for Referral:  Objectively evaluate swallowing function   Oral Phase Oral Preparation/Oral Phase Oral Phase: Impaired Oral - Thin Oral - Thin Cup: Other (Comment) (Difficulty holding bolus in oral  cavity when cued) Oral - Solids Oral - Mechanical Soft: Delayed oral transit;Piecemeal swallowing  Pharyngeal Phase  Pharyngeal Phase Pharyngeal Phase: Impaired Pharyngeal - Honey Pharyngeal - Honey Cup: Delayed swallow initiation;Premature spillage to valleculae;Premature spillage to pyriform sinuses;Reduced epiglottic inversion;Reduced anterior laryngeal mobility;Reduced laryngeal elevation;Lateral channel residue Pharyngeal - Nectar Pharyngeal - Nectar Cup: Delayed swallow initiation;Premature spillage to pyriform sinuses;Premature spillage to valleculae;Reduced laryngeal elevation;Reduced anterior laryngeal mobility;Reduced epiglottic inversion;Reduced airway/laryngeal closure;Penetration/Aspiration during swallow;Penetration/Aspiration after swallow;Trace aspiration;Compensatory strategies attempted (Comment);Lateral channel residue (supraglottic, small bolus, chin tuck ineffective) Penetration/Aspiration details (nectar cup): Material enters airway, remains ABOVE vocal cords and not ejected out;Material enters airway, CONTACTS cords and not ejected out;Material enters airway, passes BELOW cords without attempt by patient to eject out (silent aspiration) Pharyngeal - Thin Pharyngeal - Thin Cup: Delayed swallow initiation;Premature spillage to pyriform sinuses;Premature spillage to valleculae;Reduced epiglottic inversion;Reduced anterior laryngeal mobility;Reduced laryngeal elevation;Reduced airway/laryngeal closure;Penetration/Aspiration during swallow;Trace aspiration;Penetration/Aspiration after swallow;Lateral channel residue Penetration/Aspiration details (thin cup): Material enters airway, remains ABOVE vocal cords then ejected out;Material enters airway, remains ABOVE vocal cords and not ejected out;Material enters airway, CONTACTS cords then ejected out;Material enters airway, passes BELOW cords without attempt by patient to eject out (silent aspiration)  Cervical Esophageal Phase    Cervical Esophageal Phase Cervical Esophageal Phase: [facility]  Thank you,  Havery Moros, CCC-SLP [phone]  PORTER,DABNEY 04/25/2012, 12:39 PM                                                     Physician Treatment Plan  Your signature is required to indicate approval of the treatment plan/progress as stated above. Please make a copy of this report for your files and return this physician signed original in the self-addressed envelope or fax to [phone]. COMMENTS/CHANGES:__________________________________________________________________________________________________________________________   ____________________________________  ____________________ PHYSICIAN SIGNATURE                                                    DATE

## 2012-04-26 NOTE — Progress Notes (Signed)
Speech Language Pathology Treatment Patient Details  Name: Austin Mathis MRN: [id number] Date of Birth: Nov 28, 1940  Today's Date: 04/26/2012 Time: 4098-1191 SLP Time Calculation (min): 42 min  Authorization: [employer]  Authorization Time Period:    Authorization Visit#:  2 of  8   HPI:  Symptoms/Limitations Symptoms: He bought store brand thickener and does not care for it. Special Tests: N/A Pain Assessment Currently in Pain?: No/denies Pain Score: 0-No pain Multiple Pain Sites: No  Assessments  See MBSS report from previous session.  Treatment  Dysphagia Therapy Oral Motor Exercises Patient/Family Education Home Exercise Program  SLP Goals  Home Exercise SLP Goal: Patient will Perform Home Exercise Program: Independently SLP Goal: Perform Home Exercise Program - Progress: Progressing toward goal SLP Short Term Goals SLP Short Term Goal 1: Safe and efficient consumption of mechanical soft diet with honey-thick liquids with use of compensatory strategies. SLP Short Term Goal 1 - Progress: Progressing toward goal SLP Short Term Goal 2: Complete oral and pharyngeal excersises on a daily basis after instruction from clinician and written cues.  SLP Short Term Goal 2 - Progress: Progressing toward goal SLP Long Term Goals SLP Long Term Goal 1: Safe and efficient consumption of highest recommended diet with use of strategies. SLP Long Term Goal 1 - Progress: Progressing toward goal  Assessment/Plan  Patient Active Problem List  Diagnosis  . Primary squamous cell carcinoma of tonsil  . Hypertension  . Hypercholesterolemia  . GERD (gastroesophageal reflux disease)  . Dysphagia, pharyngeal  . Gastrostomy tube dependent  . Acute renal failure  . Pharyngitis  . Pneumococcal pneumonia   SLP - End of Session Activity Tolerance: Patient tolerated treatment well General Behavior During Session: [facility] for tasks performed Cognition: [facility] for tasks performed  SLP  Assessment/Plan Clinical Impression Statement: Austin Mathis was in better spirits today and seemed receptive to dysphagia education. He reports that he is a retired [employer] fund[employer] and was familiar with thickened liquids. He purchased [employer] brand thickener and said that it was very clumpy. I encouraged him to try to find the Thicken Up Clear brand and gave him additional samples. He consumed 4oz honey-thick cranberry juice today without incident. He was given written instructions and demonstrations of the [place], Effortful, and Shaker exercises and will complete for homework. He was also provided with instruction on implementing the [employer] and will try this week end.  Speech Therapy Frequency: min 2x/week Duration: 4 weeks Treatment/Interventions: Aspiration precaution training;Pharyngeal strengthening exercises;Diet toleration management by SLP;Trials of upgraded texture/liquids;Compensatory techniques;Compensatory strategies;SLP instruction and feedback;Patient/family education Potential to Achieve Goals: Good Potential Considerations: Severity of impairments  Thank you,  Havery Moros, CCC-SLP [phone]  Deborrah Mabin 04/26/2012, 11:27 AM

## 2012-04-30 ENCOUNTER — Encounter (HOSPITAL_COMMUNITY): Payer: Federal, State, Local not specified - PPO | Attending: Oncology

## 2012-04-30 DIAGNOSIS — C099 Malignant neoplasm of tonsil, unspecified: Secondary | ICD-10-CM

## 2012-04-30 DIAGNOSIS — Z452 Encounter for adjustment and management of vascular access device: Secondary | ICD-10-CM

## 2012-04-30 MED ORDER — HEPARIN SOD (PORK) LOCK FLUSH 100 UNIT/ML IV SOLN
INTRAVENOUS | Status: AC
  Filled 2012-04-30: qty 5

## 2012-04-30 MED ORDER — HEPARIN SOD (PORK) LOCK FLUSH 100 UNIT/ML IV SOLN
500.0000 [IU] | Freq: Once | INTRAVENOUS | Status: AC
  Administered 2012-04-30: 500 [IU] via INTRAVENOUS
  Filled 2012-04-30: qty 5

## 2012-04-30 MED ORDER — SODIUM CHLORIDE 0.9 % IJ SOLN
10.0000 mL | INTRAMUSCULAR | Status: DC | PRN
  Administered 2012-04-30: 10 mL via INTRAVENOUS
  Filled 2012-04-30: qty 10

## 2012-04-30 MED ORDER — SODIUM CHLORIDE 0.9 % IJ SOLN
INTRAMUSCULAR | Status: AC
  Filled 2012-04-30: qty 10

## 2012-04-30 NOTE — Progress Notes (Signed)
Tolerated port flush well.  Good blood return. 

## 2012-05-01 ENCOUNTER — Encounter (HOSPITAL_COMMUNITY): Payer: Self-pay | Admitting: Dentistry

## 2012-05-01 ENCOUNTER — Telehealth (HOSPITAL_COMMUNITY): Payer: Self-pay

## 2012-05-01 ENCOUNTER — Ambulatory Visit (HOSPITAL_COMMUNITY): Payer: Medicaid - Dental | Admitting: Dentistry

## 2012-05-01 VITALS — BP 126/72 | HR 96 | Temp 98.2°F | Wt 138.0 lb

## 2012-05-01 DIAGNOSIS — K117 Disturbances of salivary secretion: Secondary | ICD-10-CM

## 2012-05-01 DIAGNOSIS — R432 Parageusia: Secondary | ICD-10-CM

## 2012-05-01 DIAGNOSIS — Z09 Encounter for follow-up examination after completed treatment for conditions other than malignant neoplasm: Secondary | ICD-10-CM

## 2012-05-01 DIAGNOSIS — K08109 Complete loss of teeth, unspecified cause, unspecified class: Secondary | ICD-10-CM

## 2012-05-01 DIAGNOSIS — R1310 Dysphagia, unspecified: Secondary | ICD-10-CM

## 2012-05-01 DIAGNOSIS — C099 Malignant neoplasm of tonsil, unspecified: Secondary | ICD-10-CM

## 2012-05-01 NOTE — Progress Notes (Signed)
May 01, 2012  BP:  126/72                  P:96              T:   98.2          Wgt: 138 lbs now at start 162 lbs Austin Mathis was previously diagnosed squamous cell carcinoma of the  right tonsil. Patient underwent chemoradiation therapy from 02/05/2012 through 03/22/2012.  The patient now presents for a periodic oral examination after radiation therapy.    REVIEW OF CHIEF COMPLAINTS: DRY MOUTH: Not really.  HARD TO SWALLOW: No. HURT TO SWALLOW: Not really TASTE CHANGES: Taste is starting to come back . SORES IN MOUTH: No TRISMUS SYMPTOMS: Having no problems with trismus. SYMPTOM RELIEF:  Using salt water  HOME ORAL HYGIENE REGIMEN:  TRISMUS EXERCISES: Maximum interincisal opening: 60 mm.   DENTAL EXAM: ORAL HYGIENE(PLAQUE): The patient is edentulous. LOCATION OF MUCOSITIS: None DESCRIPTION OF SALIVA: Incipient xerostomia. ANY EXPOSED BONE: No exposed bone noted. OTHER WATCHED AREAS:   DIAGNOSES: 1.  Xerostomia  2.  Dysgeusia-resolving 3.  Odynophagia-sometimes 4. Edentulous  RECOMMENDATIONS: 1. Brush tongue twice daily. 2. Use trismus exercises as directed. 3. Use Biotene Rinse or salt water/baking soda rinses as needed. 4. Multiple sips of water as needed. 5. Follow up with dentist of his choice for fabrication of upper and lower complete denture starting around 06/22/2012.  Patient was given the name of Dr. Debby Bud in [facility] as a dentist who understood medically complex patient and would be able to fabricate upper lower complete dentures. Call if problems before then. Dr. Kristin Bruins

## 2012-05-01 NOTE — Patient Instructions (Addendum)
Follow up with the dentist of his choice for upper and lower dentures. Do not start fabrication of dentures before 06/22/12. This is three months after the last radiation treatment.  Dr. Kristin Bruins  RECOMMENDATIONS: 1. Brush tongue twice daily. 2. Use trismus exercises as directed. 3. Use Biotene Rinse or salt water/baking soda rinses as needed. 4. Multiple sips of water as needed. 5. Follow up with dentist of his choice for fabrication of upper and lower complete denture starting around 06/22/2012.  Patient was given the name of Dr. Debby Bud in [facility] as a dentist who understood medically complex patient and would be able to fabricate upper lower complete dentures. Call if problems before then. Dr. Kristin Bruins

## 2012-05-01 NOTE — Telephone Encounter (Signed)
Spoke with daughter she will let Austin Mathis know that ensure is not a covered expense under his insurance plan.  Instructed her that if we have samples available we would be happy to give them to him when possible.

## 2012-05-01 NOTE — Telephone Encounter (Signed)
Message copied by Evelena Leyden on Wed May 01, 2012  2:13 PM ------      Message from: Mahalia Longest      Created: Tue Apr 16, 2012 12:23 PM       Ensure is not a covered expense under pt's plan.

## 2012-05-03 ENCOUNTER — Ambulatory Visit (HOSPITAL_COMMUNITY)
Admission: RE | Admit: 2012-05-03 | Discharge: 2012-05-03 | Disposition: A | Discharge status: Home / Self Care | Payer: Federal, State, Local not specified - PPO | Source: Ambulatory Visit | Attending: Oncology | Admitting: Oncology

## 2012-05-03 ENCOUNTER — Ambulatory Visit (HOSPITAL_COMMUNITY): Payer: Self-pay | Admitting: Speech Pathology

## 2012-05-03 DIAGNOSIS — Z09 Encounter for follow-up examination after completed treatment for conditions other than malignant neoplasm: Secondary | ICD-10-CM | POA: Insufficient documentation

## 2012-05-03 DIAGNOSIS — J189 Pneumonia, unspecified organism: Secondary | ICD-10-CM | POA: Insufficient documentation

## 2012-05-03 DIAGNOSIS — C099 Malignant neoplasm of tonsil, unspecified: Secondary | ICD-10-CM

## 2012-05-15 ENCOUNTER — Ambulatory Visit (HOSPITAL_COMMUNITY): Payer: Self-pay | Admitting: Oncology

## 2012-05-15 ENCOUNTER — Encounter (HOSPITAL_COMMUNITY): Payer: Self-pay | Admitting: Oncology

## 2012-05-23 ENCOUNTER — Encounter: Payer: Self-pay | Admitting: Radiation Oncology

## 2012-05-23 ENCOUNTER — Ambulatory Visit: Payer: Federal, State, Local not specified - PPO | Attending: Radiation Oncology | Admitting: Radiation Oncology

## 2012-05-23 ENCOUNTER — Encounter (HOSPITAL_BASED_OUTPATIENT_CLINIC_OR_DEPARTMENT_OTHER): Payer: Federal, State, Local not specified - PPO | Admitting: Oncology

## 2012-05-23 VITALS — BP 117/80 | HR 97 | Temp 98.5°F | Resp 20 | Wt 141.0 lb

## 2012-05-23 DIAGNOSIS — C099 Malignant neoplasm of tonsil, unspecified: Secondary | ICD-10-CM

## 2012-05-23 DIAGNOSIS — B977 Papillomavirus as the cause of diseases classified elsewhere: Secondary | ICD-10-CM

## 2012-05-23 LAB — COMPREHENSIVE METABOLIC PANEL
ALT: 8 U/L (ref 0–53)
AST: 15 U/L (ref 0–37)
Albumin: 3.3 g/dL — ABNORMAL LOW (ref 3.5–5.2)
Alkaline Phosphatase: 51 U/L (ref 39–117)
Calcium: 9.8 mg/dL (ref 8.4–10.5)
GFR calc Af Amer: 83 mL/min — ABNORMAL LOW (ref >90)
Glucose, Bld: 104 mg/dL — ABNORMAL HIGH (ref 70–99)
Potassium: 3.8 mEq/L (ref 3.5–5.1)
Sodium: 134 mEq/L — ABNORMAL LOW (ref 135–145)
Total Protein: 8.1 g/dL (ref 6.0–8.3)

## 2012-05-23 LAB — CBC WITH DIFFERENTIAL/PLATELET
Basophils Absolute: 0 10*3/uL (ref 0.0–0.1)
Basophils Relative: 0 % (ref 0–1)
Eosinophils Absolute: 0.2 10*3/uL (ref 0.0–0.7)
Eosinophils Relative: 2 % (ref 0–5)
Lymphs Abs: 1.1 10*3/uL (ref 0.7–4.0)
MCH: 31.7 pg (ref 26.0–34.0)
MCHC: 33.5 g/dL (ref 30.0–36.0)
MCV: 94.8 fL (ref 78.0–100.0)
Neutrophils Relative %: 75 % (ref 43–77)
Platelets: 288 10*3/uL (ref 150–400)
RBC: 2.68 MIL/uL — ABNORMAL LOW (ref 4.22–5.81)
RDW: 16.7 % — ABNORMAL HIGH (ref 11.5–15.5)

## 2012-05-23 MED ORDER — SODIUM CHLORIDE 0.9 % IJ SOLN
INTRAMUSCULAR | Status: AC
  Filled 2012-05-23: qty 10

## 2012-05-23 MED ORDER — HEPARIN SOD (PORK) LOCK FLUSH 100 UNIT/ML IV SOLN
500.0000 [IU] | Freq: Once | INTRAVENOUS | Status: DC
  Filled 2012-05-23: qty 5

## 2012-05-23 MED ORDER — SODIUM CHLORIDE 0.9 % IJ SOLN
10.0000 mL | INTRAMUSCULAR | Status: DC | PRN
  Filled 2012-05-23: qty 10

## 2012-05-23 MED ORDER — HEPARIN SOD (PORK) LOCK FLUSH 100 UNIT/ML IV SOLN
INTRAVENOUS | Status: AC
  Filled 2012-05-23: qty 5

## 2012-05-23 NOTE — Progress Notes (Signed)
[name], MD [address] [place] [address]  1. Primary squamous cell carcinoma of tonsil     CURRENT THERAPY: Observation  INTERVAL HISTORY: Austin Mathis 71 y.o. 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   He is S/P concomitant chemoradiation with weekly cisplatin 40 mg/m2 from 11/27/2011- 03/18/2012.  He was recently seen by oncologic dentist, Dr. Kristin Bruins, in [place] on 05/01/2012.  Mr. Drone was encouraged to follow up with dentist of his choice for fabrication of upper and lower complete denture starting around 06/22/2012. Patient was given the name of Dr. Debby Bud in [facility] as a dentist who understood medically complex patient and would be able to fabricate upper lower complete dentures per Dr. Kristin Bruins.  Wint reports that he is going to wait until April 2014 for insurance purposes before he pursue's dentures.   Mr. Slinger has gained 4 lbs since being discharged from the hospital from pneumonia.  He reports to the nurse that his appetite is weak and he has mild nausea.  He does not complain of then during our discussion, but he is gaining weight which is promising.  He also has anti-emetics when necessary.   Oncologically, Mr. Arcos is doing well.  Complete ROS questioning is negative.    Past Medical History  Diagnosis Date  . High cholesterol   . Hearing deficit     Right greater than left.  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 Hypertension     EKG 5/13, chest CT 4/13 [facility], clearance with OV 01/08/12 oncology [facility]  . GERD (gastroesophageal reflux disease)   . Primary squamous cell carcinoma of tonsil 11/16/2011    last chemo 03/18/12; last XRT 03/22/12  . Radiation 02/05/12-03/22/12    7000 cGy 35 fx tonsil/neck  . Pneumonia     has Primary squamous cell carcinoma of tonsil; Hypertension; Hypercholesterolemia;  GERD (gastroesophageal reflux disease); Dysphagia, pharyngeal; Gastrostomy tube dependent; Acute renal failure; Pharyngitis; and Pneumococcal pneumonia on his problem list.      has no known allergies.  Mr. Gaus had no medications administered during this visit.  Past Surgical History  Procedure Date  . Portacath placement 11/24/2011    left side  . Multiple extractions with alveoloplasty 01/10/2012    Procedure: MULTIPLE EXTRACION WITH ALVEOLOPLASTY;  Surgeon: Charlynne Pander, DDS;  Location: [facility]S;  Service: Oral Surgery;  Laterality: N/A;  Extraction of tooth #'s 1,3,4,5,6,7,8,9,10,11,12,13,14,15,16,17,18,20,21,22, 23,24,25,26,27,28,29,30 with alveoloplasty.  . Esophagogastroduodenoscopy 01/16/2012    Procedure: ESOPHAGOGASTRODUODENOSCOPY (EGD);  Surgeon: West Bali, MD;  Location: [facility] END[facility] SUITE;  Service: Endoscopy;  Laterality: N/A;  9:30  . Peg placement 01/16/2012    Procedure: PERCUTANEOUS ENDOSCOPIC GASTROSTOMY (PEG) PLACEMENT;  Surgeon: West Bali, MD;  Location: [facility] SUITE;  Service: Endoscopy;  Laterality: N/A;  Ancef one gram IV before procedure    Denies any headaches, dizziness, double vision, fevers, chills, night sweats, nausea, vomiting, diarrhea, constipation, chest pain, heart palpitations, shortness of breath, blood in stool, black tarry stool, urinary pain, urinary burning, urinary frequency, hematuria.   PHYSICAL EXAMINATION  ECOG PERFORMANCE STATUS: 1 - Symptomatic but completely ambulatory  Filed Vitals:   05/23/12 1009  BP: 117/80  Pulse: 97  Temp: 98.5 F (36.9 C)  Resp: 20    GENERAL:alert, no distress, well nourished, well developed, comfortable, cooperative and smiling SKIN: skin color, texture,  turgor are normal, no rashes or significant lesions HEAD: Normocephalic, No masses, lesions, tenderness or abnormalities EYES: normal, Conjunctiva are pink and non-injected EARS: External ears normal OROPHARYNX:lips, buccal mucosa, and tongue  normal, mucous membranes are moist and no intra-oral abnormalities noted.   NECK: supple, no adenopathy, thyroid normal size, non-tender, without nodularity, no stridor, non-tender, trachea midline LYMPH:  no palpable lymphadenopathy BREAST:not examined LUNGS: clear to auscultation  HEART: regular rate & rhythm, no murmurs, no gallops, S1 normal and S2 normal ABDOMEN:abdomen soft, non-tender and normal bowel sounds BACK: Back symmetric, no curvature. EXTREMITIES:less then 2 second capillary refill, no joint deformities, effusion, or inflammation, no edema, no skin discoloration, no clubbing, no cyanosis  NEURO: alert & oriented x 3 with fluent speech, no focal motor/sensory deficits, gait normal    LABORATORY DATA: CBC    Component Value Date/Time   WBC 9.2 04/15/2012 1438   RBC 3.33* 04/15/2012 1438   HGB 10.1* 04/15/2012 1438   HCT 30.3* 04/15/2012 1438   PLT 527* 04/15/2012 1438   MCV 91.0 04/15/2012 1438   MCH 30.3 04/15/2012 1438   MCHC 33.3 04/15/2012 1438   RDW 16.4* 04/15/2012 1438   LYMPHSABS 1.6 04/15/2012 1438   MONOABS 1.6* 04/15/2012 1438   EOSABS 0.0 04/15/2012 1438   BASOSABS 0.0 04/15/2012 1438      Chemistry      Component Value Date/Time   NA 134* 04/15/2012 1438   K 3.7 04/15/2012 1438   CL 94* 04/15/2012 1438   CO2 29 04/15/2012 1438   BUN 18 04/15/2012 1438   CREATININE 1.25 04/15/2012 1438      Component Value Date/Time   CALCIUM 9.6 04/15/2012 1438   ALKPHOS 65 04/15/2012 1438   AST 16 04/15/2012 1438   ALT 15 04/15/2012 1438   BILITOT 0.2* 04/15/2012 1438        PATHOLOGY: Right tonsillar mass, invasive squamous cell carcinoma, HPV+, strong p16 immunohistochemical staining.     ASSESSMENT:  1. 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   2. Malnutrition with significant weight loss in past.  Most recently has gained 4 lbs since completion of therapy.    PLAN: 1. I personally  reviewed and went over laboratory results with the patient. 2. Lab work today: CBC diff, CMET 3. Restaging/Surveillance PET scan as scheduled in November. 4. Return in December for follow-up after PET scan.  At that time will discuss Port removal.    All questions were answered. The patient knows to call the clinic with any problems, questions or concerns. We can certainly see the patient much sooner if necessary.  The patient and plan discussed with Glenford Peers, MD and he is in agreement with the aforementioned.  KEFALAS,THOMAS

## 2012-05-23 NOTE — Patient Instructions (Addendum)
[facility]  Discharge Instructions  RECOMMENDATIONS MADE BY THE CONSULTANT AND ANY TEST RESULTS WILL BE SENT TO YOUR REFERRING DOCTOR.   EXAM FINDINGS BY MD TODAY AND SIGNS AND SYMPTOMS TO REPORT TO CLINIC OR PRIMARY MD:   Port flush done today. If scans are good next month we will let you have port removed. SPECIAL INSTRUCTIONS/FOLLOW-UP: Pet scan as scheduled To see Dr. Mariel Sleet after scans   I acknowledge that I have been informed and understand all the instructions given to me and received a copy. I do not have any more questions at this time, but understand that I may call the Specialty Clinic at [facility] at [phone] during business hours should I have any further questions or need assistance in obtaining follow-up care.    __________________________________________  _____________  __________ Signature of Patient or Authorized Representative            Date                   Time    __________________________________________ Nurse's Signature

## 2012-05-23 NOTE — Progress Notes (Signed)
Austin Mathis presented for Portacath access and flush. Proper placement of portacath confirmed by CXR. Portacath located lt chest wall accessed with  H 20 needle. Good blood return present. Portacath flushed with 20ml NS and 500U/53ml Heparin and needle removed intact. Procedure without incident. Patient tolerated procedure well.

## 2012-05-24 ENCOUNTER — Ambulatory Visit (HOSPITAL_COMMUNITY): Payer: Self-pay | Admitting: Oncology

## 2012-05-27 ENCOUNTER — Ambulatory Visit (HOSPITAL_COMMUNITY): Payer: Self-pay | Admitting: Oncology

## 2012-06-04 ENCOUNTER — Telehealth (HOSPITAL_COMMUNITY): Payer: Self-pay

## 2012-06-04 ENCOUNTER — Other Ambulatory Visit (HOSPITAL_COMMUNITY): Payer: Self-pay

## 2012-06-04 DIAGNOSIS — C099 Malignant neoplasm of tonsil, unspecified: Secondary | ICD-10-CM

## 2012-06-04 NOTE — Telephone Encounter (Signed)
Call from patient stating "My throat is swollen from my neck to my chin for last 2 days.  It just looks fat."  Denies any pain, difficulty in swallowing, fever or other symptoms.  Has PET scan scheduled for 11/26 and follow-up appointment on 12/4.  Wants to know if he needs to do anything about the swelling.

## 2012-06-05 ENCOUNTER — Encounter (HOSPITAL_COMMUNITY): Payer: Federal, State, Local not specified - PPO | Attending: Oncology

## 2012-06-05 ENCOUNTER — Encounter (HOSPITAL_BASED_OUTPATIENT_CLINIC_OR_DEPARTMENT_OTHER): Payer: Federal, State, Local not specified - PPO | Admitting: Oncology

## 2012-06-05 DIAGNOSIS — C099 Malignant neoplasm of tonsil, unspecified: Secondary | ICD-10-CM

## 2012-06-05 DIAGNOSIS — R220 Localized swelling, mass and lump, head: Secondary | ICD-10-CM

## 2012-06-05 LAB — CBC WITH DIFFERENTIAL/PLATELET
Basophils Relative: 0 % (ref 0–1)
Eosinophils Relative: 6 % — ABNORMAL HIGH (ref 0–5)
HCT: 28.8 % — ABNORMAL LOW (ref 39.0–52.0)
Hemoglobin: 9.8 g/dL — ABNORMAL LOW (ref 13.0–17.0)
Lymphocytes Relative: 13 % (ref 12–46)
MCHC: 34 g/dL (ref 30.0–36.0)
MCV: 95.4 fL (ref 78.0–100.0)
Monocytes Absolute: 0.8 10*3/uL (ref 0.1–1.0)
Monocytes Relative: 10 % (ref 3–12)
Neutro Abs: 5.6 10*3/uL (ref 1.7–7.7)
RDW: 15.5 % (ref 11.5–15.5)

## 2012-06-05 LAB — COMPREHENSIVE METABOLIC PANEL
BUN: 21 mg/dL (ref 6–23)
CO2: 29 mEq/L (ref 19–32)
Calcium: 10 mg/dL (ref 8.4–10.5)
Chloride: 96 mEq/L (ref 96–112)
Creatinine, Ser: 1.14 mg/dL (ref 0.50–1.35)
GFR calc non Af Amer: 62 mL/min — ABNORMAL LOW (ref >90)
Total Bilirubin: 0.2 mg/dL — ABNORMAL LOW (ref 0.3–1.2)

## 2012-06-05 LAB — TSH: TSH: 4.364 u[IU]/mL (ref 0.350–4.500)

## 2012-06-05 NOTE — Progress Notes (Signed)
Labs drawn today for cbc/diff,cmp,tsh 

## 2012-06-05 NOTE — Patient Instructions (Addendum)
[facility]  Discharge Instructions  RECOMMENDATIONS MADE BY THE CONSULTANT AND ANY TEST RESULTS WILL BE SENT TO YOUR REFERRING DOCTOR.   EXAM FINDINGS BY MD TODAY AND SIGNS AND SYMPTOMS TO REPORT TO CLINIC OR PRIMARY MD: Exam and discussion by MD.  Lisabeth Devoid it's a reaction to radiation.  We will have you see Dr. Suszanne Conners on 06/13/12 at 1:50pm    SPECIAL INSTRUCTIONS/FOLLOW-UP: Return to Clinic as scheduled.   I acknowledge that I have been informed and understand all the instructions given to me and received a copy. I do not have any more questions at this time, but understand that I may call the Specialty Clinic at [facility] at [phone] during business hours should I have any further questions or need assistance in obtaining follow-up care.    __________________________________________  _____________  __________ Signature of Patient or Authorized Representative            Date                   Time    __________________________________________ Nurse's Signature

## 2012-06-05 NOTE — Progress Notes (Signed)
The patient is a work in today and is accompanied by his wife. Approximate 4-5 days ago he noticed swelling in the neck in the submental area. It is not tender or painful nor does have fever chills. No trouble swallowing, he's gained weight feels better and his lab work is better. This is area of soft tissue swelling not indurated whatsoever extending for about 10-12 cm and approximately 4 cm wide. This is soft but deathly wasn't present when we saw him last. He has no intraoral lesions I can appreciate by palpation as well as but visualization. Therefore I will refer him to his ear nose and throat surgeon for consultation check his TSH level. He is due for a PET scan in the near future namely 2 weeks which we will continue to pursue. He thinks that over the last 2 days this swelling in the neck anteriorly has gone down some but it is still quite prominent what ever it is.

## 2012-06-07 ENCOUNTER — Ambulatory Visit (HOSPITAL_COMMUNITY): Payer: Self-pay | Admitting: Oncology

## 2012-06-13 ENCOUNTER — Ambulatory Visit (INDEPENDENT_AMBULATORY_CARE_PROVIDER_SITE_OTHER): Payer: Federal, State, Local not specified - PPO | Admitting: Otolaryngology

## 2012-06-13 DIAGNOSIS — C099 Malignant neoplasm of tonsil, unspecified: Secondary | ICD-10-CM

## 2012-06-13 DIAGNOSIS — R220 Localized swelling, mass and lump, head: Secondary | ICD-10-CM

## 2012-06-18 ENCOUNTER — Encounter (HOSPITAL_COMMUNITY)
Admission: RE | Admit: 2012-06-18 | Discharge: 2012-06-18 | Disposition: A | Discharge status: Home / Self Care | Payer: Federal, State, Local not specified - PPO | Source: Ambulatory Visit | Attending: Oncology | Admitting: Oncology

## 2012-06-18 DIAGNOSIS — C099 Malignant neoplasm of tonsil, unspecified: Secondary | ICD-10-CM

## 2012-06-18 DIAGNOSIS — Z931 Gastrostomy status: Secondary | ICD-10-CM | POA: Insufficient documentation

## 2012-06-18 MED ORDER — FLUDEOXYGLUCOSE F - 18 (FDG) INJECTION
19.0000 | Freq: Once | INTRAVENOUS | Status: AC | PRN
  Administered 2012-06-18: 19 via INTRAVENOUS

## 2012-06-26 ENCOUNTER — Encounter (HOSPITAL_COMMUNITY): Payer: Self-pay | Admitting: Oncology

## 2012-06-26 ENCOUNTER — Encounter (HOSPITAL_COMMUNITY): Payer: Federal, State, Local not specified - PPO | Attending: Oncology | Admitting: Oncology

## 2012-06-26 VITALS — BP 130/83 | HR 83 | Temp 98.9°F | Resp 18 | Wt 145.2 lb

## 2012-06-26 DIAGNOSIS — C099 Malignant neoplasm of tonsil, unspecified: Secondary | ICD-10-CM

## 2012-06-26 DIAGNOSIS — D649 Anemia, unspecified: Secondary | ICD-10-CM

## 2012-06-26 DIAGNOSIS — B977 Papillomavirus as the cause of diseases classified elsewhere: Secondary | ICD-10-CM

## 2012-06-26 DIAGNOSIS — I878 Other specified disorders of veins: Secondary | ICD-10-CM

## 2012-06-26 DIAGNOSIS — I998 Other disorder of circulatory system: Secondary | ICD-10-CM | POA: Insufficient documentation

## 2012-06-26 LAB — CBC WITH DIFFERENTIAL/PLATELET
Basophils Absolute: 0 10*3/uL (ref 0.0–0.1)
Eosinophils Absolute: 0.3 10*3/uL (ref 0.0–0.7)
Eosinophils Relative: 3 % (ref 0–5)
HCT: 27.8 % — ABNORMAL LOW (ref 39.0–52.0)
Lymphocytes Relative: 15 % (ref 12–46)
Lymphs Abs: 1.2 10*3/uL (ref 0.7–4.0)
MCH: 32.1 pg (ref 26.0–34.0)
MCV: 95.9 fL (ref 78.0–100.0)
Monocytes Absolute: 1 10*3/uL (ref 0.1–1.0)
RDW: 13.8 % (ref 11.5–15.5)
WBC: 7.9 10*3/uL (ref 4.0–10.5)

## 2012-06-26 MED ORDER — SODIUM CHLORIDE 0.9 % IJ SOLN
INTRAMUSCULAR | Status: AC
  Filled 2012-06-26: qty 10

## 2012-06-26 MED ORDER — HEPARIN SOD (PORK) LOCK FLUSH 100 UNIT/ML IV SOLN
500.0000 [IU] | Freq: Once | INTRAVENOUS | Status: AC
  Administered 2012-06-26: 500 [IU] via INTRAVENOUS
  Filled 2012-06-26: qty 5

## 2012-06-26 MED ORDER — HEPARIN SOD (PORK) LOCK FLUSH 100 UNIT/ML IV SOLN
INTRAVENOUS | Status: AC
  Filled 2012-06-26: qty 5

## 2012-06-26 MED ORDER — SODIUM CHLORIDE 0.9 % IJ SOLN
10.0000 mL | INTRAMUSCULAR | Status: DC | PRN
  Administered 2012-06-26: 10 mL via INTRAVENOUS
  Filled 2012-06-26: qty 10

## 2012-06-26 NOTE — Progress Notes (Signed)
Austin Mathis presented for Portacath access and flush. Proper placement of portacath confirmed by CXR. Portacath located right chest wall accessed with  H 20 needle. Good blood return present. Portacath flushed with 20ml NS and 500U/4ml Heparin and needle removed intact. Procedure without incident. Patient tolerated procedure well.

## 2012-06-26 NOTE — Progress Notes (Signed)
Problem #1 stage IV squamous cell carcinoma tonsil, HPV positive, status post biopsy followed by concomitant chemoradiation. His therapy was from 11/27/2011 through 03/18/2012. His PET scan as of 06/18/2012 shows no evidence for recurrent disease. All disease has disappeared his laboratory work from 06/26/2012 shows that his hemoglobin is stabilized seen white count and platelets are normal and his labs from November 13 shows that his albumin is now normal liver enzymes are very stable to normal and renal function is stable and normal etc.  He is concerned it doesn't feel any stronger but he was evaluated by his ear nose and throat physician we cannot find anything clinically wrong with his head and neck area and that the swelling underneath his chin was most likely is reactive from the radiation.  He would like his Port-A-Cath out which we will arrange and he would like his G-tube at which we will arrange. We will see him otherwise we get back from [place] in April. We will decide on a PET scan in followup at that time but he is doing fine both from a physical standpoint and laboratory standpoint we may postpone a PET scan in followup until September of 2014.Marland Kitchen

## 2012-06-26 NOTE — Patient Instructions (Addendum)
[facility] Discharge Instructions  RECOMMENDATIONS MADE BY THE CONSULTANT AND ANY TEST RESULTS WILL BE SENT TO YOUR REFERRING PHYSICIAN.  EXAM FINDINGS BY THE PHYSICIAN TODAY AND SIGNS OR SYMPTOMS TO REPORT TO CLINIC OR PRIMARY PHYSICIAN: Discussion by MD. Your scan was good.  We will check some blood work today to see if we can find a reason for your poor appetite.  You can get your port and your gastrostomy tube removed.  MEDICATIONS PRESCRIBED:  none  INSTRUCTIONS GIVEN AND DISCUSSED: Report swallowing difficulty, shortness of breath or other problems  SPECIAL INSTRUCTIONS/FOLLOW-UP: For follow-up in 6 months.  Thank you for choosing [facility] to provide your oncology and hematology care.  To afford each patient quality time with our providers, please arrive at least 15 minutes before your scheduled appointment time.  With your help, our goal is to use those 15 minutes to complete the necessary work-up to ensure our physicians have the information they need to help with your evaluation and healthcare recommendations.    Effective January 1st, 2014, we ask that you re-schedule your appointment with our physicians should you arrive 10 or more minutes late for your appointment.  We strive to give you quality time with our providers, and arriving late affects you and other patients whose appointments are after yours.    Again, thank you for choosing [facility].  Our hope is that these requests will decrease the amount of time that you wait before being seen by our physicians.       _____________________________________________________________  I acknowledge that I have been informed and understand all the instructions given to me and received a copy. I do not have anymore questions at this time but understand that I may call the Cancer Center at [facility] at [phone] during business hours should I have any further questions or need  assistance in obtaining follow-up care.    __________________________________________  _____________  __________ Signature of Patient or Authorized Representative            Date                   Time    __________________________________________ Nurse's Signature

## 2012-06-27 LAB — IRON AND TIBC
Saturation Ratios: 18 % — ABNORMAL LOW (ref 20–55)
TIBC: 252 ug/dL (ref 215–435)

## 2012-06-27 LAB — FERRITIN: Ferritin: 912 ng/mL — ABNORMAL HIGH (ref 22–322)

## 2012-07-03 ENCOUNTER — Encounter: Payer: Self-pay | Admitting: Gastroenterology

## 2012-07-04 ENCOUNTER — Encounter (HOSPITAL_COMMUNITY): Payer: Self-pay | Admitting: Pharmacy Technician

## 2012-07-04 ENCOUNTER — Ambulatory Visit: Payer: Self-pay | Admitting: Urgent Care

## 2012-07-04 MED ORDER — CEFAZOLIN SODIUM 1-5 GM-% IV SOLN: 1.0000 g | Freq: Once | INTRAVENOUS | Status: DC

## 2012-07-04 MED ORDER — CEFAZOLIN SODIUM-DEXTROSE 2-3 GM-% IV SOLR: 2.0000 g | Freq: Once | INTRAVENOUS | Status: DC

## 2012-07-04 NOTE — Patient Instructions (Addendum)
   Your procedure is scheduled on: 07/08/2012  Report to [facility] at   8:00  AM.  Call this number if you have problems the morning of surgery: [phone]   Remember:   Do not drink or eat food:After Midnight.  :  Take these medicines the morning of surgery with A SIP OF WATER: Verapamil, omeprazole   Do not wear jewelry, make-up or nail polish.  Do not wear lotions, powders, or perfumes. You may wear deodorant.  Do not shave 48 hours prior to surgery. Men may shave face and neck.  Do not bring valuables to the hospital.  Contacts, dentures or bridgework may not be worn into surgery.  Leave suitcase in the car. After surgery it may be brought to your room.  For patients admitted to the hospital, checkout time is 11:00 AM the day of discharge.   Patients discharged the day of surgery will not be allowed to drive home.    Special Instructions: Shower using CHG 2 nights before surgery and the night before surgery.  If you shower the day of surgery use CHG.  Use special wash - you have one bottle of CHG for all showers.  You should use approximately 1/3 of the bottle for each shower.   Please read over the following fact sheets that you were given: Pain Booklet, MRSA Information, Surgical Site Infection Prevention and Care and Recovery After Surgery

## 2012-07-04 NOTE — Consult Note (Signed)
NAMEMarland Kitchen  MARGUES, FILIPPINI NO.:  [id number]  MEDICAL RECORD NO.:  [id number]  LOCATION:  PERIO                         FACILITY:  [facility]  PHYSICIAN:  Barbaraann Barthel, M.D. DATE OF BIRTH:  [birth year]  DATE OF CONSULTATION:  07/03/2012 DATE OF DISCHARGE:                                CONSULTATION   DIAGNOSIS:  Status post chemotherapy and radiation therapy for squamous cell carcinoma of the tonsils.  NOTE:  This is a 71 year old [place] male who was treated by the Medical Service and the Radiation Service for carcinoma of the tonsils. He had a Port-A-Cath placed and he is now in complete remission and desires to have this removed.  I have checked with Dr. Mariel Sleet, and he is agreeable with having this removed at present.  I discussed complications including bleeding and infection and informed consent was obtained.  PAST MEDICAL HISTORY:  He has a past history of hypertension and he had a Port-A-Cath placed on Nov 24, 2011, and as stated, he has a history of squamous cell carcinoma of the tonsils.  For his medications, see present medical list.  He is a nonsmoker, nondrinker.  PHYSICAL EXAMINATION:  VITAL SIGNS:  He is 5 feet 7 inches, weighs 144 pounds, his temperature is 98.3, his pulse is 88 and regular, respirations 24, blood pressure 130/76. HEENT:  Head is normocephalic.  Eyes, extraocular movements are intact. Pupils are round and reactive to light and accommodation.  There is noted some conjunctive pallor.  The patient is edentulous.  He has probably some thrush on his tongue.  He has an indurated midline neck changes that are likely due to previous radiation changes of the soft tissues in this area.  I do not feel any suspicious cervical adenopathy, however. CHEST:  His breath sounds are diminished somewhat, but fairly clear. HEART:  Regular rhythm. ABDOMEN:  Soft. RECTAL:  Deferred. EXTREMITIES:  Grossly within normal limits.  REVIEW OF  SYSTEMS:  NEURO SYSTEM:  No history of migraines or seizures. No history of endocrine history.  No history of diabetes or thyroid disease.  CARDIOPULMONARY SYSTEM:  History of hypertension.  He also was treated in August for pneumonia.  MUSCULOSKELETAL SYSTEM:  Arthritic changes consistent with his age.  GI SYSTEM:  No past history of hepatitis, constipation, bright red rectal bleeding, or black tarry stools.  No unexplained weight loss.  The patient has not had a colonoscopy.  GU SYSTEM:  No history of frequency, dysuria, or kidney stones.  Review of history and physical, therefore, Mr. Eckerson is a 71 year old Hispanic who is status post chemotherapy and radiation therapy for squamous cell carcinoma of the tonsils.  He is in complete remission and he is to have his Port-A-Cath removed.  We will plan to do this via the Outpatient Department.     Barbaraann Barthel, M.D.     WB/MEDQ  D:  07/03/2012  T:  07/04/2012  Job:  [id number]

## 2012-07-05 ENCOUNTER — Encounter (HOSPITAL_COMMUNITY)
Admission: RE | Admit: 2012-07-05 | Discharge: 2012-07-05 | Disposition: A | Discharge status: Home / Self Care | Payer: Federal, State, Local not specified - PPO | Source: Ambulatory Visit | Attending: General Surgery | Admitting: General Surgery

## 2012-07-05 ENCOUNTER — Encounter (HOSPITAL_COMMUNITY): Payer: Self-pay

## 2012-07-05 LAB — CBC WITH DIFFERENTIAL/PLATELET
Basophils Absolute: 0 10*3/uL (ref 0.0–0.1)
HCT: 31.2 % — ABNORMAL LOW (ref 39.0–52.0)
Hemoglobin: 10.5 g/dL — ABNORMAL LOW (ref 13.0–17.0)
Lymphocytes Relative: 17 % (ref 12–46)
Lymphs Abs: 1.3 10*3/uL (ref 0.7–4.0)
Monocytes Absolute: 0.6 10*3/uL (ref 0.1–1.0)
Monocytes Relative: 8 % (ref 3–12)
Neutro Abs: 5.5 10*3/uL (ref 1.7–7.7)
RBC: 3.28 MIL/uL — ABNORMAL LOW (ref 4.22–5.81)
RDW: 13.6 % (ref 11.5–15.5)
WBC: 7.6 10*3/uL (ref 4.0–10.5)

## 2012-07-05 LAB — BASIC METABOLIC PANEL
CO2: 33 mEq/L — ABNORMAL HIGH (ref 19–32)
Chloride: 97 mEq/L (ref 96–112)
Creatinine, Ser: 1.32 mg/dL (ref 0.50–1.35)
Glucose, Bld: 102 mg/dL — ABNORMAL HIGH (ref 70–99)

## 2012-07-05 LAB — SURGICAL PCR SCREEN: Staphylococcus aureus: POSITIVE — AB

## 2012-07-05 MED ORDER — CEFAZOLIN SODIUM 1-5 GM-% IV SOLN
1.0000 g | Freq: Once | INTRAVENOUS | Status: DC
Start: 2012-07-05 — End: 2012-07-05

## 2012-07-08 ENCOUNTER — Encounter (HOSPITAL_COMMUNITY): Payer: Self-pay | Admitting: *Deleted

## 2012-07-08 ENCOUNTER — Encounter (HOSPITAL_COMMUNITY): Payer: Self-pay | Admitting: Anesthesiology

## 2012-07-08 ENCOUNTER — Ambulatory Visit (HOSPITAL_COMMUNITY)
Admission: RE | Admit: 2012-07-08 | Discharge: 2012-07-08 | Disposition: A | Discharge status: Home / Self Care | Payer: Federal, State, Local not specified - PPO | Source: Ambulatory Visit | Attending: General Surgery | Admitting: General Surgery

## 2012-07-08 ENCOUNTER — Telehealth: Payer: Self-pay | Admitting: Gastroenterology

## 2012-07-08 ENCOUNTER — Ambulatory Visit (HOSPITAL_COMMUNITY): Payer: Federal, State, Local not specified - PPO | Admitting: Anesthesiology

## 2012-07-08 ENCOUNTER — Encounter (HOSPITAL_COMMUNITY)
Admission: RE | Disposition: A | Discharge status: Home / Self Care | Payer: Self-pay | Source: Ambulatory Visit | Attending: General Surgery

## 2012-07-08 ENCOUNTER — Ambulatory Visit: Payer: Federal, State, Local not specified - PPO | Admitting: Gastroenterology

## 2012-07-08 DIAGNOSIS — Z85819 Personal history of malignant neoplasm of unspecified site of lip, oral cavity, and pharynx: Secondary | ICD-10-CM | POA: Insufficient documentation

## 2012-07-08 DIAGNOSIS — Z931 Gastrostomy status: Secondary | ICD-10-CM

## 2012-07-08 DIAGNOSIS — I10 Essential (primary) hypertension: Secondary | ICD-10-CM

## 2012-07-08 DIAGNOSIS — J029 Acute pharyngitis, unspecified: Secondary | ICD-10-CM

## 2012-07-08 DIAGNOSIS — C099 Malignant neoplasm of tonsil, unspecified: Secondary | ICD-10-CM

## 2012-07-08 DIAGNOSIS — K219 Gastro-esophageal reflux disease without esophagitis: Secondary | ICD-10-CM

## 2012-07-08 DIAGNOSIS — E7800 Pure hypercholesterolemia, unspecified: Secondary | ICD-10-CM

## 2012-07-08 DIAGNOSIS — R1313 Dysphagia, pharyngeal phase: Secondary | ICD-10-CM

## 2012-07-08 DIAGNOSIS — J13 Pneumonia due to Streptococcus pneumoniae: Secondary | ICD-10-CM

## 2012-07-08 DIAGNOSIS — N179 Acute kidney failure, unspecified: Secondary | ICD-10-CM

## 2012-07-08 HISTORY — PX: PORT-A-CATH REMOVAL: SHX5289

## 2012-07-08 SURGERY — REMOVAL PORT-A-CATH
Anesthesia: Monitor Anesthesia Care | Site: Chest | Wound class: Clean

## 2012-07-08 MED ORDER — CEFAZOLIN SODIUM-DEXTROSE 2-3 GM-% IV SOLR
INTRAVENOUS | Status: DC | PRN
  Administered 2012-07-08: 2 g via INTRAVENOUS

## 2012-07-08 MED ORDER — PROPOFOL 10 MG/ML IV EMUL
INTRAVENOUS | Status: AC
  Filled 2012-07-08: qty 20

## 2012-07-08 MED ORDER — LACTATED RINGERS IV SOLN
INTRAVENOUS | Status: DC | PRN
  Administered 2012-07-08: 08:00:00 via INTRAVENOUS

## 2012-07-08 MED ORDER — FENTANYL CITRATE 0.05 MG/ML IJ SOLN
INTRAMUSCULAR | Status: AC
  Filled 2012-07-08: qty 2

## 2012-07-08 MED ORDER — BACITRACIN ZINC 500 UNIT/GM EX OINT
TOPICAL_OINTMENT | CUTANEOUS | Status: AC
  Filled 2012-07-08: qty 0.9

## 2012-07-08 MED ORDER — FENTANYL CITRATE 0.05 MG/ML IJ SOLN
INTRAMUSCULAR | Status: DC | PRN
  Administered 2012-07-08 (×2): 50 ug via INTRAVENOUS

## 2012-07-08 MED ORDER — ONDANSETRON HCL 4 MG/2ML IJ SOLN: 4.0000 mg | Freq: Once | INTRAMUSCULAR | Status: DC | PRN

## 2012-07-08 MED ORDER — MIDAZOLAM HCL 2 MG/2ML IJ SOLN
1.0000 mg | INTRAMUSCULAR | Status: DC | PRN
  Administered 2012-07-08: 2 mg via INTRAVENOUS

## 2012-07-08 MED ORDER — STERILE WATER FOR IRRIGATION IR SOLN
Status: DC | PRN
  Administered 2012-07-08: 2000 mL

## 2012-07-08 MED ORDER — MIDAZOLAM HCL 2 MG/2ML IJ SOLN
INTRAMUSCULAR | Status: AC
  Filled 2012-07-08: qty 2

## 2012-07-08 MED ORDER — SODIUM CHLORIDE 0.9 % IR SOLN
Status: DC | PRN
  Administered 2012-07-08: 1000 mL

## 2012-07-08 MED ORDER — CEFAZOLIN SODIUM-DEXTROSE 2-3 GM-% IV SOLR
INTRAVENOUS | Status: AC
  Filled 2012-07-08: qty 50

## 2012-07-08 MED ORDER — FENTANYL CITRATE 0.05 MG/ML IJ SOLN: 25.0000 ug | INTRAMUSCULAR | Status: DC | PRN

## 2012-07-08 MED ORDER — BACITRACIN-NEOMYCIN-POLYMYXIN 400-5-5000 EX OINT
TOPICAL_OINTMENT | CUTANEOUS | Status: DC | PRN
  Administered 2012-07-08: 1 via TOPICAL

## 2012-07-08 MED ORDER — LIDOCAINE HCL (PF) 1 % IJ SOLN
INTRAMUSCULAR | Status: AC
  Filled 2012-07-08: qty 5

## 2012-07-08 MED ORDER — ARTIFICIAL TEARS OP OINT
TOPICAL_OINTMENT | OPHTHALMIC | Status: AC
  Filled 2012-07-08: qty 3.5

## 2012-07-08 MED ORDER — LIDOCAINE HCL (PF) 1 % IJ SOLN
INTRAMUSCULAR | Status: AC
  Filled 2012-07-08: qty 30

## 2012-07-08 MED ORDER — LACTATED RINGERS IV SOLN
INTRAVENOUS | Status: DC
  Administered 2012-07-08: 09:00:00 via INTRAVENOUS

## 2012-07-08 MED ORDER — LIDOCAINE HCL (PF) 1 % IJ SOLN
INTRAMUSCULAR | Status: DC | PRN
  Administered 2012-07-08: 5 mL

## 2012-07-08 MED ORDER — CEFAZOLIN SODIUM-DEXTROSE 2-3 GM-% IV SOLR: 2.0000 g | INTRAVENOUS | Status: DC

## 2012-07-08 SURGICAL SUPPLY — 45 items
BAG HAMPER (MISCELLANEOUS) ×2 IMPLANT
BLADE SURG 15 STRL LF DISP TIS (BLADE) ×2 IMPLANT
BLADE SURG 15 STRL SS (BLADE) ×8
CLEANER TIP ELECTROSURG 2X2 (MISCELLANEOUS) ×1 IMPLANT
CLOTH BEACON ORANGE TIMEOUT ST (SAFETY) ×2 IMPLANT
COVER LIGHT HANDLE STERIS (MISCELLANEOUS) ×4 IMPLANT
DRAPE LAPAROTOMY TRNSV 102X78 (DRAPE) ×2 IMPLANT
DRSG TEGADERM 2-3/8X2-3/4 SM (GAUZE/BANDAGES/DRESSINGS) ×2 IMPLANT
ELECT REM PT RETURN 9FT ADLT (ELECTROSURGICAL) ×2
ELECTRODE REM PT RTRN 9FT ADLT (ELECTROSURGICAL) ×1 IMPLANT
GAUZE SPONGE 4X4 16PLY XRAY LF (GAUZE/BANDAGES/DRESSINGS) ×2 IMPLANT
GLOVE BIOGEL PI IND STRL 7.0 (GLOVE) IMPLANT
GLOVE BIOGEL PI INDICATOR 7.0 (GLOVE) ×1
GLOVE EXAM NITRILE MD LF STRL (GLOVE) ×1 IMPLANT
GLOVE SKINSENSE NS SZ7.0 (GLOVE) ×1
GLOVE SKINSENSE STRL SZ7.0 (GLOVE) ×1 IMPLANT
GLOVE SS BIOGEL STRL SZ 6.5 (GLOVE) IMPLANT
GLOVE SUPERSENSE BIOGEL SZ 6.5 (GLOVE) ×1
GOWN STRL REIN XL XLG (GOWN DISPOSABLE) ×4 IMPLANT
KIT BLADEGUARD II DBL (SET/KITS/TRAYS/PACK) ×2 IMPLANT
KIT ROOM TURNOVER APOR (KITS) ×2 IMPLANT
MANIFOLD NEPTUNE II (INSTRUMENTS) ×2 IMPLANT
MARKER SKIN DUAL TIP RULER LAB (MISCELLANEOUS) ×2 IMPLANT
NDL HYPO 25X1 1.5 SAFETY (NEEDLE) ×1 IMPLANT
NEEDLE HYPO 25X1 1.5 SAFETY (NEEDLE) ×2 IMPLANT
NS IRRIG 1000ML POUR BTL (IV SOLUTION) ×2 IMPLANT
PACK BASIC III (CUSTOM PROCEDURE TRAY) ×2
PACK SRG BSC III STRL LF ECLPS (CUSTOM PROCEDURE TRAY) ×1 IMPLANT
PAD ARMBOARD 7.5X6 YLW CONV (MISCELLANEOUS) ×2 IMPLANT
PENCIL HANDSWITCHING (ELECTRODE) ×1 IMPLANT
SET BASIN LINEN APH (SET/KITS/TRAYS/PACK) ×2 IMPLANT
SOL PREP PROV IODINE SCRUB 4OZ (MISCELLANEOUS) ×2 IMPLANT
SPONGE GAUZE 2X2 8PLY STRL LF (GAUZE/BANDAGES/DRESSINGS) ×2 IMPLANT
STRIP CLOSURE SKIN 1/4X3 (GAUZE/BANDAGES/DRESSINGS) ×2 IMPLANT
SUT ETHILON 4 0 PS 2 18 (SUTURE) ×1 IMPLANT
SUT VIC AB 3-0 SH 27 (SUTURE) ×2
SUT VIC AB 3-0 SH 27X BRD (SUTURE) IMPLANT
SUT VIC AB 4-0 PS2 27 (SUTURE) ×1 IMPLANT
SUT VIC AB 5-0 P-3 18X BRD (SUTURE) ×1 IMPLANT
SUT VIC AB 5-0 P3 18 (SUTURE) ×2
SUT VICRYL AB 3 0 TIES (SUTURE) IMPLANT
SYR BULB IRRIGATION 50ML (SYRINGE) ×2 IMPLANT
SYR CONTROL 10ML LL (SYRINGE) ×2 IMPLANT
WATER STERILE IRR 1000ML POUR (IV SOLUTION) ×4 IMPLANT
YANKAUER SUCT 12FT TUBE ARGYLE (SUCTIONS) ×2 IMPLANT

## 2012-07-08 NOTE — Progress Notes (Signed)
Dr Darrick Penna at bedside. G-tube removed per Dr Darrick Penna. No drainage. Drsg to site. Tolerated well.

## 2012-07-08 NOTE — Anesthesia Preprocedure Evaluation (Signed)
Anesthesia Evaluation  Patient identified by MRN, date of birth, ID band Patient awake    Reviewed: Allergy & Precautions, H&P , NPO status , Patient's Chart, lab work & pertinent test results  Airway Mallampati: IV TM Distance: >3 FB Neck ROM: Limited  Mouth opening: Limited Mouth Opening  Dental  (+) Edentulous Upper and Edentulous Lower   Pulmonary neg pulmonary ROS, sleep apnea ,  Chronic hoarsenes, no stridor. breath sounds clear to auscultation  Pulmonary exam normal       Cardiovascular hypertension, Pt. on medications negative cardio ROS  Rhythm:Regular Rate:Normal     Neuro/Psych negative neurological ROS  negative psych ROS   GI/Hepatic negative GI ROS, Neg liver ROS, GERD-  Medicated,Sq Cell CA of tonsil   Endo/Other  negative endocrine ROS  Renal/GU Renal InsufficiencyRenal diseasenegative Renal ROS  negative genitourinary   Musculoskeletal negative musculoskeletal ROS (+)   Abdominal Normal abdominal exam  (+)   Peds  Hematology negative hematology ROS (+)   Anesthesia Other Findings   Reproductive/Obstetrics negative OB ROS                           Anesthesia Physical Anesthesia Plan  ASA: III  Anesthesia Plan: MAC   Post-op Pain Management:    Induction: Intravenous  Airway Management Planned: Nasal Cannula  Additional Equipment:   Intra-op Plan:   Post-operative Plan:   Informed Consent: I have reviewed the patients History and Physical, chart, labs and discussed the procedure including the risks, benefits and alternatives for the proposed anesthesia with the patient or authorized representative who has indicated his/her understanding and acceptance.     Plan Discussed with:   Anesthesia Plan Comments:         Anesthesia Quick Evaluation

## 2012-07-08 NOTE — Anesthesia Procedure Notes (Signed)
Procedure Name: MAC Performed by: ANDRAZA, AMY L Pre-anesthesia Checklist: Patient identified, Patient being monitored, Emergency Drugs available, Timeout performed and Suction available Oxygen Delivery Method: Nasal cannula     

## 2012-07-08 NOTE — Transfer of Care (Signed)
  Anesthesia Post-op Note  Patient: Austin Mathis  Procedure(s) Performed: Procedure(s) (LRB) with comments: REMOVAL PORT-A-CATH (N/A)  Patient Location: PACU  Anesthesia Type: MAC  Level of Consciousness: awake, alert , oriented and patient cooperative  Airway and Oxygen Therapy: Patient Spontanous Breathing and Patient connected nasal cannula  Post-op Pain: mild  Post-op Assessment: Post-op Vital signs reviewed, Patient's Cardiovascular Status Stable, Respiratory Function Stable, Patent Airway and No signs of Nausea or vomiting  Post-op Vital Signs: Reviewed and stable  Complications: No apparent anesthesia complications

## 2012-07-08 NOTE — Progress Notes (Signed)
12 yr. Old male S/P chemo and radiation treatment for squamous cell CA of the Tonsil for removal of porta cath.  Discussed pre op with oncology service.  Procedure and risks and benefits discussed in detail and in Spanish with pt. And family and informed consent obtained.  Labs reviewed. Filed Vitals:   07/08/12 0915  BP: 122/78  Temp:   Resp: 19  temp.98.2  No clinical change in h&p.  DICT. # [id number].

## 2012-07-08 NOTE — Progress Notes (Signed)
Dr Darrick Penna will be at bedside to remove G-tube. Pt awake. Voices no c/o at this time.

## 2012-07-08 NOTE — Anesthesia Postprocedure Evaluation (Signed)
  Anesthesia Post-op Note  Patient: Austin Mathis  Procedure(s) Performed: Procedure(s) (LRB) with comments: REMOVAL PORT-A-CATH (N/A)  Patient Location: PACU  Anesthesia Type: MAC  Level of Consciousness: awake, alert , oriented and patient cooperative  Airway and Oxygen Therapy: Patient Spontanous Breathing and Patient connected to nasal cannula  mask oxygen Post-op Pain: mild  Post-op Assessment: Post-op Vital signs reviewed, Patient's Cardiovascular Status Stable, Respiratory Function Stable, Patent Airway and No signs of Nausea or vomiting  Post-op Vital Signs: Reviewed and stable  Complications: No apparent anesthesia complications

## 2012-07-08 NOTE — Telephone Encounter (Signed)
PT SEEN IN OR TODAY FOR PORT-A-CATH REMOVAL DR. BRADFORD, PT, & FAMILY REQUESTED PEG BE REMOVED TODAY.  PROCEDURE TECHBNIQUE. TRACTION APPLIED TO SITE/ PEG REMOVED. SML AMOUNT OF BLEEDING. DRY GAUZE APPLIED WITH PAPER TAPE.  PT MAY TAKE A SHOWER TOMORROW. NO BATH FOR ONE WEEK.  NO NEED FOR OPV TODAY.

## 2012-07-08 NOTE — Preoperative (Signed)
Beta Blockers   Reason not to administer Beta Blockers:Not Applicable 

## 2012-07-08 NOTE — Progress Notes (Signed)
Awake. Denies pain. Swallowing without difficulty. drsg to lt subclavian dry/intact. drsg to abd dry/intact. Voices no c/o at this time.

## 2012-07-08 NOTE — Brief Op Note (Signed)
07/08/2012  10:22 AM  PATIENT:  [name]  71 y.o. male  PRE-OPERATIVE DIAGNOSIS:  tonsil cancer  POST-OPERATIVE DIAGNOSIS:  tonsil cancer  PROCEDURE:  Procedure(s) (LRB) with comments: REMOVAL PORT-A-CATH (N/A)  SURGEON:  Surgeon(s) and Role:    * Marlane Hatcher, MD - Primary  PHYSICIAN ASSISTANT:   ASSISTANTS: none   ANESTHESIA:   IV sedation  EBL:  Total I/O In: 500 [I.V.:500] Out: 5 [Blood:5]  BLOOD ADMINISTERED:none  DRAINS: none   LOCAL MEDICATIONS USED:  XYLOCAINE   SPECIMEN:  No Specimen  DISPOSITION OF SPECIMEN:  N/A  COUNTS:  YES  TOURNIQUET:  * No tourniquets in log *  DICTATION: .Other Dictation: Dictation Number OR Dict. # [id number].  PLAN OF CARE: Discharge to home after PACU  PATIENT DISPOSITION:  PACU - hemodynamically stable.   Delay start of Pharmacological VTE agent (>24hrs) due to surgical blood loss or risk of bleeding: not applicable

## 2012-07-09 NOTE — Op Note (Signed)
NAMEMarland Kitchen  Austin Mathis, Austin Mathis NO.:  [id number]  MEDICAL RECORD NO.:  [id number]  LOCATION:                                 FACILITY:  PHYSICIAN:  Barbaraann Barthel, M.D. 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  PROCEDURE:  Removal of left subclavian Port-A-Cath.  WOUND CLASSIFICATION:  Clean.  SPECIMEN:  None.  Note this is a 71 year old [place] male who was diagnosed with cancer of the tonsil.  He has completed radiation and chemotherapy.  I discussed the need for removing this Port-A-Cath with Dr. Mariel Sleet, who wanted this removed and we planned to do this via the outpatient department.  GROSS OPERATIVE FINDINGS:  The patient had a pseudocapsule around the Port-A-Cath.  There were no other problems or any other abnormalities.  TECHNIQUE:  The patient was placed in supine position.  After some IV sedation, his left hemithorax was prepped in the usual manner and draped and an incision was carried out over the infusion device after anesthetizing this with 1% Xylocaine without epinephrine.  We removed the Port-A-Cath.  We ligated the area where the silastic catheter exited with 3-0 Polysorb.  We then irrigated with normal saline solution and closed the skin with 4-0 nylon interrupted mattress sutures.  A 2 x 2 Neosporin ointment was applied with an OpSite dressing.  Prior to closure, all sponge, needle, and instrument counts found to be correct. Estimated blood loss was minimal.  The patient had some minimal amount of crystalloids infused intraoperatively.  There were no complications.  Note I informed Dr. Darrick Penna of the patient's request to have his feeding tube removed and she will hopefully be able to take advantage of that during this patient's visit to the outpatient  department.  This patient will then follow up as needed with Dr. Mariel Sleet.  I will remove the stitches in approximately a week.     Barbaraann Barthel, M.D.     WB/MEDQ  D:  07/08/2012  T:  07/08/2012  Job:  [id number]  cc:   Ladona Horns. Mariel Sleet, MD Fax: [phone]  Dr. Darrick Penna

## 2012-07-11 ENCOUNTER — Encounter (HOSPITAL_COMMUNITY): Payer: Self-pay | Admitting: General Surgery

## 2012-08-05 ENCOUNTER — Encounter: Payer: Self-pay | Admitting: *Deleted

## 2012-10-10 IMAGING — CR DG CHEST 2V
2 series · 2 of 2 positions shown · non-contrast
Comparison: 03/29/2012

CLINICAL DATA: Pneumonia

CHEST - 2 VIEW

[view not recorded (1 of 2)]
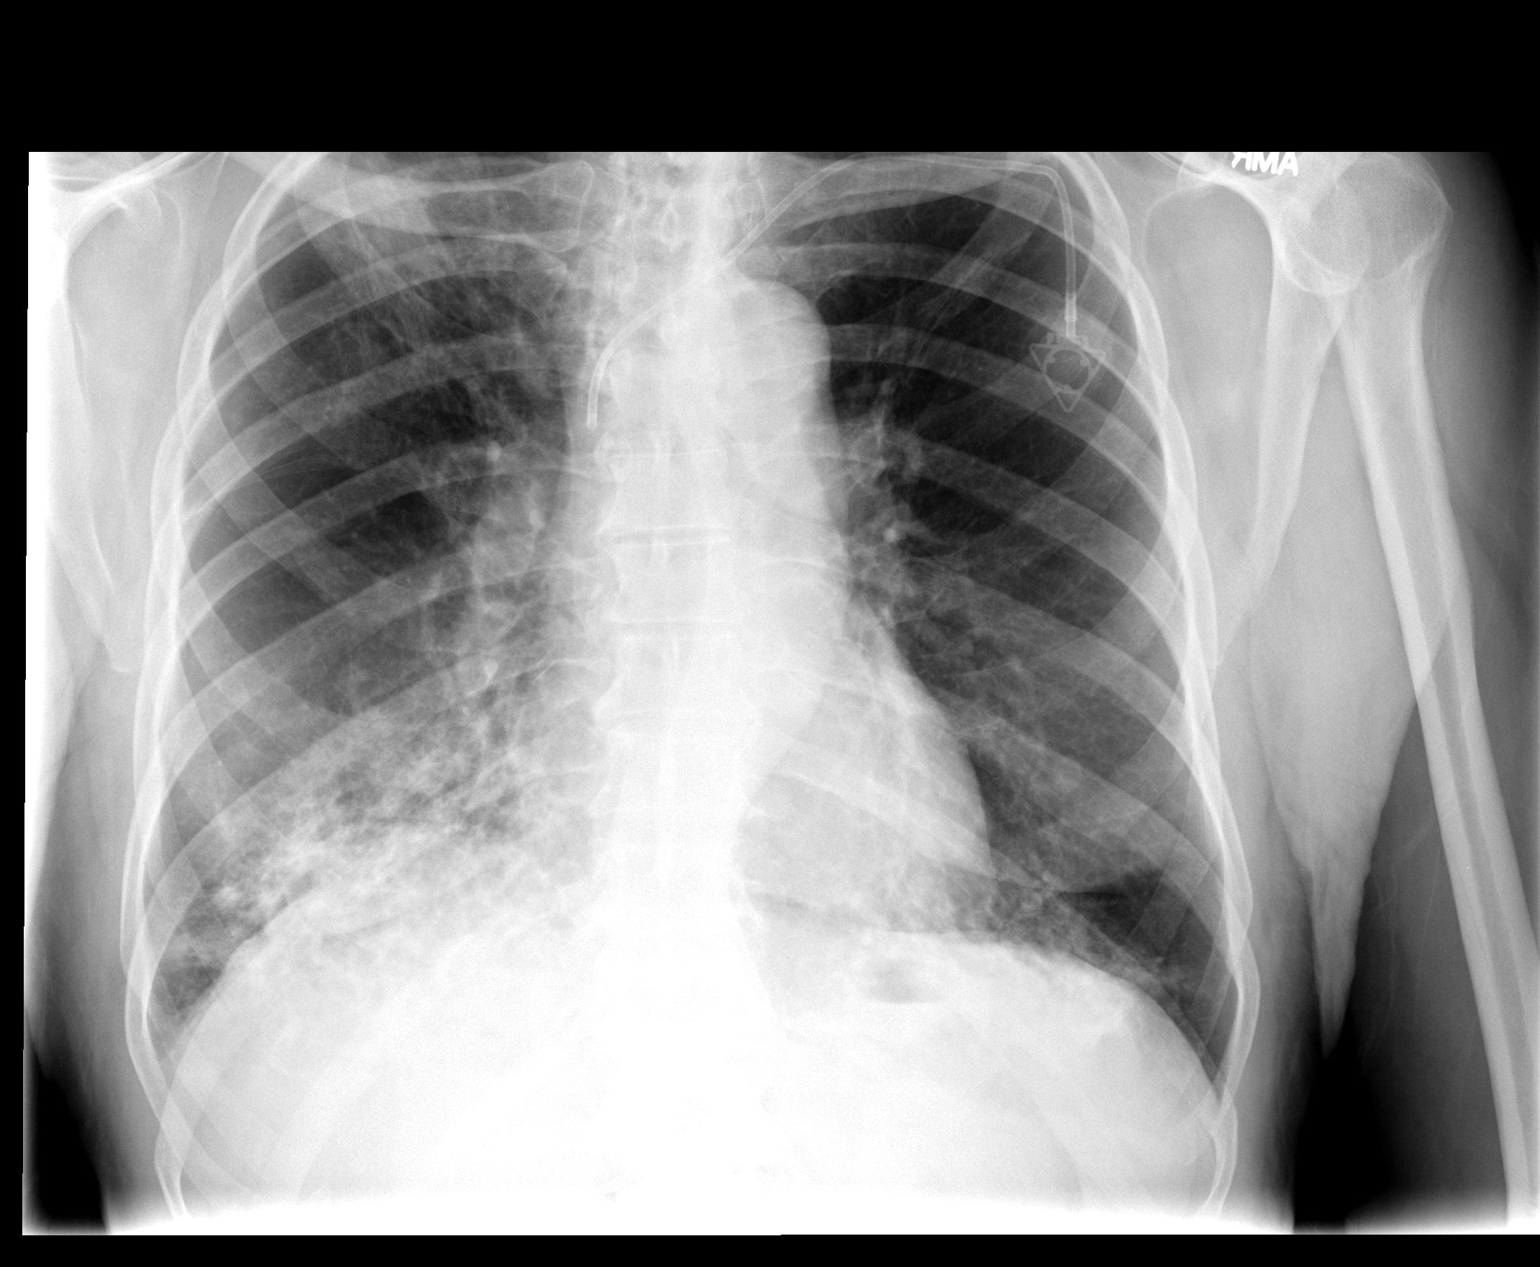

[view not recorded (2 of 2)]
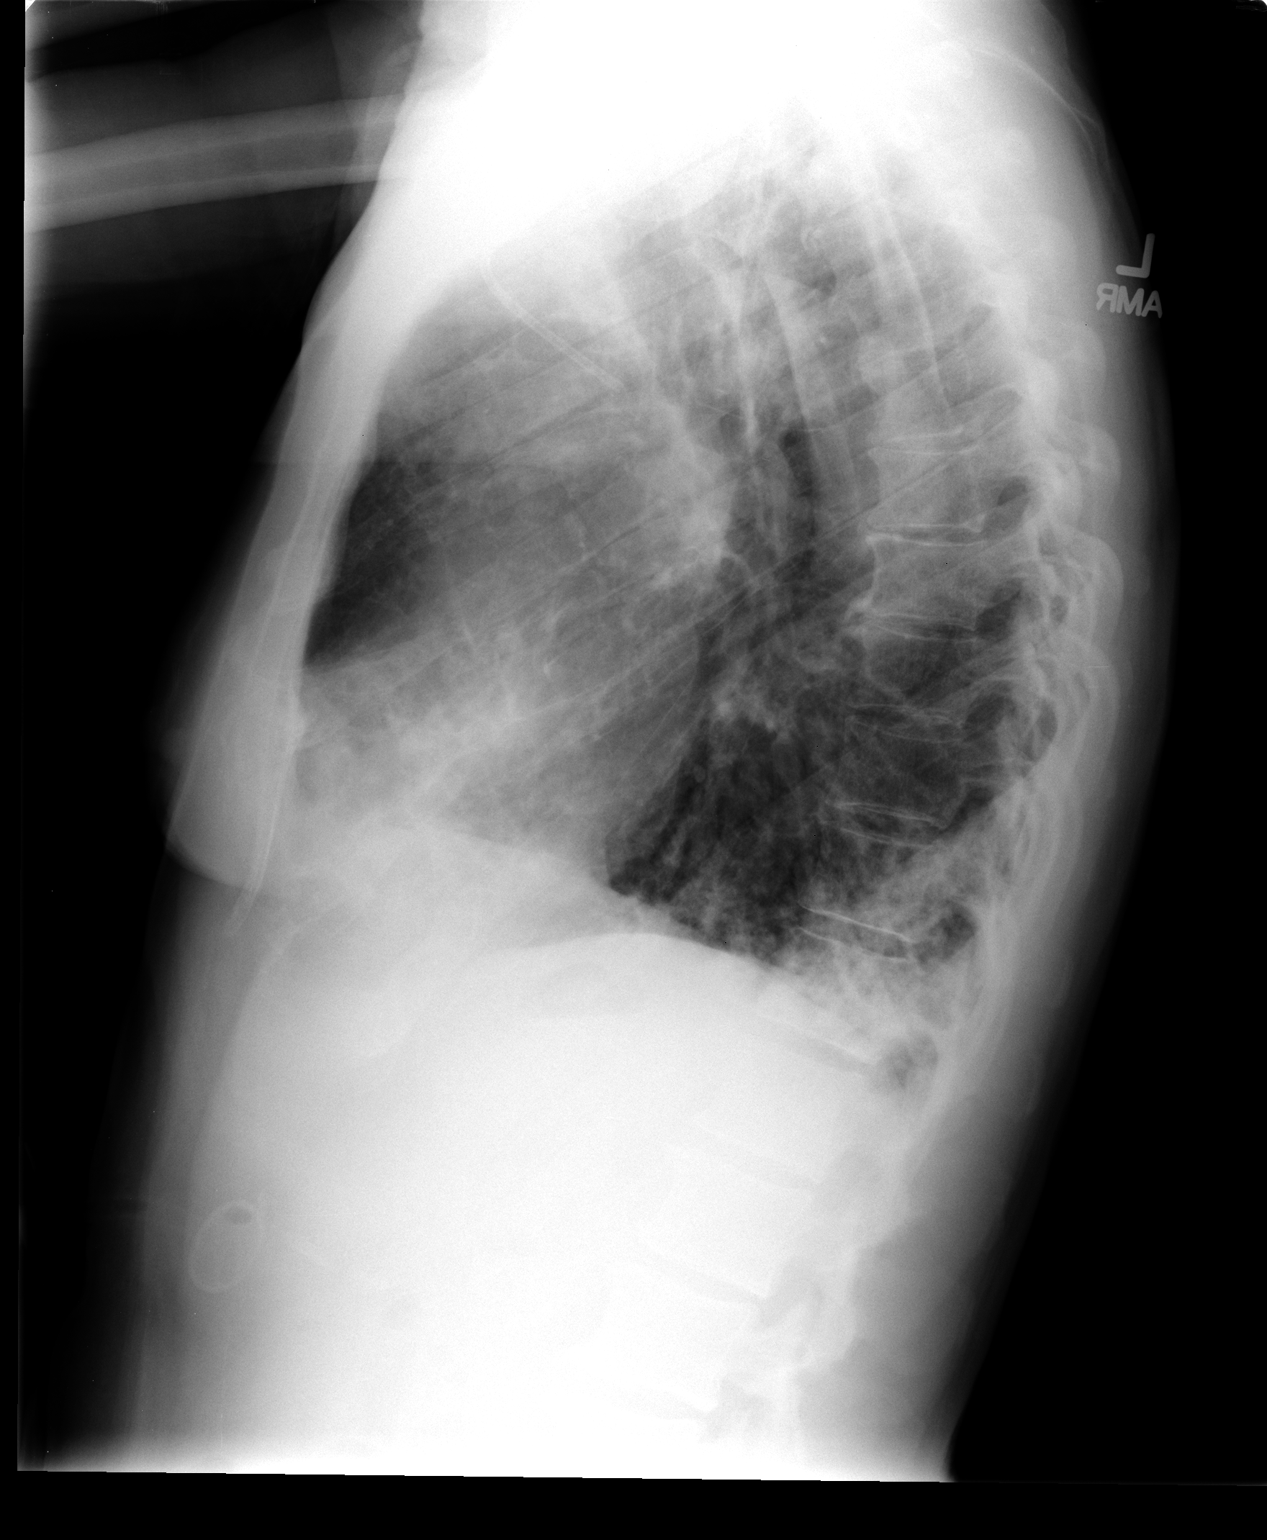

[2 of 2 positions shown; findings below may reference images not displayed]

FINDINGS: The cardiac shadow is stable.  There are left-sided chest
wall port is again seen with catheter tip in the mid superior vena
cava.  No persistent changes in the right lung base which appears
slightly worsened in the interval from prior exam.  Similar changes
are now seen in the left lung base with improvement in the aeration
of the right upper lobe.
IMPRESSION: Waxing and waning infiltrates as described above particularly noted
in the bases.

## 2012-11-05 ENCOUNTER — Ambulatory Visit (HOSPITAL_COMMUNITY): Payer: Federal, State, Local not specified - PPO | Admitting: Oncology

## 2012-11-19 ENCOUNTER — Encounter: Payer: Self-pay | Admitting: *Deleted

## 2012-11-19 ENCOUNTER — Telehealth: Payer: Self-pay | Admitting: Family Medicine

## 2012-11-19 NOTE — Telephone Encounter (Signed)
pravachol 40mg  #30 i po qd called to [facility]. Pt's daughter notified.

## 2012-11-19 NOTE — Telephone Encounter (Signed)
Patient needs a refill of [facility] [place].  He is leaving for [place] in the next 2 hours and would like it called in before then.

## 2012-11-26 ENCOUNTER — Encounter: Payer: Self-pay | Admitting: Family Medicine

## 2012-11-26 ENCOUNTER — Ambulatory Visit (INDEPENDENT_AMBULATORY_CARE_PROVIDER_SITE_OTHER): Payer: Federal, State, Local not specified - PPO | Admitting: Family Medicine

## 2012-11-26 VITALS — BP 112/77 | HR 70 | Wt 141.2 lb

## 2012-11-26 DIAGNOSIS — E7800 Pure hypercholesterolemia, unspecified: Secondary | ICD-10-CM

## 2012-11-26 DIAGNOSIS — N179 Acute kidney failure, unspecified: Secondary | ICD-10-CM

## 2012-11-26 DIAGNOSIS — I10 Essential (primary) hypertension: Secondary | ICD-10-CM

## 2012-11-26 MED ORDER — PRAVASTATIN SODIUM 40 MG PO TABS: 40.0000 mg | ORAL_TABLET | Freq: Every day | ORAL | Status: DC

## 2012-11-26 NOTE — Progress Notes (Signed)
  Subjective:    Patient ID: Austin Mathis, male    DOB: 01-16-1941, 72 y.o.   MRN: [id number]  HPI Patient comes in today several different issues are going on. He denies any chest tightness pressure pain shortness of breath. Patient does not smoke. Does not use alcohol. Has history hypertension hypertension hyperlipidemia also glucose intolerance and erectile dysfunction. He states he's taken his medicines as directed.he relates that omeprazole does help him with reflux. In addition to this he also relates that he sees the cancer doctor for his squamous cell cancer of the neck which is overall doing very well currently. He does try to follow a healthy diet he does do some activity and exercise. He is not interested in colonoscopy or prostate exam. Past medical history reviewed social history reviewed. Family history noncontributory.   Review of Systems denies chest pressure shortness of breath vomiting diarrhea swelling in the legs.      Objective:   Physical Exam Vital signs are noted. Lungs are clear. No crackles. Heart is regular. Pulse normal. Extremities no edema. Abdomen soft. Neck no masses.       Assessment & Plan:  Neck cancer-follow through with oncology Hyperlipidemia continue medication check lab work. Hypertension good control continue current measures check lab work Reflux-omeprazole daily follow a healthy diet stay active Preventative treatments recommended patient defers. Followup 6 months sooner problems.

## 2012-11-27 ENCOUNTER — Encounter: Payer: Self-pay | Admitting: Family Medicine

## 2012-11-27 LAB — HEPATIC FUNCTION PANEL
AST: 12 U/L (ref 0–37)
Total Bilirubin: 0.5 mg/dL (ref 0.3–1.2)
Total Protein: 7.3 g/dL (ref 6.0–8.3)

## 2012-11-27 LAB — BASIC METABOLIC PANEL
CO2: 30 mEq/L (ref 19–32)
Calcium: 9.6 mg/dL (ref 8.4–10.5)
Chloride: 100 mEq/L (ref 96–112)
Potassium: 4 mEq/L (ref 3.5–5.3)
Sodium: 141 mEq/L (ref 135–145)

## 2012-11-27 LAB — LIPID PANEL
HDL: 64 mg/dL (ref >39)
LDL Cholesterol: 146 mg/dL — ABNORMAL HIGH (ref 0–99)
Total CHOL/HDL Ratio: 3.5 Ratio
VLDL: 16 mg/dL (ref 0–40)

## 2012-12-13 IMAGING — PT NM PET TUM IMG RESTAG (PS) SKULL BASE T - THIGH
6 series · 25 of 25 positions shown · non-contrast
Comparison: PET CT scan 11/23/2011

CLINICAL DATA: Subsequent treatment strategy for head and neck
cancer. Patient status post chemotherapy and radiation therapy for
squamous cell carcinoma of the tonsil.

NUCLEAR MEDICINE PET SKULL BASE TO THIGH
Fasting Blood Glucose:  91
TECHNIQUE: 19.0 mCi F-18 FDG was injected intravenously. CT data
was obtained and used for attenuation correction and anatomic
localization only.  (This was not acquired as a diagnostic CT
examination.) Additional exam technical data entered on
technologist worksheet.

[Series 1: pet ac · axial · 3.3mm · 4.69mm/px · z∈[-870,+0]mm · 5 of 267 slices shown]
[im 1/267]
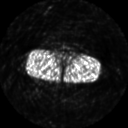
[im 67/267]
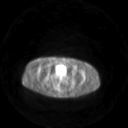
[im 134/267]
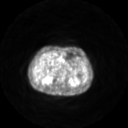
[im 200/267]
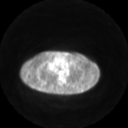
[im 267/267]
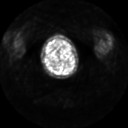

[Series 2: pet nac · axial · 3.3mm · 4.69mm/px · z∈[-870,+0]mm · 6 of 267 slices shown]
[im 1/267]
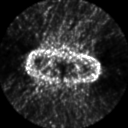
[im 54/267]
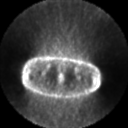
[im 107/267]
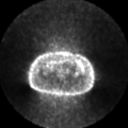
[im 160/267]
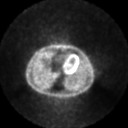
[im 213/267]
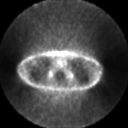
[im 267/267]
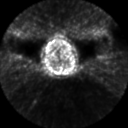

[Series 2: ct images · axial · 3.8mm · 0.98mm/px · z∈[-870,+0]mm · 6 of 267 slices shown]
[im 1/267]
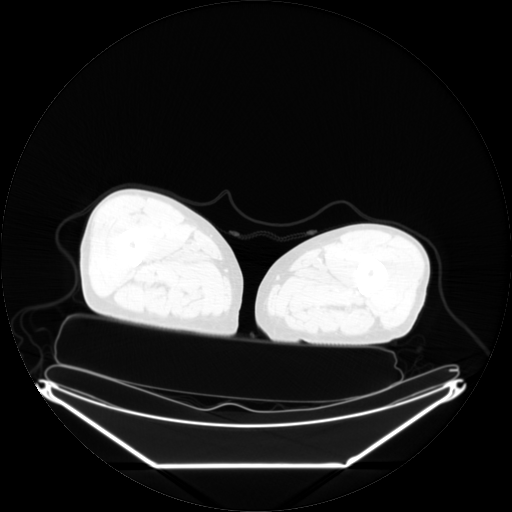
[im 54/267]
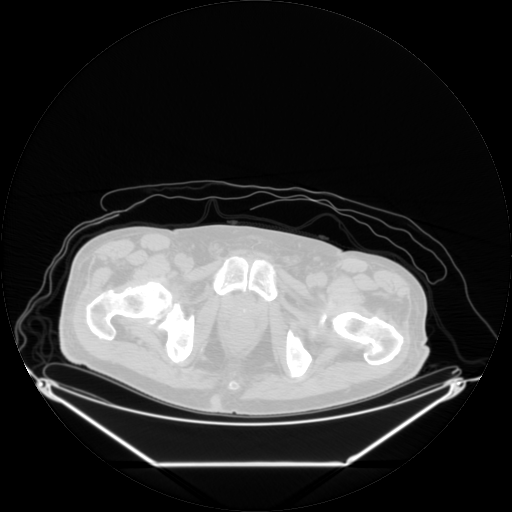
[im 107/267]
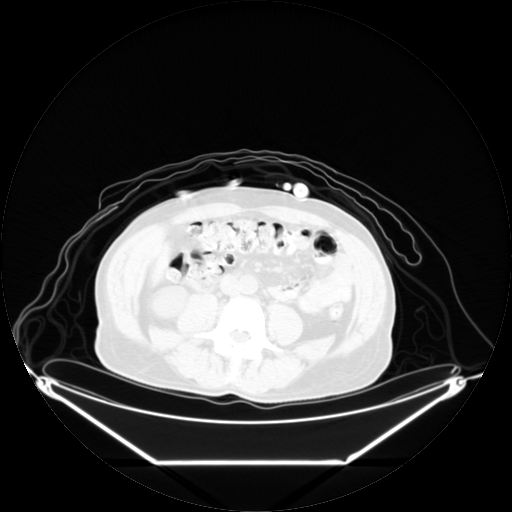
[im 160/267]
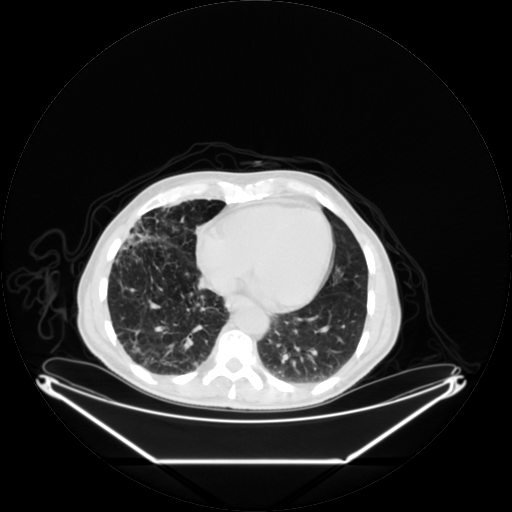
[im 213/267]
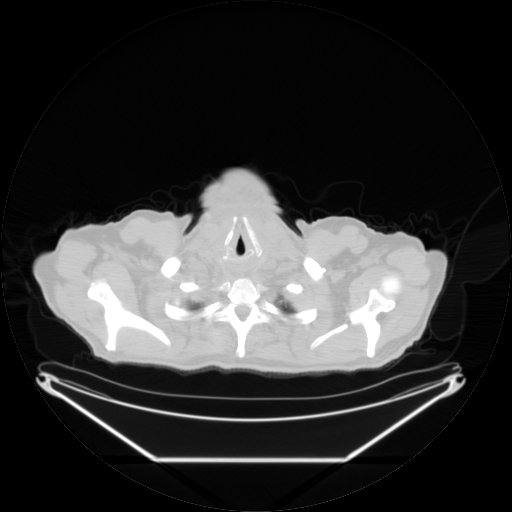
[im 267/267  brain]
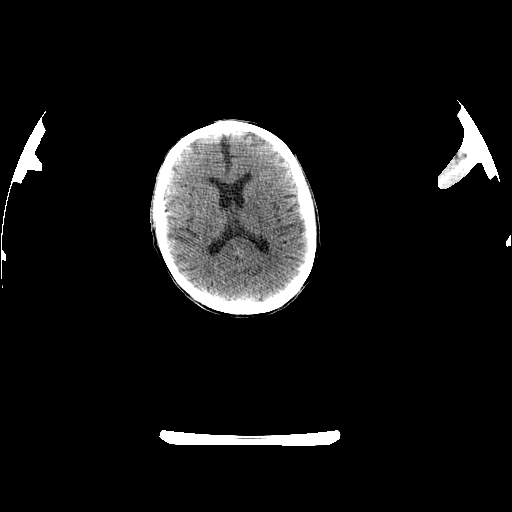

[Series 123: mip · coronal · 3.3mm · 4.69mm/px · 1 of 30 slices shown]
[im 1/30]
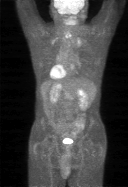

[Series 151: reformatted · axial · 3.3mm · 3.91mm/px · z∈[-870,+0]mm · 6 of 265 slices shown (1 of 2)]
[im 1/265]
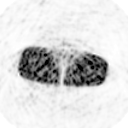
[im 53/265]
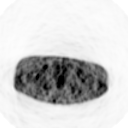
[im 106/265]
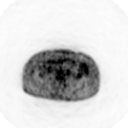
[im 159/265]
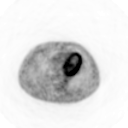
[im 212/265]
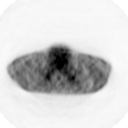
[im 265/265]
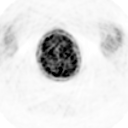

[Series 153: reformatted · coronal · 4.7mm · 6.98mm/px · 1 of 68 slices shown (2 of 2)]
[im 1/68]
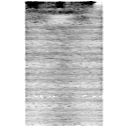

[25 of 25 positions shown; findings below may reference images not displayed]

FINDINGS: Neck: Marked reduction in the intense hypermetabolic mass within
the right hypopharynx.  There is no persistent asymmetric increased
metabolic activity in the hypopharynx or tonsillar region.  There
is focal activity  posterior to the arytenoid cartilage which is
felt to represent physiologic activity in muscle (image 57).
Likewise there is interval resolution of hypermetabolic activity
within the left and right cervical lymph node with no remaining
enlarged or hypermetabolic cervical nodes.

Chest:  There is new hypermetabolic activity associated with the
upper lobe consolidation in the left and right which is felt to
represents  post radiation change.  No hypermetabolic mediastinal
lymph nodes.  No suspicious pulmonary nodules.

Abdomen/Pelvis:  Percutaneous gastrostomy tube in the stomach.  No
hypermetabolic lymphadenopathy.  No abnormal active within the
liver.

Skeleton:  No focal hypermetabolic activity to suggest skeletal
metastasis.
IMPRESSION: 1. No evidence of residual active tumor within the right tonsillar
region.
2.  No evidence of residual active nodal metastasis in the neck.
3.  No evidence of this metastasis in the chest ,abdomen, or
pelvis.
4.  New hypermetabolic consolidation in the left and right lung
apex consistent with postradiation change.

## 2012-12-24 ENCOUNTER — Other Ambulatory Visit: Payer: Self-pay | Admitting: Family Medicine

## 2012-12-25 ENCOUNTER — Encounter (HOSPITAL_COMMUNITY): Payer: Self-pay | Admitting: Oncology

## 2012-12-25 ENCOUNTER — Encounter (HOSPITAL_COMMUNITY): Payer: Federal, State, Local not specified - PPO | Attending: Oncology | Admitting: Oncology

## 2012-12-25 VITALS — BP 123/73 | HR 74 | Temp 98.1°F | Resp 18 | Wt 137.8 lb

## 2012-12-25 DIAGNOSIS — Z09 Encounter for follow-up examination after completed treatment for conditions other than malignant neoplasm: Secondary | ICD-10-CM | POA: Insufficient documentation

## 2012-12-25 DIAGNOSIS — C099 Malignant neoplasm of tonsil, unspecified: Secondary | ICD-10-CM

## 2012-12-25 DIAGNOSIS — B977 Papillomavirus as the cause of diseases classified elsewhere: Secondary | ICD-10-CM

## 2012-12-25 DIAGNOSIS — Z85819 Personal history of malignant neoplasm of unspecified site of lip, oral cavity, and pharynx: Secondary | ICD-10-CM | POA: Insufficient documentation

## 2012-12-25 LAB — CBC WITH DIFFERENTIAL/PLATELET
Basophils Relative: 0 % (ref 0–1)
Eosinophils Absolute: 0.1 10*3/uL (ref 0.0–0.7)
Eosinophils Relative: 1 % (ref 0–5)
Hemoglobin: 10.9 g/dL — ABNORMAL LOW (ref 13.0–17.0)
Lymphs Abs: 1.1 10*3/uL (ref 0.7–4.0)
MCH: 32.2 pg (ref 26.0–34.0)
MCHC: 33.6 g/dL (ref 30.0–36.0)
MCV: 95.9 fL (ref 78.0–100.0)
Monocytes Relative: 7 % (ref 3–12)
Neutrophils Relative %: 83 % — ABNORMAL HIGH (ref 43–77)
Platelets: 343 10*3/uL (ref 150–400)
RBC: 3.38 MIL/uL — ABNORMAL LOW (ref 4.22–5.81)

## 2012-12-25 NOTE — Progress Notes (Signed)
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 He is status post combined radiation and chemotherapy with complete remission. His PET scan on 06/18/2012 post therapy showed no evidence of persistence of disease.  He has some fatigue and weakness still and decrease in sense of taste especially for sweets. He has lost a few more pounds. We need to check his TSH level her blood counts today. He had blood work by his primary care physician in May. I would not repeat those tests.  His daughter is with him today. The patient has moved to [place] for permanent residence. He will be coming back here for followup.  BP 123/73  Pulse 74  Temp(Src) 98.1 F (36.7 C) (Oral)  Resp 18  Wt 137 lb 12.8 oz (62.506 kg)  BMI 21.58 kg/m2  He is clearly no acute distress. He has upper and lower dentures of course. I do not see anything within the oral pharynx. Throat is clear. He has somewhat of a dry mouth. He has no palpable lymphadenopathy in the cervical or supraclavicular areas. I cannot feel his thyroid. He has no axillary lymph nodes. His lungs are clear. His heart shows a regular rhythm and rate without murmur rub gallop. He has no arm or leg edema. He does have a slight bruise over his left eye but he is not sure what this is from.  He does not need further imaging at this juncture. We do need to check his blood work. We'll see him back in 6 months now. He will need a CBC, differential, cmet and TSH at that time.

## 2012-12-25 NOTE — Patient Instructions (Addendum)
[facility] Discharge Instructions  RECOMMENDATIONS MADE BY THE CONSULTANT AND ANY TEST RESULTS WILL BE SENT TO YOUR REFERRING PHYSICIAN.  EXAM FINDINGS BY THE PHYSICIAN TODAY AND SIGNS OR SYMPTOMS TO REPORT TO CLINIC OR PRIMARY PHYSICIAN: Exam and discussion by MD.  No evidence of recurrence by exam.  Need to have thyroid checked every 6 months.  MEDICATIONS PRESCRIBED:  none  INSTRUCTIONS GIVEN AND DISCUSSED: Report any new lumps, bone pain, shortness of breath or other symptoms.  SPECIAL INSTRUCTIONS/FOLLOW-UP: Follow-up in 6 months.  Thank you for choosing [facility] to provide your oncology and hematology care.  To afford each patient quality time with our providers, please arrive at least 15 minutes before your scheduled appointment time.  With your help, our goal is to use those 15 minutes to complete the necessary work-up to ensure our physicians have the information they need to help with your evaluation and healthcare recommendations.    Effective January 1st, 2014, we ask that you re-schedule your appointment with our physicians should you arrive 10 or more minutes late for your appointment.  We strive to give you quality time with our providers, and arriving late affects you and other patients whose appointments are after yours.    Again, thank you for choosing [facility].  Our hope is that these requests will decrease the amount of time that you wait before being seen by our physicians.       _____________________________________________________________  Should you have questions after your visit to [facility], please contact our office at [phone] between the hours of 8:30 a.m. and 5:00 p.m.  Voicemails left after 4:30 p.m. will not be returned until the following business day.  For prescription refill requests, have your pharmacy contact our office with your prescription refill request.

## 2012-12-26 LAB — TSH: TSH: 105.682 u[IU]/mL — ABNORMAL HIGH (ref 0.350–4.500)

## 2012-12-27 ENCOUNTER — Encounter (HOSPITAL_COMMUNITY): Payer: Self-pay

## 2012-12-31 ENCOUNTER — Telehealth (HOSPITAL_COMMUNITY): Payer: Self-pay

## 2012-12-31 NOTE — Telephone Encounter (Signed)
Call from patient to follow-up abnormal test results.  Instructed that TSH level was high and that he needs to see his PCP.  Test results forwarded to Dr. Gerda Diss per request of patient.  Patient verbalized understanding of instructions.

## 2013-01-03 ENCOUNTER — Ambulatory Visit (INDEPENDENT_AMBULATORY_CARE_PROVIDER_SITE_OTHER): Payer: Federal, State, Local not specified - PPO | Admitting: Nurse Practitioner

## 2013-01-03 ENCOUNTER — Encounter: Payer: Self-pay | Admitting: Nurse Practitioner

## 2013-01-03 VITALS — BP 126/78 | HR 80 | Wt 138.0 lb

## 2013-01-03 DIAGNOSIS — E039 Hypothyroidism, unspecified: Secondary | ICD-10-CM

## 2013-01-03 MED ORDER — LEVOTHYROXINE SODIUM 50 MCG PO TABS: 50.0000 ug | ORAL_TABLET | Freq: Every day | ORAL | Status: DC

## 2013-01-08 ENCOUNTER — Encounter: Payer: Self-pay | Admitting: Nurse Practitioner

## 2013-01-08 DIAGNOSIS — E039 Hypothyroidism, unspecified: Secondary | ICD-10-CM | POA: Insufficient documentation

## 2013-01-08 NOTE — Assessment & Plan Note (Signed)
  Meds ordered this encounter  Medications  . levothyroxine (SYNTHROID, LEVOTHROID) 50 MCG tablet    Sig: Take 1 tablet (50 mcg total) by mouth daily before breakfast. Take on an empty stomach.    Dispense:  30 tablet    Refill:  2    Order Specific Question:  Supervising Provider    Answer:  Merlyn Albert [[id number]]   Patient is going back to [place], given prescription to repeat TSH and free T4 in 8 weeks. To send a copy to our office. Will slowly titrate thyroid medication dose due to his age.

## 2013-01-08 NOTE — Progress Notes (Signed)
Subjective:  Presents for followup after being advised by his oncologist to check with [place] regarding an elevated TSH. Patient received radiation in his neck area due to tonsillar cancer and is been told this is probably damage to the thyroid gland due to this. Denies any excessive fatigue or weight gain.  Objective:   BP 126/78  Pulse 80  Wt 138 lb (62.596 kg)  BMI 21.61 kg/m2 NAD. Alert, oriented. Lungs clear. Heart regular rate rhythm. Thyroid minimal nodularity, no masses or goiters noted. Nontender to palpation. No thyromegaly noted. TSH on 6/4 was 105.  Assessment:Hypothyroidism (acquired)  Plan: Meds ordered this encounter  Medications  . levothyroxine (SYNTHROID, LEVOTHROID) 50 MCG tablet    Sig: Take 1 tablet (50 mcg total) by mouth daily before breakfast. Take on an empty stomach.    Dispense:  30 tablet    Refill:  2    Order Specific Question:  Supervising Provider    Answer:  Merlyn Albert [[id number]]   Patient is going back to [place], given prescription to repeat TSH and free T4 in 8 weeks. To send a copy to our office. Will slowly titrate thyroid medication dose due to his age.

## 2013-01-20 ENCOUNTER — Other Ambulatory Visit (HOSPITAL_COMMUNITY): Payer: Self-pay | Admitting: Oncology

## 2013-01-20 ENCOUNTER — Telehealth (HOSPITAL_COMMUNITY): Payer: Self-pay | Admitting: Oncology

## 2013-01-20 DIAGNOSIS — K219 Gastro-esophageal reflux disease without esophagitis: Secondary | ICD-10-CM

## 2013-01-20 MED ORDER — OMEPRAZOLE 20 MG PO CPDR: 20.0000 mg | DELAYED_RELEASE_CAPSULE | Freq: Two times a day (BID) | ORAL | Status: DC

## 2013-02-12 ENCOUNTER — Telehealth: Payer: Self-pay

## 2013-02-12 NOTE — Telephone Encounter (Signed)
Please review lab work results on front of patient chart.

## 2013-02-17 ENCOUNTER — Telehealth: Payer: Self-pay | Admitting: Family Medicine

## 2013-02-17 MED ORDER — LEVOTHYROXINE SODIUM 100 MCG PO TABS: 100.0000 ug | ORAL_TABLET | Freq: Every day | ORAL | Status: DC

## 2013-02-17 NOTE — Telephone Encounter (Signed)
left message to return call

## 2013-02-17 NOTE — Telephone Encounter (Signed)
See original message.

## 2013-02-17 NOTE — Telephone Encounter (Signed)
Results discussed with patient. Patient verbalized understanding. Med sent [facility] [place].

## 2013-02-17 NOTE — Telephone Encounter (Signed)
TSH still very high. Double thyroid dose to 100 mcg daily. Repeat tsh in two months(Pt in [place] I think)

## 2013-02-17 NOTE — Addendum Note (Signed)
Addended by: Margaretha Sheffield on: 02/17/2013 10:45 AM   Modules accepted: Orders, Medications

## 2013-02-17 NOTE — Telephone Encounter (Signed)
Patient is calling to get BW results. °

## 2013-03-03 ENCOUNTER — Telehealth: Payer: Self-pay | Admitting: Family Medicine

## 2013-03-03 NOTE — Telephone Encounter (Signed)
Medication does not seem to be working for Thyroid.  Wants someone to call him  Please call Patient. Thanks

## 2013-03-03 NOTE — Telephone Encounter (Signed)
Patient states that he is still experiencing no appetite. No other symptoms. He has been taking levothyroxine 50 mcg daily. Wants to know does this med need to be changed or does he need to give it more time?

## 2013-03-03 NOTE — Telephone Encounter (Signed)
Notified patient appetite loss is not a symptom generally associated with low thyroid. Doctor is concerned with his complicated hx that he is not seeing a primary care doc for a whole 6 mos. He should see one down there. Also advised patient that TSH needs to be rechecked 30 days post med change. Patient stated that he would try to find a doctor as soon as possible in [place].

## 2013-03-03 NOTE — Telephone Encounter (Signed)
Appetite loss per se is not a symptom generally associated with low thyroid. I'm concerned with his complicated hx that he is not seeing a primary care doc for a whole 6 mos. He should see one down there. Meanwhile, go ahead and ck the tsh just 30 d post ;last change

## 2013-03-03 NOTE — Telephone Encounter (Signed)
See 7-23 and 7-28 phone message--did pt not increase to 100 mcg? Either way not seen in acouple mos I'd like to see this wk. incr to 100 if not done already

## 2013-03-03 NOTE — Telephone Encounter (Signed)
Patient states that he is taking 100 mcg currently (bottle was checked). He states that he can not come in for office visit because he is living in [place] and will not return to [place] until sometime in December.

## 2013-03-20 ENCOUNTER — Telehealth (HOSPITAL_COMMUNITY): Payer: Self-pay | Admitting: Oncology

## 2013-03-20 NOTE — Telephone Encounter (Signed)
He can take pneumovax.  Also recommend flu shot this fall as well

## 2013-03-27 ENCOUNTER — Other Ambulatory Visit: Payer: Self-pay | Admitting: Family Medicine

## 2013-04-19 ENCOUNTER — Other Ambulatory Visit: Payer: Self-pay | Admitting: Family Medicine

## 2013-05-19 ENCOUNTER — Other Ambulatory Visit (HOSPITAL_COMMUNITY): Payer: Self-pay | Admitting: Oncology

## 2013-05-19 DIAGNOSIS — K219 Gastro-esophageal reflux disease without esophagitis: Secondary | ICD-10-CM

## 2013-05-19 MED ORDER — OMEPRAZOLE 20 MG PO CPDR: 20.0000 mg | DELAYED_RELEASE_CAPSULE | Freq: Two times a day (BID) | ORAL | Status: DC

## 2013-06-19 ENCOUNTER — Other Ambulatory Visit: Payer: Self-pay | Admitting: Family Medicine

## 2013-07-10 NOTE — Progress Notes (Signed)
[name], MD [address] [place] [address]  Primary squamous cell carcinoma of tonsil - Plan: CBC with Differential, Comprehensive metabolic panel, TSH, CEA, CEA, CBC with Differential, Comprehensive metabolic panel, TSH  CURRENT THERAPY: Surveillance per NCCN guidelines  INTERVAL HISTORY: Kalen Ratajczak 72 y.o. 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 He is status post combined radiation and chemotherapy (02/05/2012- 03/18/2012) with complete remission. His PET scan on 06/18/2012 post therapy showed no evidence of persistence of disease.    Primary squamous cell carcinoma of tonsil   11/03/2011 Initial Diagnosis Primary squamous cell carcinoma of tonsil; invasice carcinoma with strong p16 immunohistochemistry staining.   02/05/2012 - 03/18/2012 Chemotherapy Concomitant chemotherapy (Cisplatin weekly) with radiation therapy    06/18/2012 Remission PET scan is negative for any metabolic disease.      NCCN guidelines for surveillance of Head and Neck cancer recommends:  A. H+P every 1-3 months for year 1  B. H+P every 2-6 months for year 2  C. H+P every 4-8 months for years 3-5  D. H+P every year for years greater than 5  E. Imaging only as clinically indicated following baseline imaging study within 6 months of completion of therapy.   F. TSH every 6-12 months.  G. Chest imaging as clinically indicated for patients with smoking history  H. Speech/hearing and swallowing evaluation and rehabilitation as clinically indicated.  I. Smoking cessation and EtOH counseling as clinically indicated.  J. Dental evaluation: recommended for oral cavity and sites exposed to significant intraoral radiation treatment.  K. Consider EBV DNA monitoring for nasopharyngeal cancer.  I personally reviewed and went  over laboratory results with the patient.  The results follow below.  These labs are out-of-date and therefore we will update today.  He requests that we mail him a copy of his labs so he may give them to his PCP in [place].  He reports that he is doing excellent.  He notes that his taste for foods has improved significantly, but have not yet returned to their baseline when compared to taste to foods prior to therapy.   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    He is on Levothyroxine replacement and this is managed by PCP.    Oncologically, he denies any complaints and ROS questioning is negative.     Past Medical History  Diagnosis Date  . High cholesterol   . Hearing deficit     Right greater than left.  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 Hypertension     EKG 5/13, chest CT 4/13 EPIC, clearance with OV 01/08/12 oncology T [facility] EPIC  . GERD (gastroesophageal reflux disease)   . Primary squamous cell carcinoma of tonsil 11/16/2011    last chemo 03/18/12; last XRT 03/22/12  . Radiation 02/05/12-03/22/12    7000 cGy 35 fx tonsil/neck  . Pneumonia   . ED (erectile dysfunction)     has Primary squamous cell carcinoma of tonsil; Hypertension; Hypercholesterolemia; GERD (gastroesophageal reflux disease); Dysphagia, pharyngeal; Gastrostomy tube dependent; Acute renal failure; Pharyngitis; Pneumococcal pneumonia; and Hypothyroidism (acquired) on his problem list.     has No Known Allergies.  Mr. Kissner had no medications administered during this visit.  Past Surgical History  Procedure Laterality Date  .  Portacath placement  11/24/2011    left side  . Multiple extractions with alveoloplasty  01/10/2012    Procedure: MULTIPLE EXTRACION WITH ALVEOLOPLASTY;  Surgeon: Charlynne Pander, DDS;  Location: [facility];  Service: Oral Surgery;  Laterality: N/A;  Extraction of tooth #'s  1,3,4,5,6,7,8,9,10,11,12,13,14,15,16,17,18,20,21,22,   . Esophagogastroduodenoscopy  01/16/2012    SLF: Mild gastritis  . Peg placement  01/16/2012    Procedure: PERCUTANEOUS ENDOSCOPIC GASTROSTOMY (PEG) PLACEMENT;  Surgeon: West Bali, MD;  Location: [facility];  Service: Endoscopy;  Laterality: N/A;  Ancef one gram IV before procedure  . Port-a-cath removal  07/08/2012    Procedure: REMOVAL PORT-A-CATH;  Surgeon: Marlane Hatcher, MD;  Location: [facility];  Service: General;  Laterality: N/A;  Removal of Port-A-Cath Left Subclavian    Denies any headaches, dizziness, double vision, fevers, chills, night sweats, nausea, vomiting, diarrhea, constipation, chest pain, heart palpitations, shortness of breath, blood in stool, black tarry stool, urinary pain, urinary burning, urinary frequency, hematuria.   PHYSICAL EXAMINATION  ECOG PERFORMANCE STATUS: 0 - Asymptomatic  Filed Vitals:   07/11/13 0937  BP: 127/86  Pulse: 78  Temp: 99 F (37.2 C)  Resp: 16    GENERAL:alert, no distress, well nourished, well developed, comfortable, cooperative and smiling SKIN: skin color, texture, turgor are normal, no rashes or significant lesions HEAD: Normocephalic, No masses, lesions, tenderness or abnormalities EYES: normal, PERRLA, EOMI, Conjunctiva are pink and non-injected EARS: External ears normal OROPHARYNX:no exudate, no erythema, lips, buccal mucosa, and tongue normal and mucous membranes are moist  NECK: supple, no adenopathy, thyroid normal size, non-tender, without nodularity, no stridor, non-tender, trachea midline LYMPH:  no palpable lymphadenopathy BREAST:not examined LUNGS: clear to auscultation and percussion HEART: regular rate & rhythm, no murmurs, no gallops, S1 normal and S2 normal ABDOMEN:abdomen soft, non-tender, normal bowel sounds, no masses or organomegaly and no hepatosplenomegaly BACK: Back symmetric, no curvature., No CVA tenderness EXTREMITIES:less then 2 second  capillary refill, no joint deformities, effusion, or inflammation, no edema, no skin discoloration, no clubbing, no cyanosis  NEURO: alert & oriented x 3 with fluent speech, no focal motor/sensory deficits, gait normal    LABORATORY DATA: CBC    Component Value Date/Time   WBC 11.8* 12/25/2012 1059   RBC 3.38* 12/25/2012 1059   HGB 10.9* 12/25/2012 1059   HCT 32.4* 12/25/2012 1059   PLT 343 12/25/2012 1059   MCV 95.9 12/25/2012 1059   MCH 32.2 12/25/2012 1059   MCHC 33.6 12/25/2012 1059   RDW 13.9 12/25/2012 1059   LYMPHSABS 1.1 12/25/2012 1059   MONOABS 0.8 12/25/2012 1059   EOSABS 0.1 12/25/2012 1059   BASOSABS 0.0 12/25/2012 1059      Chemistry      Component Value Date/Time   NA 141 11/27/2012 0855   K 4.0 11/27/2012 0855   CL 100 11/27/2012 0855   CO2 30 11/27/2012 0855   BUN 22 11/27/2012 0855   CREATININE 1.28 11/27/2012 0855   CREATININE 1.32 07/05/2012 0930      Component Value Date/Time   CALCIUM 9.6 11/27/2012 0855   ALKPHOS 48 11/27/2012 0855   AST 12 11/27/2012 0855   ALT 8 11/27/2012 0855   BILITOT 0.5 11/27/2012 0855     Lab Results  Component Value Date   TSH 105.682* 12/25/2012      ASSESSMENT:  1. Stage IV squamous cell carcinoma of the tonsil on the right, HPV positive, presenting with a large right oral pharyngeal mass and bilateral neck lymph  node positivity. He is status post combined radiation and chemotherapy (02/05/2012- 03/18/2012) with complete remission. His PET scan on 06/18/2012 post therapy showed no evidence of persistence of disease. 2. Hypothyroidism, on replacement 3. Taste changes 4. Nearly resolved neuropathy  Patient Active Problem List   Diagnosis Date Noted  . Hypothyroidism (acquired) 01/08/2013  . Pneumococcal pneumonia 03/31/2012  . GERD (gastroesophageal reflux disease) 03/29/2012  . Dysphagia, pharyngeal 03/29/2012  . Gastrostomy tube dependent 03/29/2012  . Acute renal failure 03/29/2012  . Pharyngitis 03/29/2012  . Primary squamous cell carcinoma of tonsil  11/16/2011  . Hypertension 11/16/2011  . Hypercholesterolemia 11/16/2011     PLAN:  1. I personally reviewed and went over laboratory results with the patient. 2. Labs today: CBC diff, CMET, TSH, CEA 3. Reviewed the NCCN guidelines pertaining to surveillance 4. Return in 6 months for follow-up per the patient's request as he is going back to [place].  He requests we mail him a copy of his labs to [place].     THERAPY PLAN:  NCCN guidelines for surveillance of Head and Neck cancer recommends:  A. H+P every 1-3 months for year 1  B. H+P every 2-6 months for year 2  C. H+P every 4-8 months for years 3-5  D. H+P every year for years greater than 5  E. Imaging only as clinically indicated following baseline imaging study within 6 months of completion of therapy.   F. TSH every 6-12 months.  G. Chest imaging as clinically indicated for patients with smoking history  H. Speech/hearing and swallowing evaluation and rehabilitation as clinically indicated.  I. Smoking cessation and EtOH counseling as clinically indicated.  J. Dental evaluation: recommended for oral cavity and sites exposed to significant intraoral radiation treatment.  K. Consider EBV DNA monitoring for nasopharyngeal cancer.   All questions were answered. The patient knows to call the clinic with any problems, questions or concerns. We can certainly see the patient much sooner if necessary.  Patient and plan discussed with Dr. Alla German and he is in agreement with the aforementioned.   Lasharn Bufkin

## 2013-07-11 ENCOUNTER — Encounter (HOSPITAL_COMMUNITY): Payer: Federal, State, Local not specified - PPO | Attending: Oncology | Admitting: Oncology

## 2013-07-11 ENCOUNTER — Encounter (HOSPITAL_COMMUNITY): Payer: Self-pay | Admitting: Oncology

## 2013-07-11 VITALS — BP 127/86 | HR 78 | Temp 99.0°F | Resp 16 | Wt 145.7 lb

## 2013-07-11 DIAGNOSIS — Z85819 Personal history of malignant neoplasm of unspecified site of lip, oral cavity, and pharynx: Secondary | ICD-10-CM | POA: Insufficient documentation

## 2013-07-11 DIAGNOSIS — C099 Malignant neoplasm of tonsil, unspecified: Secondary | ICD-10-CM

## 2013-07-11 DIAGNOSIS — Z09 Encounter for follow-up examination after completed treatment for conditions other than malignant neoplasm: Secondary | ICD-10-CM | POA: Insufficient documentation

## 2013-07-11 DIAGNOSIS — B977 Papillomavirus as the cause of diseases classified elsewhere: Secondary | ICD-10-CM

## 2013-07-11 DIAGNOSIS — G589 Mononeuropathy, unspecified: Secondary | ICD-10-CM

## 2013-07-11 LAB — COMPREHENSIVE METABOLIC PANEL
ALT: 11 U/L (ref 0–53)
AST: 13 U/L (ref 0–37)
Albumin: 4.2 g/dL (ref 3.5–5.2)
Calcium: 10.4 mg/dL (ref 8.4–10.5)
Chloride: 97 mEq/L (ref 96–112)
Creatinine, Ser: 1.38 mg/dL — ABNORMAL HIGH (ref 0.50–1.35)
Sodium: 139 mEq/L (ref 135–145)
Total Bilirubin: 0.3 mg/dL (ref 0.3–1.2)

## 2013-07-11 LAB — CBC WITH DIFFERENTIAL/PLATELET
Basophils Absolute: 0 10*3/uL (ref 0.0–0.1)
Basophils Relative: 0 % (ref 0–1)
Eosinophils Absolute: 0.2 10*3/uL (ref 0.0–0.7)
HCT: 34.7 % — ABNORMAL LOW (ref 39.0–52.0)
MCH: 31.9 pg (ref 26.0–34.0)
MCHC: 34 g/dL (ref 30.0–36.0)
Monocytes Absolute: 0.7 10*3/uL (ref 0.1–1.0)
Neutro Abs: 5.3 10*3/uL (ref 1.7–7.7)
Neutrophils Relative %: 75 % (ref 43–77)
Platelets: 217 10*3/uL (ref 150–400)
RDW: 13.1 % (ref 11.5–15.5)
WBC: 7.1 10*3/uL (ref 4.0–10.5)

## 2013-07-11 NOTE — Progress Notes (Signed)
Austin Mathis presented for labwork. Labs per MD order drawn via Peripheral Line 23 gauge needle inserted in left antecubital.  Good blood return present. Procedure without incident.  Needle removed intact. Patient tolerated procedure well.

## 2013-07-11 NOTE — Patient Instructions (Signed)
[facility] Discharge Instructions  RECOMMENDATIONS MADE BY THE CONSULTANT AND ANY TEST RESULTS WILL BE SENT TO YOUR REFERRING PHYSICIAN.  Lab work today. We will call you if there are any abnormal results. We will mail you a copy of your lab as requested and you have access to this information through My Chart. Return to clinic in 6 months for follow up. Call with any issues/concerns as needed prior to appointment.  Thank you for choosing [facility] to provide your oncology and hematology care.  To afford each patient quality time with our providers, please arrive at least 15 minutes before your scheduled appointment time.  With your help, our goal is to use those 15 minutes to complete the necessary work-up to ensure our physicians have the information they need to help with your evaluation and healthcare recommendations.    Effective January 1st, 2014, we ask that you re-schedule your appointment with our physicians should you arrive 10 or more minutes late for your appointment.  We strive to give you quality time with our providers, and arriving late affects you and other patients whose appointments are after yours.    Again, thank you for choosing [facility].  Our hope is that these requests will decrease the amount of time that you wait before being seen by our physicians.       _____________________________________________________________  Should you have questions after your visit to [facility], please contact our office at [phone] between the hours of 8:30 a.m. and 5:00 p.m.  Voicemails left after 4:30 p.m. will not be returned until the following business day.  For prescription refill requests, have your pharmacy contact our office with your prescription refill request.

## 2013-07-12 LAB — CEA: CEA: 1.4 ng/mL (ref 0.0–5.0)

## 2013-07-14 ENCOUNTER — Ambulatory Visit (HOSPITAL_COMMUNITY): Payer: Federal, State, Local not specified - PPO | Admitting: Oncology

## 2013-09-22 ENCOUNTER — Other Ambulatory Visit (HOSPITAL_COMMUNITY): Payer: Self-pay | Admitting: Oncology

## 2013-09-22 DIAGNOSIS — K219 Gastro-esophageal reflux disease without esophagitis: Secondary | ICD-10-CM

## 2013-09-22 MED ORDER — OMEPRAZOLE 20 MG PO CPDR: 20.0000 mg | DELAYED_RELEASE_CAPSULE | Freq: Two times a day (BID) | ORAL | Status: AC

## 2013-10-22 HISTORY — PX: CATARACT EXTRACTION, BILATERAL: SHX1313

## 2013-10-27 ENCOUNTER — Other Ambulatory Visit: Payer: Self-pay | Admitting: Nurse Practitioner

## 2014-01-05 ENCOUNTER — Encounter (HOSPITAL_COMMUNITY): Payer: Federal, State, Local not specified - PPO | Attending: Hematology and Oncology

## 2014-01-05 ENCOUNTER — Encounter (HOSPITAL_BASED_OUTPATIENT_CLINIC_OR_DEPARTMENT_OTHER): Payer: Federal, State, Local not specified - PPO

## 2014-01-05 ENCOUNTER — Encounter (HOSPITAL_COMMUNITY): Payer: Self-pay

## 2014-01-05 VITALS — BP 137/74 | HR 71 | Temp 97.9°F | Resp 16 | Wt 155.6 lb

## 2014-01-05 DIAGNOSIS — B977 Papillomavirus as the cause of diseases classified elsewhere: Secondary | ICD-10-CM

## 2014-01-05 DIAGNOSIS — Z9221 Personal history of antineoplastic chemotherapy: Secondary | ICD-10-CM | POA: Insufficient documentation

## 2014-01-05 DIAGNOSIS — Z9849 Cataract extraction status, unspecified eye: Secondary | ICD-10-CM | POA: Insufficient documentation

## 2014-01-05 DIAGNOSIS — H9190 Unspecified hearing loss, unspecified ear: Secondary | ICD-10-CM | POA: Insufficient documentation

## 2014-01-05 DIAGNOSIS — Z931 Gastrostomy status: Secondary | ICD-10-CM | POA: Insufficient documentation

## 2014-01-05 DIAGNOSIS — Z85819 Personal history of malignant neoplasm of unspecified site of lip, oral cavity, and pharynx: Secondary | ICD-10-CM

## 2014-01-05 DIAGNOSIS — G589 Mononeuropathy, unspecified: Secondary | ICD-10-CM | POA: Insufficient documentation

## 2014-01-05 DIAGNOSIS — C099 Malignant neoplasm of tonsil, unspecified: Secondary | ICD-10-CM

## 2014-01-05 DIAGNOSIS — E785 Hyperlipidemia, unspecified: Secondary | ICD-10-CM | POA: Insufficient documentation

## 2014-01-05 DIAGNOSIS — K219 Gastro-esophageal reflux disease without esophagitis: Secondary | ICD-10-CM | POA: Insufficient documentation

## 2014-01-05 DIAGNOSIS — Z923 Personal history of irradiation: Secondary | ICD-10-CM | POA: Insufficient documentation

## 2014-01-05 DIAGNOSIS — Z79899 Other long term (current) drug therapy: Secondary | ICD-10-CM | POA: Insufficient documentation

## 2014-01-05 DIAGNOSIS — E039 Hypothyroidism, unspecified: Secondary | ICD-10-CM | POA: Insufficient documentation

## 2014-01-05 DIAGNOSIS — I10 Essential (primary) hypertension: Secondary | ICD-10-CM | POA: Insufficient documentation

## 2014-01-05 LAB — COMPREHENSIVE METABOLIC PANEL
ALBUMIN: 3.9 g/dL (ref 3.5–5.2)
ALT: 10 U/L (ref 0–53)
AST: 13 U/L (ref 0–37)
Alkaline Phosphatase: 52 U/L (ref 39–117)
BILIRUBIN TOTAL: 0.4 mg/dL (ref 0.3–1.2)
BUN: 21 mg/dL (ref 6–23)
CHLORIDE: 99 meq/L (ref 96–112)
CO2: 31 mEq/L (ref 19–32)
CREATININE: 1.32 mg/dL (ref 0.50–1.35)
Calcium: 9.5 mg/dL (ref 8.4–10.5)
GFR calc Af Amer: 59 mL/min — ABNORMAL LOW (ref >90)
GFR calc non Af Amer: 51 mL/min — ABNORMAL LOW (ref >90)
Glucose, Bld: 104 mg/dL — ABNORMAL HIGH (ref 70–99)
Potassium: 4.2 mEq/L (ref 3.7–5.3)
Sodium: 142 mEq/L (ref 137–147)
Total Protein: 7.5 g/dL (ref 6.0–8.3)

## 2014-01-05 LAB — CBC WITH DIFFERENTIAL/PLATELET
BASOS PCT: 0 % (ref 0–1)
Basophils Absolute: 0 10*3/uL (ref 0.0–0.1)
EOS ABS: 0.2 10*3/uL (ref 0.0–0.7)
EOS PCT: 2 % (ref 0–5)
HCT: 37.3 % — ABNORMAL LOW (ref 39.0–52.0)
Hemoglobin: 12.7 g/dL — ABNORMAL LOW (ref 13.0–17.0)
LYMPHS ABS: 0.8 10*3/uL (ref 0.7–4.0)
Lymphocytes Relative: 10 % — ABNORMAL LOW (ref 12–46)
MCH: 32 pg (ref 26.0–34.0)
MCHC: 34 g/dL (ref 30.0–36.0)
MCV: 94 fL (ref 78.0–100.0)
MONOS PCT: 10 % (ref 3–12)
Monocytes Absolute: 0.9 10*3/uL (ref 0.1–1.0)
Neutro Abs: 6.7 10*3/uL (ref 1.7–7.7)
Neutrophils Relative %: 78 % — ABNORMAL HIGH (ref 43–77)
Platelets: 190 10*3/uL (ref 150–400)
RBC: 3.97 MIL/uL — AB (ref 4.22–5.81)
RDW: 13.7 % (ref 11.5–15.5)
WBC: 8.6 10*3/uL (ref 4.0–10.5)

## 2014-01-05 LAB — LACTATE DEHYDROGENASE: LDH: 166 U/L (ref 94–250)

## 2014-01-05 NOTE — Progress Notes (Signed)
Austin Mathis presented for labwork. Labs per MD order drawn via Peripheral Line 23 gauge needle inserted in Left AC  Good blood return present. Procedure without incident.  Needle removed intact. Patient tolerated procedure well.

## 2014-01-05 NOTE — Addendum Note (Signed)
Addended by: Mellissa Kohut on: 01/05/2014 09:42 AM   Modules accepted: Orders

## 2014-01-05 NOTE — Patient Instructions (Signed)
[facility] Discharge Instructions  RECOMMENDATIONS MADE BY THE CONSULTANT AND ANY TEST RESULTS WILL BE SENT TO YOUR REFERRING PHYSICIAN.  EXAM FINDINGS BY THE PHYSICIAN TODAY AND SIGNS OR SYMPTOMS TO REPORT TO CLINIC OR PRIMARY PHYSICIAN: Exam and findings as discussed by Dr. Barnet Glasgow.  You are doing well.  Report and difficulty swallowing, unexplained weight loss or other problems.    INSTRUCTIONS/FOLLOW-UP: Follow-up in 6 months with labs and office visit.  Thank you for choosing [facility] to provide your oncology and hematology care.  To afford each patient quality time with our providers, please arrive at least 15 minutes before your scheduled appointment time.  With your help, our goal is to use those 15 minutes to complete the necessary work-up to ensure our physicians have the information they need to help with your evaluation and healthcare recommendations.    Effective January 1st, 2014, we ask that you re-schedule your appointment with our physicians should you arrive 10 or more minutes late for your appointment.  We strive to give you quality time with our providers, and arriving late affects you and other patients whose appointments are after yours.    Again, thank you for choosing [facility].  Our hope is that these requests will decrease the amount of time that you wait before being seen by our physicians.       _____________________________________________________________  Should you have questions after your visit to [facility], please contact our office at [phone] between the hours of 8:30 a.m. and 5:00 p.m.  Voicemails left after 4:30 p.m. will not be returned until the following business day.  For prescription refill requests, have your pharmacy contact our office with your prescription refill request.

## 2014-01-05 NOTE — Progress Notes (Signed)
[facility]  OFFICE PROGRESS NOTE  LUKING,SCOTT, MD [place] [address]  DIAGNOSIS: Primary squamous cell carcinoma of tonsil - Plan: Comprehensive metabolic panel, Lactate dehydrogenase, CBC with Differential, Comprehensive metabolic panel, Lactate dehydrogenase, CBC with Differential  GERD (gastroesophageal reflux disease)  Chief Complaint  Patient presents with  . Squamous cell carcinoma of the tonsil stage IV    CURRENT THERAPY: Watchful expectation.  INTERVAL HISTORY: Austin Mathis 73 y.o. 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 He feels well with no dry mouth or dysphagia. Appetite has been good with no nausea, vomiting, diarrhea, constipation, with only minimal residual paresthesias involving his feet. He denies any constipation, diarrhea, melena, hematochezia, hematuria, epistaxis, or oral bleeding. He also denies any fever, night sweats, cough, wheezing, or seasonal allergies. He plans to visit his family in [place] later this summer.   MEDICAL HISTORY: Past Medical History  Diagnosis Date  . High cholesterol   . Hearing deficit     Right greater than left.  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 Hypertension     EKG 5/13, chest CT 4/13 EPIC, clearance with OV 01/08/12 oncology T [facility]ef[facility] EPIC  . GERD (gastroesophageal reflux disease)   . Primary squamous cell carcinoma of tonsil 11/16/2011    last chemo 03/18/12; last XRT 03/22/12  . Radiation 02/05/12-03/22/12    7000 cGy 35 fx tonsil/neck  . Pneumonia   . ED (erectile dysfunction)     INTERIM HISTORY: has Primary  squamous cell carcinoma of tonsil; Hypertension; Hypercholesterolemia; GERD (gastroesophageal reflux disease); Dysphagia, pharyngeal; Gastrostomy tube dependent; Acute renal failure; Pharyngitis; Pneumococcal pneumonia; and Hypothyroidism (acquired) on his problem list.   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 He is status post combined radiation and chemotherapy (02/05/2012- 03/18/2012) with complete remission. His PET scan on 06/18/2012 post therapy showed no evidence of persistence of disease   Primary squamous cell carcinoma of tonsil    11/03/2011  Initial Diagnosis  Primary squamous cell carcinoma of tonsil; invasice carcinoma with strong p16 immunohistochemistry staining.    02/05/2012 - 03/18/2012  Chemotherapy  Concomitant chemotherapy (Cisplatin weekly) with radiation therapy    06/18/2012  Remission  PET scan is negative for any metabolic disease.     ALLERGIES:  has No Known Allergies.  MEDICATIONS: has a current medication list which includes the following prescription(s): levothyroxine, omeprazole, pravastatin, and verapamil.  SURGICAL HISTORY:  Past Surgical History  Procedure Laterality Date  . Portacath placement  11/24/2011    left side  . Multiple extractions with alveoloplasty  01/10/2012    Procedure: MULTIPLE EXTRACION WITH ALVEOLOPLASTY;  Surgeon: Lenn Cal, DDS;  Location: [facility];  Service: Oral Surgery;  Laterality: N/A;  Extraction of tooth #'s 1,3,4,5,6,7,8,9,10,11,12,13,14,15,16,17,18,20,21,22,   . Esophagogastroduodenoscopy  01/16/2012    SLF: Mild gastritis  . Peg placement  01/16/2012    Procedure: PERCUTANEOUS ENDOSCOPIC GASTROSTOMY (PEG) PLACEMENT;  Surgeon: Danie Binder, MD;  Location: [facility];  Service: Endoscopy;  Laterality: N/A;  Ancef one gram  IV before procedure  . Port-a-cath removal  07/08/2012    Procedure: REMOVAL  PORT-A-CATH;  Surgeon: Scherry Ran, MD;  Location: [facility];  Service: General;  Laterality: N/A;  Removal of Port-A-Cath Left Subclavian  . Cataract extraction, bilateral Bilateral 10/2013    FAMILY HISTORY: family history includes Hypertension in his mother.  SOCIAL HISTORY:  reports that he has never smoked. He has never used smokeless tobacco. He reports that he does not drink alcohol or use illicit drugs.  REVIEW OF SYSTEMS:  Other than that discussed above is noncontributory.  PHYSICAL EXAMINATION: ECOG PERFORMANCE STATUS: 1 - Symptomatic but completely ambulatory  Blood pressure 137/74, pulse 71, temperature 97.9 F (36.6 C), temperature source Oral, resp. rate 16, weight 155 lb 9.6 oz (70.58 kg).  GENERAL:alert, no distress and comfortable SKIN: skin color, texture, turgor are normal, no rashes or significant lesions EYES: PERLA; Conjunctiva are pink and non-injected, sclera clear. Hearing aid in place in the right ear. SINUSES: No redness or tenderness over maxillary or ethmoid sinuses OROPHARYNX:no exudate, no erythema on lips, buccal mucosa, or tongue. Mucosa appears moist. NECK: ligneous without mass. CHEST: Normal AP diameter with no gynecomastia. LYMPH:  no palpable lymphadenopathy in the cervical, axillary or inguinal LUNGS: clear to auscultation and percussion with normal breathing effort HEART: regular rate & rhythm and no murmurs. ABDOMEN:abdomen soft, non-tender and normal bowel sounds MUSCULOSKELETAL:no cyanosis of digits and no clubbing. Range of motion normal.  NEURO: alert & oriented x 3 with fluent speech, no focal motor/sensory deficits   LABORATORY DATA: Office Visit on 01/05/2014  Component Date Value Ref Range Status  . WBC 01/05/2014 8.6  4.0 - 10.5 K/uL Final  . RBC 01/05/2014 3.97* 4.22 - 5.81 MIL/uL Final  . Hemoglobin 01/05/2014 12.7* 13.0 - 17.0 g/dL Final  . HCT 01/05/2014 37.3* 39.0 - 52.0 % Final  . MCV 01/05/2014 94.0  78.0 - 100.0 fL  Final  . MCH 01/05/2014 32.0  26.0 - 34.0 pg Final  . MCHC 01/05/2014 34.0  30.0 - 36.0 g/dL Final  . RDW 01/05/2014 13.7  11.5 - 15.5 % Final  . Platelets 01/05/2014 190  150 - 400 K/uL Final  . Neutrophils Relative % 01/05/2014 78* 43 - 77 % Final  . Neutro Abs 01/05/2014 6.7  1.7 - 7.7 K/uL Final  . Lymphocytes Relative 01/05/2014 10* 12 - 46 % Final  . Lymphs Abs 01/05/2014 0.8  0.7 - 4.0 K/uL Final  . Monocytes Relative 01/05/2014 10  3 - 12 % Final  . Monocytes Absolute 01/05/2014 0.9  0.1 - 1.0 K/uL Final  . Eosinophils Relative 01/05/2014 2  0 - 5 % Final  . Eosinophils Absolute 01/05/2014 0.2  0.0 - 0.7 K/uL Final  . Basophils Relative 01/05/2014 0  0 - 1 % Final  . Basophils Absolute 01/05/2014 0.0  0.0 - 0.1 K/uL Final    PATHOLOGY: Squamous cell carcinoma, HPV positive.  Urinalysis    Component Value Date/Time   COLORURINE YELLOW 03/29/2012 0140   APPEARANCEUR CLEAR 03/29/2012 0140   LABSPEC 1.015 03/29/2012 0140   PHURINE 5.5 03/29/2012 0140   GLUCOSEU NEGATIVE 03/29/2012 0140   HGBUR NEGATIVE 03/29/2012 0140   BILIRUBINUR NEGATIVE 03/29/2012 0140   KETONESUR NEGATIVE 03/29/2012 0140   PROTEINUR NEGATIVE 03/29/2012 0140   UROBILINOGEN 0.2 03/29/2012 0140   NITRITE NEGATIVE 03/29/2012 0140   LEUKOCYTESUR NEGATIVE 03/29/2012 0140    RADIOGRAPHIC STUDIES: No results found.  ASSESSMENT:  1. Stage IV squamous cell carcinoma  of the tonsil on the right, HPV positive, presenting with a large right oral pharyngeal mass and bilateral neck lymph node positivity. He is status post combined radiation and chemotherapy (02/05/2012- 03/18/2012) with complete remission. His PET scan on 06/18/2012 post therapy showed no evidence of persistence of disease.  2. Hypothyroidism, on replacement  3. Taste changes  4. Nearly resolved neuropathy. 5. Hearing deficit wearing a hearing aid in his right ear.    PLAN:  1. Adjustment in Synthroid dosage if TSH is elevated. 2. Followup in 6 months with CBC, chem  profile, LDH, and TSH.   All questions were answered. The patient knows to call the clinic with any problems, questions or concerns. We can certainly see the patient much sooner if necessary.   I spent 25 minutes counseling the patient face to face. The total time spent in the appointment was 30 minutes.    Doroteo Bradford, MD 01/05/2014 9:19 AM  DISCLAIMER:  This note was dictated with voice recognition software.  Similar sounding words can inadvertently be transcribed inaccurately and may not be corrected upon review.

## 2014-01-16 ENCOUNTER — Ambulatory Visit (HOSPITAL_COMMUNITY): Payer: Self-pay

## 2014-07-02 ENCOUNTER — Other Ambulatory Visit (HOSPITAL_COMMUNITY): Payer: Self-pay

## 2014-07-02 DIAGNOSIS — C099 Malignant neoplasm of tonsil, unspecified: Secondary | ICD-10-CM

## 2014-07-04 NOTE — Progress Notes (Signed)
Austin Lange, MD [place] [address]  Primary squamous cell carcinoma of tonsil - Plan: CBC with Differential, Comprehensive metabolic panel, Lactate dehydrogenase, TSH  CURRENT THERAPY: Surveillance per NCCN guidelines  INTERVAL HISTORY: Austin Mathis 73 y.o. 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    Primary squamous cell carcinoma of tonsil   11/03/2011 Initial Diagnosis Primary squamous cell carcinoma of tonsil; invasice carcinoma with strong p16 immunohistochemistry staining.   02/05/2012 - 03/18/2012 Chemotherapy Concomitant chemotherapy (Cisplatin weekly) with radiation therapy    06/18/2012 Remission PET scan is negative for any metabolic disease.   I personally reviewed and went over laboratory results with the patient.  The results are noted within this dictation.   Austin Mathis is doing well.He has no complaints. He has no difficulty swallowing, his weight is stable, and his energy is good. In addition he denies any new cough fever chills or other problems. We discussed screening colonoscopy and he is not interested in pursuing that. He states his blood pressures at home typically run between 272 and 536 systolic. He checks them daily.  Past Medical History  Diagnosis Date  . High cholesterol   . Hearing deficit     Right greater than left.  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 Hypertension     EKG 5/13, chest CT 4/13 EPIC, clearance with OV 01/08/12 oncology T Keflas EPIC  . GERD (gastroesophageal reflux disease)   . Primary squamous cell carcinoma of tonsil 11/16/2011    last chemo 03/18/12; last XRT  03/22/12  . Radiation 02/05/12-03/22/12    7000 cGy 35 fx tonsil/neck  . Pneumonia   . ED (erectile dysfunction)     has Primary squamous cell carcinoma of tonsil; Hypertension; Hypercholesterolemia; GERD (gastroesophageal reflux disease); Dysphagia, pharyngeal; Gastrostomy tube dependent; Acute renal failure; Pharyngitis; Pneumococcal pneumonia; and Hypothyroidism (acquired) on his problem list.     has No Known Allergies.  Austin Mathis does not currently have medications on file.  Past Surgical History  Procedure Laterality Date  . Portacath placement  11/24/2011    left side  . Multiple extractions with alveoloplasty  01/10/2012    Procedure: MULTIPLE EXTRACION WITH ALVEOLOPLASTY;  Surgeon: Lenn Cal, DDS;  Location: [facility];  Service: Oral Surgery;  Laterality: N/A;  Extraction of tooth #'s 1,3,4,5,6,7,8,9,10,11,12,13,14,15,16,17,18,20,21,22,   . Esophagogastroduodenoscopy  01/16/2012    SLF: Mild gastritis  . Peg placement  01/16/2012    Procedure: PERCUTANEOUS ENDOSCOPIC GASTROSTOMY (PEG) PLACEMENT;  Surgeon: Danie Binder, MD;  Location[facility];  Service: Endoscopy;  Laterality: N/A;  Ancef one gram IV before procedure  . Port-a-cath removal  07/08/2012    Procedure: REMOVAL PORT-A-CATH;  Surgeon: Scherry Ran, MD;  Location: [facility];  Service: General;  Laterality: N/A;  Removal of Port-A-Cath Left Subclavian  . Cataract extraction, bilateral Bilateral 10/2013  . Cataract  Bilateral      denies any headaches, dizziness, double vision, fevers, chills, night sweats, nausea, vomiting, diarrhea, constipation, chest pain, heart palpitations, shortness of breath, blood in stool, black tarry stool, urinary pain, urinary burning, urinary frequency, hematuria.   PHYSICAL EXAMINATION  ECOG PERFORMANCE STATUS: 0 -  Asymptomatic  Filed Vitals:   07/06/14 0916  BP: 140/87  Pulse: 78  Temp: 98.8 F (37.1 C)  Resp: 18    GENERAL:alert, healthy, no distress and well  nourished SKIN: skin color, texture, turgor are normal, no rashes or significant lesions HEAD: Normocephalic, No masses, lesions, tenderness or abnormalities. Neck thickening in the submandibular region, secondary to XRT. EYES: normal, PERRLA, EOMI, Conjunctiva are pink and non-injected, sclera clear EARS: External ears normal OROPHARYNX:no exudate and no erythema Upper dentures NECK: supple, no adenopathy, thyroid normal size, non-tender, without nodularity, no stridor, non-tender, trachea midline LYMPH:  no palpable lymphadenopathy, no hepatosplenomegaly BREAST:not examined LUNGS: clear to auscultation and percussion HEART: regular rate & rhythm ABDOMEN:abdomen soft, non-tender, normal bowel sounds and no masses or organomegaly BACK: Back symmetric, no curvature. EXTREMITIES:no joint deformities, effusion, or inflammation, no edema  NEURO: alert & oriented x 3 with fluent speech, gait normal   LABORATORY DATA: CBC    Component Value Date/Time   WBC 5.9 07/06/2014 0918   RBC 4.32 07/06/2014 0918   HGB 13.5 07/06/2014 0918   HCT 40.1 07/06/2014 0918   PLT 232 07/06/2014 0918   MCV 92.8 07/06/2014 0918   MCH 31.3 07/06/2014 0918   MCHC 33.7 07/06/2014 0918   RDW 13.3 07/06/2014 0918   LYMPHSABS 1.2 07/06/2014 0918   MONOABS 0.5 07/06/2014 0918   EOSABS 0.2 07/06/2014 0918   BASOSABS 0.0 07/06/2014 0918      Chemistry      Component Value Date/Time   NA 141 07/06/2014 0918   K 3.7 07/06/2014 0918   CL 100 07/06/2014 0918   CO2 29 07/06/2014 0918   BUN 13 07/06/2014 0918   CREATININE 1.34 07/06/2014 0918   CREATININE 1.28 11/27/2012 0855      Component Value Date/Time   CALCIUM 9.5 07/06/2014 0918   ALKPHOS 60 07/06/2014 0918   AST 15 07/06/2014 0918   ALT 9 07/06/2014 0918   BILITOT 0.6 07/06/2014 0918     Lab Results  Component Value Date   TSH 6.543* 07/11/2013     ASSESSMENT:  1. Stage IV squamous cell carcinoma of the tonsil on the right, HPV positive,  presenting with a large right oral pharyngeal mass and bilateral neck lymph node positivity. He is status post combined radiation and chemotherapy (02/05/2012- 03/18/2012) with complete remission. His PET scan on 06/18/2012 post therapy showed no evidence of persistence of disease.  2. Hypothyroidism, on replacement  3. Taste changes  4. Nearly resolved neuropathy. 5. Hearing deficit wearing a hearing aid in his right ear.  Patient Active Problem List   Diagnosis Date Noted  . Hypothyroidism (acquired) 01/08/2013  . Pneumococcal pneumonia 03/31/2012  . GERD (gastroesophageal reflux disease) 03/29/2012  . Dysphagia, pharyngeal 03/29/2012  . Gastrostomy tube dependent 03/29/2012  . Acute renal failure 03/29/2012  . Pharyngitis 03/29/2012  . 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 Hypertension 11/16/2011  . Hypercholesterolemia 11/16/2011    PLAN:  1. I personally reviewed and went over laboratory results with the patient.  The results are noted within this dictation. 2. I personally reviewed and went over radiographic studies with the patient.  The results are noted within this dictation.   3. Chart reviewed 4. Labs today: CBC diff, CMET, LDH, TSH 5. Labs in 6 months: CBC diff, CMET, LDH, TSH 6. Return in 6 months for follow-up 7. Patient was advised to follow-up with his primary physician in [place] and establish with a local ENT for  additional follow-up as soon as possible. 8. TSH is elevated and he will need adjustment of his Synthroid we will fax his TSH results to his primary care physician Dr. Veronda Prude in [place].   THERAPY PLAN:  NCCN guidelines for surveillance of Head and Neck cancer recommends:  A. H+P every 1-3 months for year 1  B. H+P every 2-6 months for year 2  C. H+P every 4-8 months for years 3-5  D. H+P every year for years greater than 5  E. Imaging only as clinically indicated following baseline imaging study within 6 months of completion of  therapy.   F.Complete head and neck exam; mirror and fiberoptic examination as clinically indicated.  G. TSH every 6-12 months.  H. Chest imaging as clinically indicated for patients with smoking history  I. Speech/hearing and swallowing evaluation and rehabilitation as clinically indicated.  J. Smoking cessation and EtOH counseling as clinically indicated.  K. Dental evaluation: recommended for oral cavity and sites exposed to significant intraoral radiation treatment.  L. Consider EBV DNA monitoring for nasopharyngeal cancer.    All questions were answered. The patient knows to call the clinic with any problems, questions or concerns. We can certainly see the patient much sooner if necessary. Patient and plan discussed with Dr. Farrel Gobble and he is in agreement with the aforementioned.   Austin Mathis 07/06/2014

## 2014-07-06 ENCOUNTER — Encounter (HOSPITAL_COMMUNITY): Payer: Federal, State, Local not specified - PPO | Attending: Oncology | Admitting: Oncology

## 2014-07-06 ENCOUNTER — Encounter (HOSPITAL_BASED_OUTPATIENT_CLINIC_OR_DEPARTMENT_OTHER): Payer: Federal, State, Local not specified - PPO

## 2014-07-06 ENCOUNTER — Encounter (HOSPITAL_COMMUNITY): Payer: Self-pay | Admitting: Oncology

## 2014-07-06 VITALS — BP 140/87 | HR 78 | Temp 98.8°F | Resp 18 | Wt 158.7 lb

## 2014-07-06 DIAGNOSIS — Z923 Personal history of irradiation: Secondary | ICD-10-CM | POA: Diagnosis not present

## 2014-07-06 DIAGNOSIS — H9191 Unspecified hearing loss, right ear: Secondary | ICD-10-CM | POA: Diagnosis not present

## 2014-07-06 DIAGNOSIS — E039 Hypothyroidism, unspecified: Secondary | ICD-10-CM | POA: Diagnosis not present

## 2014-07-06 DIAGNOSIS — C099 Malignant neoplasm of tonsil, unspecified: Secondary | ICD-10-CM

## 2014-07-06 DIAGNOSIS — I10 Essential (primary) hypertension: Secondary | ICD-10-CM | POA: Insufficient documentation

## 2014-07-06 DIAGNOSIS — G629 Polyneuropathy, unspecified: Secondary | ICD-10-CM | POA: Insufficient documentation

## 2014-07-06 DIAGNOSIS — Z85819 Personal history of malignant neoplasm of unspecified site of lip, oral cavity, and pharynx: Secondary | ICD-10-CM

## 2014-07-06 DIAGNOSIS — Z85818 Personal history of malignant neoplasm of other sites of lip, oral cavity, and pharynx: Secondary | ICD-10-CM | POA: Insufficient documentation

## 2014-07-06 DIAGNOSIS — Z9221 Personal history of antineoplastic chemotherapy: Secondary | ICD-10-CM | POA: Diagnosis not present

## 2014-07-06 DIAGNOSIS — R439 Unspecified disturbances of smell and taste: Secondary | ICD-10-CM | POA: Insufficient documentation

## 2014-07-06 DIAGNOSIS — Z08 Encounter for follow-up examination after completed treatment for malignant neoplasm: Secondary | ICD-10-CM | POA: Insufficient documentation

## 2014-07-06 DIAGNOSIS — Z931 Gastrostomy status: Secondary | ICD-10-CM | POA: Insufficient documentation

## 2014-07-06 LAB — COMPREHENSIVE METABOLIC PANEL
ALBUMIN: 4.3 g/dL (ref 3.5–5.2)
ALT: 9 U/L (ref 0–53)
ANION GAP: 12 (ref 5–15)
AST: 15 U/L (ref 0–37)
Alkaline Phosphatase: 60 U/L (ref 39–117)
BILIRUBIN TOTAL: 0.6 mg/dL (ref 0.3–1.2)
BUN: 13 mg/dL (ref 6–23)
CHLORIDE: 100 meq/L (ref 96–112)
CO2: 29 mEq/L (ref 19–32)
CREATININE: 1.34 mg/dL (ref 0.50–1.35)
Calcium: 9.5 mg/dL (ref 8.4–10.5)
GFR calc Af Amer: 58 mL/min — ABNORMAL LOW (ref >90)
GFR calc non Af Amer: 50 mL/min — ABNORMAL LOW (ref >90)
GLUCOSE: 104 mg/dL — AB (ref 70–99)
Potassium: 3.7 mEq/L (ref 3.7–5.3)
Sodium: 141 mEq/L (ref 137–147)
Total Protein: 7.9 g/dL (ref 6.0–8.3)

## 2014-07-06 LAB — CBC WITH DIFFERENTIAL/PLATELET
BASOS ABS: 0 10*3/uL (ref 0.0–0.1)
Basophils Relative: 0 % (ref 0–1)
EOS ABS: 0.2 10*3/uL (ref 0.0–0.7)
EOS PCT: 4 % (ref 0–5)
HCT: 40.1 % (ref 39.0–52.0)
Hemoglobin: 13.5 g/dL (ref 13.0–17.0)
Lymphocytes Relative: 21 % (ref 12–46)
Lymphs Abs: 1.2 10*3/uL (ref 0.7–4.0)
MCH: 31.3 pg (ref 26.0–34.0)
MCHC: 33.7 g/dL (ref 30.0–36.0)
MCV: 92.8 fL (ref 78.0–100.0)
Monocytes Absolute: 0.5 10*3/uL (ref 0.1–1.0)
Monocytes Relative: 9 % (ref 3–12)
Neutro Abs: 3.9 10*3/uL (ref 1.7–7.7)
Neutrophils Relative %: 66 % (ref 43–77)
PLATELETS: 232 10*3/uL (ref 150–400)
RBC: 4.32 MIL/uL (ref 4.22–5.81)
RDW: 13.3 % (ref 11.5–15.5)
WBC: 5.9 10*3/uL (ref 4.0–10.5)

## 2014-07-06 LAB — TSH: TSH: 11.72 u[IU]/mL — ABNORMAL HIGH (ref 0.350–4.500)

## 2014-07-06 LAB — LACTATE DEHYDROGENASE: LDH: 171 U/L (ref 94–250)

## 2014-07-06 NOTE — Patient Instructions (Signed)
[facility] Discharge Instructions  RECOMMENDATIONS MADE BY THE CONSULTANT AND ANY TEST RESULTS WILL BE SENT TO YOUR REFERRING PHYSICIAN.  Please make an appointment to followup with Dr. Veronda Prude in [place] for a referral to an ENT physician ASAP. Follow-up with Kirby Crigler at [facility] in 6 months with blood work. Call with any problems or concerns in the interim.   Thank you for choosing [facility] to provide your oncology and hematology care.  To afford each patient quality time with our providers, please arrive at least 15 minutes before your scheduled appointment time.  With your help, our goal is to use those 15 minutes to complete the necessary work-up to ensure our physicians have the information they need to help with your evaluation and healthcare recommendations.    Effective January 1st, 2014, we ask that you re-schedule your appointment with our physicians should you arrive 10 or more minutes late for your appointment.  We strive to give you quality time with our providers, and arriving late affects you and other patients whose appointments are after yours.    Again, thank you for choosing [facility].  Our hope is that these requests will decrease the amount of time that you wait before being seen by our physicians.       _____________________________________________________________  Should you have questions after your visit to [facility], please contact our office at [phone] between the hours of 8:30 a.m. and 5:00 p.m.  Voicemails left after 4:30 p.m. will not be returned until the following business day.  For prescription refill requests, have your pharmacy contact our office with your prescription refill request.

## 2014-07-06 NOTE — Progress Notes (Signed)
LABS FOR TSH,LDH,CMP,CBCD

## 2015-01-05 ENCOUNTER — Encounter (HOSPITAL_COMMUNITY): Payer: Federal, State, Local not specified - PPO | Attending: Oncology

## 2015-01-05 DIAGNOSIS — C760 Malignant neoplasm of head, face and neck: Secondary | ICD-10-CM | POA: Diagnosis not present

## 2015-01-05 DIAGNOSIS — Z85819 Personal history of malignant neoplasm of unspecified site of lip, oral cavity, and pharynx: Secondary | ICD-10-CM

## 2015-01-05 DIAGNOSIS — E039 Hypothyroidism, unspecified: Secondary | ICD-10-CM | POA: Insufficient documentation

## 2015-01-05 DIAGNOSIS — C099 Malignant neoplasm of tonsil, unspecified: Secondary | ICD-10-CM

## 2015-01-05 LAB — CBC WITH DIFFERENTIAL/PLATELET
Basophils Absolute: 0 10*3/uL (ref 0.0–0.1)
Basophils Relative: 0 % (ref 0–1)
Eosinophils Absolute: 0.2 10*3/uL (ref 0.0–0.7)
Eosinophils Relative: 3 % (ref 0–5)
HCT: 38.8 % — ABNORMAL LOW (ref 39.0–52.0)
Hemoglobin: 12.8 g/dL — ABNORMAL LOW (ref 13.0–17.0)
Lymphocytes Relative: 20 % (ref 12–46)
Lymphs Abs: 1.1 10*3/uL (ref 0.7–4.0)
MCH: 31.1 pg (ref 26.0–34.0)
MCHC: 33 g/dL (ref 30.0–36.0)
MCV: 94.4 fL (ref 78.0–100.0)
Monocytes Absolute: 0.6 10*3/uL (ref 0.1–1.0)
Monocytes Relative: 11 % (ref 3–12)
Neutro Abs: 3.7 10*3/uL (ref 1.7–7.7)
Neutrophils Relative %: 66 % (ref 43–77)
Platelets: 199 10*3/uL (ref 150–400)
RBC: 4.11 MIL/uL — ABNORMAL LOW (ref 4.22–5.81)
RDW: 13.5 % (ref 11.5–15.5)
WBC: 5.6 10*3/uL (ref 4.0–10.5)

## 2015-01-05 LAB — LACTATE DEHYDROGENASE: LDH: 116 U/L (ref 98–192)

## 2015-01-05 LAB — TSH: TSH: 11.441 u[IU]/mL — ABNORMAL HIGH (ref 0.350–4.500)

## 2015-01-05 NOTE — Progress Notes (Signed)
LABS DRAWN FOR LDH TSH,CBCD. PATIENT DID NOT WANT A REPEAT OF CMP WHICH WAS ON 12-17-2014. COPY OF THOSE RESULTS WERE GIVEN TO SUE B.

## 2015-01-08 ENCOUNTER — Ambulatory Visit (HOSPITAL_COMMUNITY): Payer: Self-pay | Admitting: Oncology

## 2015-01-08 ENCOUNTER — Encounter (HOSPITAL_BASED_OUTPATIENT_CLINIC_OR_DEPARTMENT_OTHER): Payer: Federal, State, Local not specified - PPO | Admitting: Hematology & Oncology

## 2015-01-08 ENCOUNTER — Encounter (HOSPITAL_COMMUNITY): Payer: Self-pay | Admitting: Hematology & Oncology

## 2015-01-08 ENCOUNTER — Other Ambulatory Visit (HOSPITAL_COMMUNITY): Payer: Self-pay

## 2015-01-08 VITALS — BP 138/67 | HR 58 | Temp 98.7°F | Resp 14 | Wt 157.0 lb

## 2015-01-08 DIAGNOSIS — E039 Hypothyroidism, unspecified: Secondary | ICD-10-CM

## 2015-01-08 DIAGNOSIS — C760 Malignant neoplasm of head, face and neck: Secondary | ICD-10-CM

## 2015-01-08 DIAGNOSIS — Z85819 Personal history of malignant neoplasm of unspecified site of lip, oral cavity, and pharynx: Secondary | ICD-10-CM | POA: Diagnosis not present

## 2015-01-08 MED ORDER — LEVOTHYROXINE SODIUM 112 MCG PO TABS: 112.0000 ug | ORAL_TABLET | Freq: Every day | ORAL | Status: DC

## 2015-01-08 NOTE — Patient Instructions (Signed)
[place] at [facility] Discharge Instructions  RECOMMENDATIONS MADE BY THE CONSULTANT AND ANY TEST RESULTS WILL BE SENT TO YOUR REFERRING PHYSICIAN.  Exam and discussion by Dr. Whitney Muse Need labs in 6 weeks and fax results to [place]. Call with any concerns.  Follow-up in 6 months.  Thank you for choosing [place] at [facility] to provide your oncology and hematology care.  To afford each patient quality time with our provider, please arrive at least 15 minutes before your scheduled appointment time.    You need to re-schedule your appointment should you arrive 10 or more minutes late.  We strive to give you quality time with our providers, and arriving late affects you and other patients whose appointments are after yours.  Also, if you no show three or more times for appointments you may be dismissed from the clinic at the providers discretion.     Again, thank you for choosing [facility].  Our hope is that these requests will decrease the amount of time that you wait before being seen by our physicians.       _____________________________________________________________  Should you have questions after your visit to [facility], please contact our office at [phone] between the hours of 8:30 a.m. and 4:30 p.m.  Voicemails left after 4:30 p.m. will not be returned until the following business day.  For prescription refill requests, have your pharmacy contact our office.

## 2015-01-08 NOTE — Progress Notes (Signed)
Sallee Lange, MD [address] [place] [address]  No diagnosis found.  CURRENT THERAPY: Surveillance per NCCN guidelines  INTERVAL HISTORY: Austin Mathis 74 y.o. 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   He is feeling well and is here alone today. He resides in [place] and comes to [place] to visit family. He walks three miles a day. He isn't having any issues with eating or swallowing. He never experienced dry mouth, not even during radiation treatment. He hasn't seen an ENT doctor since the cancer was treated. He hasn't seen a radiation doctor since treatment. He only sees [place] here and his primary care physician.  His TSH is elevatedl. He takes his thyroid medicine before breakfast daily on an empty stomach.     Primary squamous cell carcinoma of tonsil   11/03/2011 Initial Diagnosis Primary squamous cell carcinoma of tonsil; invasice carcinoma with strong p16 immunohistochemistry staining.   02/05/2012 - 03/18/2012 Chemotherapy Concomitant chemotherapy (Cisplatin weekly) with radiation therapy    06/18/2012 Remission PET scan is negative for any metabolic disease.    Past Medical History  Diagnosis Date  . High cholesterol   . Hearing deficit     Right greater than left.  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 Hypertension     EKG 5/13, chest CT 4/13 EPIC, clearance with OV 01/08/12 oncology T Keflas EPIC  . GERD (gastroesophageal reflux disease)   . Primary squamous cell carcinoma of tonsil 11/16/2011    last chemo 03/18/12; last XRT 03/22/12  . Radiation 02/05/12-03/22/12    7000 cGy 35 fx tonsil/neck  .  Pneumonia   . ED (erectile dysfunction)     has Primary squamous cell carcinoma of tonsil; Hypertension; Hypercholesterolemia; GERD (gastroesophageal reflux disease); Dysphagia, pharyngeal; Gastrostomy tube dependent; Acute renal failure; Pharyngitis; Pneumococcal pneumonia; and Hypothyroidism (acquired) on his problem list.     has No Known Allergies.  Mr. Aceituno had no medications administered during this visit.  Past Surgical History  Procedure Laterality Date  . Portacath placement  11/24/2011    left side  . Multiple extractions with alveoloplasty  01/10/2012    Procedure: MULTIPLE EXTRACION WITH ALVEOLOPLASTY;  Surgeon: Lenn Cal, DDS;  Location: [facility];  Service: Oral Surgery;  Laterality: N/A;  Extraction of tooth #'s 1,3,4,5,6,7,8,9,10,11,12,13,14,15,16,17,18,20,21,22,   . Esophagogastroduodenoscopy  01/16/2012    SLF: Mild gastritis  . Peg placement  01/16/2012    Procedure: PERCUTANEOUS ENDOSCOPIC GASTROSTOMY (PEG) PLACEMENT;  Surgeon: Danie Binder, MD;  Location: [facility];  Service: Endoscopy;  Laterality: N/A;  Ancef one gram IV before procedure  . Port-a-cath removal  07/08/2012    Procedure: REMOVAL PORT-A-CATH;  Surgeon: Scherry Ran, MD;  Location: [facility];  Service: General;  Laterality: N/A;  Removal of Port-A-Cath Left Subclavian  . Cataract extraction, bilateral Bilateral 10/2013  . Cataract  Bilateral   Non-smoker. He is a retired [employer] scientist.   denies any headaches, dizziness, double vision, fevers, chills, night sweats, nausea, vomiting, diarrhea, constipation, chest pain, heart palpitations, shortness of breath, blood in stool, black tarry stool, urinary pain, urinary burning, urinary frequency, hematuria. 14 point review of systems was performed  and is negative except as detailed under history of present illness and above    PHYSICAL EXAMINATION  ECOG PERFORMANCE STATUS: 0 - Asymptomatic  Filed Vitals:   01/08/15 1036  BP: 138/67  Pulse: 58    Temp: 98.7 F (37.1 C)  Resp: 14    GENERAL:alert, healthy, no distress and well nourished Dentures top and bottom. SKIN: skin color, texture, turgor are normal, no rashes or significant lesions HEAD: Normocephalic, No masses, lesions, tenderness or abnormalities. Neck thickening in the submandibular region, secondary to XRT. EYES: normal, PERRLA, EOMI, Conjunctiva are pink and non-injected, sclera clear EARS: External ears normal OROPHARYNX:no exudate and no erythema Upper dentures NECK: supple, no adenopathy, thyroid normal size, non-tender, without nodularity, no stridor, non-tender, trachea midline Bilateral neck radiation changes. LYMPH:  no palpable lymphadenopathy, no hepatosplenomegaly BREAST:not examined LUNGS: clear to auscultation and percussion HEART: regular rate & rhythm ABDOMEN:abdomen soft, non-tender, normal bowel sounds and no masses or organomegaly BACK: Back symmetric, no curvature. EXTREMITIES:no joint deformities, effusion, or inflammation, no edema  NEURO: alert & oriented x 3 with fluent speech, gait normal   LABORATORY DATA: CBC    Component Value Date/Time   WBC 5.6 01/05/2015 0948   RBC 4.11* 01/05/2015 0948   HGB 12.8* 01/05/2015 0948   HCT 38.8* 01/05/2015 0948   PLT 199 01/05/2015 0948   MCV 94.4 01/05/2015 0948   MCH 31.1 01/05/2015 0948   MCHC 33.0 01/05/2015 0948   RDW 13.5 01/05/2015 0948   LYMPHSABS 1.1 01/05/2015 0948   MONOABS 0.6 01/05/2015 0948   EOSABS 0.2 01/05/2015 0948   BASOSABS 0.0 01/05/2015 0948      Chemistry      Component Value Date/Time   NA 141 07/06/2014 0918   K 3.7 07/06/2014 0918   CL 100 07/06/2014 0918   CO2 29 07/06/2014 0918   BUN 13 07/06/2014 0918   CREATININE 1.34 07/06/2014 0918   CREATININE 1.28 11/27/2012 0855      Component Value Date/Time   CALCIUM 9.5 07/06/2014 0918   ALKPHOS 60 07/06/2014 0918   AST 15 07/06/2014 0918   ALT 9 07/06/2014 0918   BILITOT 0.6 07/06/2014 0918     Lab  Results  Component Value Date   TSH 11.441* 01/05/2015     ASSESSMENT:  1. 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 He is status post combined radiation and chemotherapy (02/05/2012- 03/18/2012) with complete remission. His PET scan on 06/18/2012 post therapy showed no evidence of persistence of disease.  2. Hypothyroidism, on replacement  3. Taste changes  4. Nearly resolved neuropathy. 5. Hearing deficit wearing a hearing aid in his right ear.  Patient Active Problem List   Diagnosis Date Noted  . Hypothyroidism (acquired) 01/08/2013  . Pneumococcal pneumonia 03/31/2012  . GERD (gastroesophageal reflux disease) 03/29/2012  . Dysphagia, pharyngeal 03/29/2012  . Gastrostomy tube dependent 03/29/2012  . Acute renal failure 03/29/2012  . Pharyngitis 03/29/2012  . 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 Hypertension 11/16/2011  . Hypercholesterolemia 11/16/2011    PLAN:   He is doing quite well without obvious recurrence. He is active. I will present his laboratory studies for him as he requested. I have also increased his Synthroid dosage and provided him with a new prescription. He states he can have his levels monitored by his primary care physician in [place].  I emphasized the  importance of seeing an ENT physician and he agrees to establish with one in [place]. He would like to continue to follow with [place] and therefore we will schedule him for a six-month follow-up. He will be back in the area for the holidays.   THERAPY PLAN:  NCCN guidelines for surveillance of Head and Neck cancer recommends:  A. H+P every 1-3 months for year 1  B. H+P every 2-6 months for year 2  C. H+P every 4-8 months for years 3-5  D. H+P every year for years greater than 5  E. Imaging only as clinically indicated following baseline imaging study within 6 months  of completion of therapy.   F.Complete head and neck exam; mirror and fiberoptic examination as clinically indicated.  G. TSH every 6-12 months.  H. Chest imaging as clinically indicated for patients with smoking history  I. Speech/hearing and swallowing evaluation and rehabilitation as clinically indicated.  J. Smoking cessation and EtOH counseling as clinically indicated.  K. Dental evaluation: recommended for oral cavity and sites exposed to significant intraoral radiation treatment.  L. Consider EBV DNA monitoring for nasopharyngeal cancer.    All questions were answered. The patient knows to call the clinic with any problems, questions or concerns. We can certainly see the patient much sooner if necessary.  This document serves as a record of services personally performed by Ancil Linsey, MD. It was created on her behalf by Arlyce Harman, a trained medical scribe. The creation of this record is based on the scribe's personal observations and the provider's statements to them. This document has been checked and approved by the attending provider.  I have reviewed the above documentation for accuracy and completeness, and I agree with the above. Molli Hazard, MD

## 2015-03-02 ENCOUNTER — Encounter (HOSPITAL_COMMUNITY): Payer: Self-pay | Admitting: Emergency Medicine

## 2015-03-02 NOTE — Progress Notes (Signed)
Notified pt of TSH level and to continue taking the same dose fo synthroid.  And to have levels checked in 4-6 weeks.  Verbalized understanding

## 2015-04-14 ENCOUNTER — Encounter: Payer: Self-pay | Admitting: Oncology

## 2015-05-13 ENCOUNTER — Other Ambulatory Visit (HOSPITAL_COMMUNITY): Payer: Self-pay

## 2015-05-13 MED ORDER — LEVOTHYROXINE SODIUM 112 MCG PO TABS: 112.0000 ug | ORAL_TABLET | Freq: Every day | ORAL | Status: DC

## 2015-07-09 ENCOUNTER — Encounter (HOSPITAL_COMMUNITY): Payer: Self-pay | Admitting: Hematology & Oncology

## 2015-07-09 ENCOUNTER — Encounter (HOSPITAL_COMMUNITY): Payer: Federal, State, Local not specified - PPO | Attending: Hematology & Oncology | Admitting: Hematology & Oncology

## 2015-07-09 VITALS — BP 140/73 | HR 65 | Temp 98.2°F | Resp 18 | Wt 157.6 lb

## 2015-07-09 DIAGNOSIS — C099 Malignant neoplasm of tonsil, unspecified: Secondary | ICD-10-CM | POA: Diagnosis present

## 2015-07-09 DIAGNOSIS — B977 Papillomavirus as the cause of diseases classified elsewhere: Secondary | ICD-10-CM | POA: Diagnosis not present

## 2015-07-09 DIAGNOSIS — E039 Hypothyroidism, unspecified: Secondary | ICD-10-CM | POA: Diagnosis not present

## 2015-07-09 LAB — TSH: TSH: 8.102 u[IU]/mL — AB (ref 0.350–4.500)

## 2015-07-09 LAB — T4, FREE
FREE T4: 1.19 ng/dL — AB (ref 0.61–1.12)
Free T4: 1.06 ng/dL (ref 0.61–1.12)

## 2015-07-09 NOTE — Progress Notes (Signed)
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   Austin Mathis returns to the cancer center alone today. He is not happy to be in the cold, and wants to go back to [place] as soon as possible.  In spite of initially being from [place], [place], and "shovelling snow in a T-shirt" when he was young, he says now he can't stand the cold.   He says he feels great. He remarks "The only side-effect I still have left," and gestures to his feet, indicating his neuropathy. He is still walking 3 miles a day and staying active.  He is still waiting to get in with an ENT in [place]. He notes that he understands it is important for follow-up to see one. If he has not established before his next visit to [facility] with family he will notify us and we can get him back in with Dr. Benjamine Mola.  He has had a flu shot, a shingles shot, and his pneumonia vaccine this year, "so I shouldn't need anything."  He would like his thyroid to be checked today on the way out.  He denies change in appetite. No difficulties with swallowing.     Primary squamous cell carcinoma of tonsil ([place])   11/03/2011 Initial Diagnosis Primary squamous cell carcinoma of tonsil; invasice carcinoma with strong p16 immunohistochemistry staining.   02/05/2012 - 03/18/2012 Chemotherapy Concomitant chemotherapy (Cisplatin weekly) with  radiation therapy    06/18/2012 Remission PET scan is negative for any metabolic disease.    Past Medical History  Diagnosis Date  . High cholesterol   . Hearing deficit     Right greater than left.  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 Hypertension     EKG 5/13, chest CT 4/13 EPIC, clearance with OV 01/08/12 oncology T Keflas EPIC  . GERD (gastroesophageal reflux disease)   . 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 Radiation 02/05/12-03/22/12    7000 cGy 35 fx tonsil/neck  . Pneumonia   . ED (erectile dysfunction)     has Primary squamous cell carcinoma of tonsil ([place]); Hypertension; Hypercholesterolemia; GERD (gastroesophageal reflux disease); Dysphagia, pharyngeal; Gastrostomy tube dependent ([place]); Acute renal failure ([place]); Pharyngitis; Pneumococcal pneumonia ([place]); and Hypothyroidism (acquired) on his problem list.     has No Known Allergies.  Mr. Etheridge had no medications administered during this visit.  Past Surgical History  Procedure Laterality Date  . Portacath placement  11/24/2011    left side  . Multiple extractions with alveoloplasty  01/10/2012    Procedure: MULTIPLE EXTRACION WITH ALVEOLOPLASTY;  Surgeon: Lenn Cal, DDS;  Location: [facility];  Service: Oral Surgery;  Laterality: N/A;  Extraction of tooth #'s 1,3,4,5,6,7,8,9,10,11,12,13,14,15,16,17,18,20,21,22,   . Esophagogastroduodenoscopy  01/16/2012    SLF: Mild gastritis  . Peg placement  01/16/2012    Procedure: PERCUTANEOUS ENDOSCOPIC GASTROSTOMY (PEG) PLACEMENT;  Surgeon: Danie Binder, MD;  [facility];  Service: Endoscopy;  Laterality: N/A;  Ancef one gram IV before procedure  . Port-a-cath removal  07/08/2012    Procedure: REMOVAL PORT-A-CATH;  Surgeon: Scherry Ran, MD;  Location: [facility];  Service: General;  Laterality: N/A;  Removal of Port-A-Cath Left Subclavian  . Cataract extraction, bilateral Bilateral 10/2013  .  Cataract  Bilateral    Non-smoker. He is a retired [employer] scientist.   Review of Systems Denies any headaches, dizziness, double vision, fevers, chills, night sweats, nausea, vomiting, diarrhea, constipation, chest pain, heart palpitations, shortness of breath, blood in stool, black tarry stool, urinary pain, urinary burning, urinary frequency, hematuria.  14 point review of systems was performed and is negative except as detailed under history of present illness and above    PHYSICAL EXAMINATION  ECOG PERFORMANCE STATUS: 0 - Asymptomatic  Filed Vitals:   07/09/15 1125  BP: 140/73  Pulse: 65  Temp: 98.2 F (36.8 C)  Resp: 18    GENERAL:alert, healthy, no distress and well nourished Dentures top and bottom. SKIN: skin color, texture, turgor are normal, no rashes or significant lesions HEAD: Normocephalic, No masses, lesions, tenderness or abnormalities. Neck thickening in the submandibular region, secondary to XRT. EYES: normal, PERRLA, EOMI, Conjunctiva are pink and non-injected, sclera clear EARS: External ears normal OROPHARYNX:no exudate and no erythema Upper dentures NECK: supple, no adenopathy, thyroid normal size, non-tender, without nodularity, no stridor, non-tender, trachea midline Bilateral neck radiation changes. LYMPH:  no palpable lymphadenopathy, no hepatosplenomegaly BREAST:not examined LUNGS: clear to auscultation and percussion HEART: regular rate & rhythm ABDOMEN:abdomen soft, non-tender, normal bowel sounds and no masses or organomegaly BACK: Back symmetric, no curvature. EXTREMITIES:no joint deformities, effusion, or inflammation, no edema  NEURO: alert & oriented x 3 with fluent speech, gait normal   LABORATORY DATA: I have reviewed the data as listed. CBC    Component Value Date/Time   WBC 5.6 01/05/2015 0948   RBC 4.11* 01/05/2015 0948   HGB 12.8* 01/05/2015 0948   HCT 38.8* 01/05/2015 0948   PLT 199 01/05/2015 0948   MCV 94.4 01/05/2015 0948   MCH  31.1 01/05/2015 0948   MCHC 33.0 01/05/2015 0948   RDW 13.5 01/05/2015 0948   LYMPHSABS 1.1 01/05/2015 0948   MONOABS 0.6 01/05/2015 0948   EOSABS 0.2 01/05/2015 0948   BASOSABS 0.0 01/05/2015 0948      Chemistry      Component Value Date/Time   NA 141 07/06/2014 0918   K 3.7 07/06/2014 0918   CL 100 07/06/2014 0918   CO2 29 07/06/2014 0918   BUN 13 07/06/2014 0918   CREATININE 1.34 07/06/2014 0918   CREATININE 1.28 11/27/2012 0855      Component Value Date/Time   CALCIUM 9.5 07/06/2014 0918   ALKPHOS 60 07/06/2014 0918   AST 15 07/06/2014 0918   ALT 9 07/06/2014 0918   BILITOT 0.6 07/06/2014 0918      TSH/FT4 pending today   ASSESSMENT:  1. 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 He is status post combined radiation and chemotherapy (02/05/2012- 03/18/2012) with complete remission. His PET scan on 06/18/2012 post therapy showed no evidence of persistence of disease.  2. Hypothyroidism, on replacement  3. Taste changes  4. Treatment related neuropathy. 5. Hearing deficit wearing a hearing aid in his right ear.  Patient Active Problem  List   Diagnosis Date Noted  . Hypothyroidism (acquired) 01/08/2013  . Pneumococcal pneumonia ([place]) 03/31/2012  . GERD (gastroesophageal reflux disease) 03/29/2012  . Dysphagia, pharyngeal 03/29/2012  . Gastrostomy tube dependent ([place]) 03/29/2012  . Acute renal failure ([place]) 03/29/2012  . Pharyngitis 03/29/2012  . Primary squamous cell carcinoma of tonsil ([place]) 11/16/2011  . Hypertension 11/16/2011  . Hypercholesterolemia 11/16/2011    PLAN:   He is doing quite well without obvious recurrence. He is active.I will notify him of his thyroid function when both his TSH and FT4 are available. I again emphasized the importance of seeing an ENT physician and he agrees to establish with one in [place]. He notes  that if he has not had a formal visit with the ENT he has been referred to in [place] before his return here, he will let us know. He can certainly re-establish with Dr. Benjamine Mola. He comes back to [place] often to see family. He would like to continue to follow with [place] and therefore we will schedule him for a six-month follow-up.   THERAPY PLAN:  NCCN guidelines for surveillance of Head and Neck cancer recommends:  A. H+P every 1-3 months for year 1  B. H+P every 2-6 months for year 2  C. H+P every 4-8 months for years 3-5  D. H+P every year for years greater than 5  E. Imaging only as clinically indicated following baseline imaging study within 6 months of completion of therapy.   F.Complete head and neck exam; mirror and fiberoptic examination as clinically indicated.  G. TSH every 6-12 months.  H. Chest imaging as clinically indicated for patients with smoking history  I. Speech/hearing and swallowing evaluation and rehabilitation as clinically indicated.  J. Smoking cessation and EtOH counseling as clinically indicated.  K. Dental evaluation: recommended for oral cavity and sites exposed to significant intraoral radiation treatment.  L. Consider EBV DNA monitoring for nasopharyngeal cancer.    Orders Placed This Encounter  Procedures  . TSH    Standing Status: Future     Number of Occurrences: 1     Standing Expiration Date: 07/08/2016  . T4, free   All questions were answered. The patient knows to call the clinic with any problems, questions or concerns. We can certainly see the patient much sooner if necessary.  This document serves as a record of services personally performed by Ancil Linsey, MD. It was created on her behalf by Toni Amend, a trained medical scribe. The creation of this record is based on the scribe's personal observations and the provider's statements to them. This document has been checked and approved by the attending provider.  I have reviewed the  above documentation for accuracy and completeness, and I agree with the above.  Molli Hazard, MD

## 2015-07-09 NOTE — Patient Instructions (Addendum)
[place] at [facility] Discharge Instructions  RECOMMENDATIONS MADE BY THE CONSULTANT AND ANY TEST RESULTS WILL BE SENT TO YOUR REFERRING PHYSICIAN.   Exam completed by Dr Whitney Muse today We can always try to get you an appt with Dr Benjamine Mola, please call us if you want [place] to try to get you an appt  You call for your summer appt The best thing ou can do for your neuropathy is to keep walking and stay active Please call the clinic if you have any questions or concerns     Thank you for choosing [place] at [facility] to provide your oncology and hematology care.  To afford each patient quality time with our provider, please arrive at least 15 minutes before your scheduled appointment time.    You need to re-schedule your appointment should you arrive 10 or more minutes late.  We strive to give you quality time with our providers, and arriving late affects you and other patients whose appointments are after yours.  Also, if you no show three or more times for appointments you may be dismissed from the clinic at the providers discretion.     Again, thank you for choosing [facility].  Our hope is that these requests will decrease the amount of time that you wait before being seen by our physicians.       _____________________________________________________________  Should you have questions after your visit to [facility], please contact our office at [phone] between the hours of 8:30 a.m. and 4:30 p.m.  Voicemails left after 4:30 p.m. will not be returned until the following business day.  For prescription refill requests, have your pharmacy contact our office.

## 2016-03-19 ENCOUNTER — Other Ambulatory Visit (HOSPITAL_COMMUNITY): Payer: Self-pay | Admitting: Oncology

## 2016-08-11 ENCOUNTER — Other Ambulatory Visit: Payer: Self-pay | Admitting: Nurse Practitioner

## 2016-12-21 ENCOUNTER — Other Ambulatory Visit (HOSPITAL_COMMUNITY): Payer: Self-pay | Admitting: Oncology

## 2017-09-11 ENCOUNTER — Other Ambulatory Visit (HOSPITAL_COMMUNITY): Payer: Self-pay | Admitting: *Deleted

## 2017-09-14 ENCOUNTER — Other Ambulatory Visit (HOSPITAL_COMMUNITY): Payer: Self-pay | Admitting: Oncology

## 2017-09-16 NOTE — Telephone Encounter (Signed)
He has not been seen at the cancer center since 06/2015. Please help get him scheduled if he is interested.  Otherwise, his PCP or endocrinologist will need to assume managing his thyroid function.   Mike Craze, NP [place] [phone]

## 2017-09-18 ENCOUNTER — Telehealth (HOSPITAL_COMMUNITY): Payer: Self-pay

## 2017-09-18 NOTE — Telephone Encounter (Signed)
Called and left patient message on number listed in chart, which is in [place]. Requested patient call back and let us know if he needs to be seen and who is refilling his Synthroid.

## 2017-09-19 NOTE — Telephone Encounter (Signed)
Patient states he gets his refills in [place] from a doctor down there. He does not need [place] to fill it. By the way, he says the weather is beautiful down there.

## 2017-09-19 NOTE — Telephone Encounter (Signed)
This encounter was created in error - please disregard.

## 2021-12-02 ENCOUNTER — Encounter: Payer: Self-pay | Admitting: Urology

## 2021-12-02 ENCOUNTER — Ambulatory Visit (INDEPENDENT_AMBULATORY_CARE_PROVIDER_SITE_OTHER): Payer: Medicare HMO | Admitting: Urology

## 2021-12-02 VITALS — BP 118/66 | HR 89

## 2021-12-02 DIAGNOSIS — R972 Elevated prostate specific antigen [PSA]: Secondary | ICD-10-CM

## 2021-12-02 LAB — URINALYSIS, ROUTINE W REFLEX MICROSCOPIC
Bilirubin, UA: NEGATIVE
Glucose, UA: NEGATIVE
Ketones, UA: NEGATIVE
Leukocytes,UA: NEGATIVE
Nitrite, UA: NEGATIVE
Protein,UA: NEGATIVE
RBC, UA: NEGATIVE
Specific Gravity, UA: 1.025 (ref 1.005–1.030)
Urobilinogen, Ur: 0.2 mg/dL (ref 0.2–1.0)
pH, UA: 5.5 (ref 5.0–7.5)

## 2021-12-02 NOTE — Progress Notes (Signed)
? ?12/02/2021 ?11:14 AM  ? ?Austin Mathis ?Apr 17, 1941 ?[id number] ? ?Referring provider: [name], PA-C ?[address] ?[place],  [place] [address] ? ?Elevated PSA ? ? ?HPI: ?Austin Mathis is an 80yo here for evaluation of elevated PSA. His PSA was 10.7. No prior PSAs. IPSS 14 QOL 2. Nocturia 4x. He stream is fair. No other complaints today. He has stage 4 throat cancer diagnosed 10 years ago.  ? ? ?PMH: ?Past Medical History:  ?Diagnosis Date  ? ED (erectile dysfunction)   ? GERD (gastroesophageal reflux disease)   ? Hearing deficit   ? Right greater than left.  ? Herpetic lesions of face 01/08/2012  ? High cholesterol   ? Hypertension   ? EKG 5/13, chest CT 4/13 EPIC, clearance with OV 01/08/12 oncology T Keflas EPIC  ? Pneumonia   ? Primary squamous cell carcinoma of tonsil ([place]) 11/16/2011  ? last chemo 03/18/12; last XRT 03/22/12  ? Radiation 02/05/12-03/22/12  ? 7000 cGy 35 fx tonsil/neck  ? ? ?Surgical History: ?Past Surgical History:  ?Procedure Laterality Date  ? cataract  Bilateral   ? CATARACT EXTRACTION, BILATERAL Bilateral 10/2013  ? ESOPHAGOGASTRODUODENOSCOPY  01/16/2012  ? SLF: Mild gastritis  ? MULTIPLE EXTRACTIONS WITH ALVEOLOPLASTY  01/10/2012  ? Procedure: MULTIPLE EXTRACION WITH ALVEOLOPLASTY;  Surgeon: Lenn Cal, DDS;  Location: [facility];  Service: Oral Surgery;  Laterality: N/A;  Extraction of tooth #'s 1,3,4,5,6,7,8,9,10,11,12,13,14,15,16,17,18,20,21,22, ?  ? PEG PLACEMENT  01/16/2012  ? Procedure: PERCUTANEOUS ENDOSCOPIC GASTROSTOMY (PEG) PLACEMENT;  Surgeon: Danie Binder, MD;  [facility];  Service: Endoscopy;  Laterality: N/A;  Ancef one gram IV before procedure  ? PORT-A-CATH REMOVAL  07/08/2012  ? Procedure: REMOVAL PORT-A-CATH;  Surgeon: Scherry Ran, MD;  Location: [facility];  Service: General;  Laterality: N/A;  Removal of Port-A-Cath Left Subclavian  ? PORTACATH PLACEMENT  11/24/2011  ? left side  ? ? ?Home Medications:  ?Allergies as of 12/02/2021   ?No Known Allergies ?  ? ?   ?Medication List  ?  ? ?  ? Accurate as of Dec 02, 2021 11:14 AM. If you have any questions, ask your nurse or doctor.  ?  ?  ? ?  ? ?levothyroxine 112 MCG tablet ?Commonly known as: SYNTHROID ?TAKE 1 TABLET BY MOUTH EVERY DAY BEFORE BREAKFAST ?  ?levothyroxine 125 MCG tablet ?Commonly known as: SYNTHROID ?Take 125 mcg by mouth daily. ?  ?pravastatin 40 MG tablet ?Commonly known as: PRAVACHOL ?TAKE 1 TABLET EVERY DAY ?  ?Prevnar 13 Susp injection ?Generic drug: pneumococcal 13-valent conjugate vaccine ?  ?verapamil 120 MG CR tablet ?Commonly known as: CALAN-SR ?Take 120 mg by mouth at bedtime. ?  ? ?  ? ? ?Allergies: No Known Allergies ? ?Family History: ?Family History  ?Problem Relation Age of Onset  ? Hypertension Mother   ? ? ?Social History:  reports that he has never smoked. He has never used smokeless tobacco. He reports that he does not drink alcohol and does not use drugs. ? ?ROS: ?All other review of systems were reviewed and are negative except what is noted above in HPI ? ?Physical Exam: ?BP 118/66   Pulse 89   ?Constitutional:  Alert and oriented, No acute distress. ?HEENT: [place] AT, moist mucus membranes.  Trachea midline, no masses. ?Cardiovascular: No clubbing, cyanosis, or edema. ?Respiratory: Normal respiratory effort, no increased work of breathing. ?GI: Abdomen is soft, nontender, nondistended, no abdominal masses ?GU: No CVA tenderness. Circumcised phallus. No masses/lesions  on penis, testis, scrotum. Prostate 50g smooth no nodules no induration.  ?Lymph: No cervical or inguinal lymphadenopathy. ?Skin: No rashes, bruises or suspicious lesions. ?Neurologic: Grossly intact, no focal deficits, moving all 4 extremities. ?Psychiatric: Normal mood and affect. ? ?Laboratory Data: ?Lab Results  ?Component Value Date  ? WBC 5.6 01/05/2015  ? HGB 12.8 (L) 01/05/2015  ? HCT 38.8 (L) 01/05/2015  ? MCV 94.4 01/05/2015  ? PLT 199 01/05/2015  ? ? ?Lab Results  ?Component Value Date  ? CREATININE 1.34 07/06/2014   ? ? ?No results found for: PSA ? ?No results found for: TESTOSTERONE ? ?No results found for: HGBA1C ? ?Urinalysis ?   ?Component Value Date/Time  ? [place] YELLOW 03/29/2012 0140  ? APPEARANCEUR CLEAR 03/29/2012 0140  ? LABSPEC 1.015 03/29/2012 0140  ? PHURINE 5.5 03/29/2012 0140  ? GLUCOSEU NEGATIVE 03/29/2012 0140  ? [place] NEGATIVE 03/29/2012 0140  ? [place] NEGATIVE 03/29/2012 0140  ? [place] NEGATIVE 03/29/2012 0140  ? [place] NEGATIVE 03/29/2012 0140  ? UROBILINOGEN 0.2 03/29/2012 0140  ? NITRITE NEGATIVE 03/29/2012 0140  ? LEUKOCYTESUR NEGATIVE 03/29/2012 0140  ? ? ?No results found for: LABMICR, [place], RBCUA, LABEPIT, MUCUS, BACTERIA ? ?Pertinent Imaging: ? ?No results found for this or any previous visit. ? ?No results found for this or any previous visit. ? ?No results found for this or any previous visit. ? ?No results found for this or any previous visit. ? ?No results found for this or any previous visit. ? ?No results found for this or any previous visit. ? ?No results found for this or any previous visit. ? ?No results found for this or any previous visit. ? ? ?Assessment & Plan:   ? ?1. Elevated PSA ?The patient and I talked about etiologies of elevated PSA.  We discussed the possible relationship between elevated PSA and prostate cancer, BPH, prostatitis, infection trauma and recent ejaculations. AFter discussing the workup and options we have elected to proceed with surveillance. RTC 3 months with PSA  If it remains elevated with a positive rising trend we will discuss prostate biopsy at his follow-up appointment.  ?- Urinalysis, Routine w reflex microscopic ? ? ?No follow-ups on file. ? ?Nicolette Bang, MD ? ?[facility] ?  ?

## 2021-12-02 NOTE — Patient Instructions (Signed)

## 2021-12-29 ENCOUNTER — Encounter (HOSPITAL_COMMUNITY): Payer: Self-pay | Admitting: Speech Pathology

## 2021-12-29 ENCOUNTER — Ambulatory Visit (HOSPITAL_COMMUNITY): Payer: Medicare HMO | Admitting: Speech Pathology

## 2021-12-29 DIAGNOSIS — R1312 Dysphagia, oropharyngeal phase: Secondary | ICD-10-CM | POA: Insufficient documentation

## 2021-12-29 NOTE — Therapy (Signed)
[place], [place], [address] Phone: [phone]   Fax:  [phone]  Speech Language Pathology Evaluation  Patient Details  Name: Austin Mathis MRN: [id number] Date of Birth: 1941/01/26 No data recorded  Encounter Date: 12/29/2021   End of Session - 12/29/21 1608     Visit Number 1    Number of Visits 1    Authorization Type Aetna Medicare    SLP Start Time [phone]    SLP Stop Time  1035    SLP Time Calculation (min) 40 min    Activity Tolerance Patient tolerated treatment well             Past Medical History:  Diagnosis Date   ED (erectile dysfunction)    GERD (gastroesophageal reflux disease)    Hearing deficit    Right greater than left.   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   last chemo 03/18/12; last XRT 03/22/12   Radiation 02/05/12-03/22/12   7000 cGy 35 fx tonsil/neck    Past Surgical History:  Procedure Laterality Date   cataract  Bilateral    CATARACT EXTRACTION, BILATERAL Bilateral 10/2013   ESOPHAGOGASTRODUODENOSCOPY  01/16/2012   SLF: Mild gastritis   MULTIPLE EXTRACTIONS WITH ALVEOLOPLASTY  01/10/2012   Procedure: MULTIPLE EXTRACION WITH ALVEOLOPLASTY;  Surgeon: Lenn Cal, DDS;  Location: [facility];  Service: Oral Surgery;  Laterality: N/A;  Extraction of tooth #'s 1,3,4,5,6,7,8,9,10,11,12,13,14,15,16,17,18,20,21,22,    PEG PLACEMENT  01/16/2012   Procedure: PERCUTANEOUS ENDOSCOPIC GASTROSTOMY (PEG) PLACEMENT;  Surgeon: Danie Binder, MD;  [facility]: [facility];  Service: Endoscopy;  Laterality: N/A;  Ancef one gram IV before procedure   PORT-A-CATH REMOVAL  07/08/2012   Procedure: REMOVAL PORT-A-CATH;  Surgeon: Scherry Ran, MD;  Location: [facility];  Service: General;  Laterality: N/A;   Removal of Port-A-Cath Left Subclavian   PORTACATH PLACEMENT  11/24/2011   left side    There were no vitals filed for this visit.   Subjective Assessment - 12/29/21 1549     Subjective "I have to puree everything now."    Patient is accompained by: Family member   Spouse, Roxanne   Currently in Pain? No/denies              Prior Functional Status - 12/29/21 1551       Prior Functional Status   Cognitive/Linguistic Baseline Within functional limits    Type of Home House     Lives With Spouse    Available Help at Discharge Family    Education retired Therapist, sports    Vocation Retired             General - 12/29/21 [place]   Date of Onset 12/01/21    HPI Austin Mathis is an 81 yo male who was referred by [name], PA-C ([facility]) for a clinical swallow evaluation due to dysphagia. 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 He was last  seen for MBSS 04/22/2012 with recommendation for D3 and honey-thickened liquids and allowance for Austin Mathis water protocol. Pt indicates that he has been drinking thin liquids, but coughs with water and coffee, and soft solids/purees for the past several years. He indicates increased coughing with liquids, difficulty swallowing corn, peas, and ice cream, steady weight of ~144 pounds, and no reports of pneumonia.    Type of Study Bedside Swallow Evaluation    Previous Swallow Assessment MBSS 04/22/2012 with DP (D3 and HTL and frazier water protocol    Diet Prior to this Study Dysphagia 2 (chopped);Dysphagia 1 (puree);Thin liquids    Temperature Spikes Noted No    Respiratory Status Room air    History of Recent Intubation  No    Behavior/Cognition Alert;Cooperative;Pleasant mood    Oral Cavity Assessment Excessive secretions     Oral Care Completed by SLP No    Oral Cavity - Dentition Dentures, top;Dentures, bottom    Vision Functional for self-feeding    Self-Feeding Abilities Able to feed self    Patient Positioning Upright in chair    Baseline Vocal Quality Wet    Volitional Cough Strong;Congested    Volitional Swallow Able to elicit              Oral Motor/Sensory Function - 12/29/21 1603       Oral Motor/Sensory Function   Overall Oral Motor/Sensory Function Moderate impairment    Facial ROM Within Functional Limits    Facial Symmetry Within Functional Limits    Facial Strength Within Functional Limits    Facial Sensation Within Functional Limits    Lingual ROM Reduced right;Reduced left    Lingual Strength Reduced    Velum Within Functional Limits    Mandible Within Functional Limits             Ice Chips - 12/29/21 1604       Ice Chips   Ice chips Not tested             Thin Liquid - 12/29/21 1604       Thin Liquid   Thin Liquid Impaired    Presentation Cup;Self Fed    Pharyngeal  Phase Impairments Suspected delayed Swallow;Cough - Immediate             Nectar thick liquid - 12/29/21 1607       Nectar Thick Liquid   Nectar Thick Liquid Impaired    Presentation Cup;Spoon;Self Fed    Pharyngeal Phase Impairments Suspected delayed Swallow;Cough - Delayed;Wet Vocal Quality             Honey Thick Liquid - 12/29/21 1607       Honey Thick Liquid   Honey Thick Liquid Not tested             Puree - 12/29/21 1607       Puree   Puree Within functional limits    Presentation Spoon             Solid - 12/29/21 1608       Solid   Solid Not tested                 SLP Education - 12/29/21 1551     Education Details Plan for MBSS    Person(s) Educated Patient;Spouse    Methods Explanation    Comprehension Verbalized  understanding                  Plan - 12/29/21 1609  Clinical Impression Statement Pt presents with signs of aspiration and reduced airway protection with significant coughing with liquid intake and also with reports of nasal regurgitation and globus sensation. Pt was given the Eating Assessment Tool (EAT-10) and scored a 33/40 indicating significant problems with swallowing. Pt last had an instrumental assessment (MBSS) in October 2013 with recommendation for D3 and honey-thickened liquids and allowance of the [employer]. Pt consumes soft solids and all liquids at home at this time with U/L dentures in place, medications crushed in pudding, and drinks ~3 Ensures a day (likes strawberry and vanilla). Pt specifically reports difficulty with corn, peas, ice cream, and coffee. Recommend MBSS (Modified Barium Swallow Study) to evaluate swallow function and provided recommendations as appropriate. SLP will request order for MBSS to be completed at [facility].    Treatment/Interventions Other (comment);Patient/family education;Compensatory strategies;Aspiration precaution training;SLP instruction and feedback   MBSS   Potential to Achieve Goals Poor    Potential Considerations Other (comment)   s/p radiation induced sequelae   Consulted and Agree with Plan of Care Patient             Patient will benefit from skilled therapeutic intervention in order to improve the following deficits and impairments:   Dysphagia, oropharyngeal phase    Problem List Patient Active Problem List   Diagnosis Date Noted   Hypothyroidism (acquired) 01/08/2013   Pneumococcal pneumonia ([place]) 03/31/2012   GERD (gastroesophageal reflux disease) 03/29/2012   Dysphagia, pharyngeal 03/29/2012   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

## 2022-01-11 ENCOUNTER — Other Ambulatory Visit (HOSPITAL_COMMUNITY): Payer: Self-pay | Admitting: Specialist

## 2022-01-11 DIAGNOSIS — Z85819 Personal history of malignant neoplasm of unspecified site of lip, oral cavity, and pharynx: Secondary | ICD-10-CM

## 2022-01-11 DIAGNOSIS — R1312 Dysphagia, oropharyngeal phase: Secondary | ICD-10-CM

## 2022-01-23 ENCOUNTER — Encounter (HOSPITAL_COMMUNITY): Payer: Self-pay | Admitting: Speech Pathology

## 2022-01-23 ENCOUNTER — Ambulatory Visit (HOSPITAL_COMMUNITY)
Admission: RE | Admit: 2022-01-23 | Discharge: 2022-01-23 | Disposition: A | Discharge status: Home / Self Care | Payer: Medicare HMO | Source: Ambulatory Visit

## 2022-01-23 ENCOUNTER — Ambulatory Visit (HOSPITAL_COMMUNITY): Payer: Medicare HMO | Admitting: Speech Pathology

## 2022-01-23 DIAGNOSIS — R1312 Dysphagia, oropharyngeal phase: Secondary | ICD-10-CM

## 2022-01-23 DIAGNOSIS — Z85819 Personal history of malignant neoplasm of unspecified site of lip, oral cavity, and pharynx: Secondary | ICD-10-CM | POA: Insufficient documentation

## 2022-01-23 NOTE — Therapy (Signed)
[place], [place], [address] Phone: [phone]   Fax:  [phone]  Modified Barium Swallow  Patient Details  Name: Austin Mathis MRN: [id number] Date of Birth: 1940-07-25 No data recorded  Encounter Date: 01/23/2022   End of Session - 01/23/22 1336     Visit Number 1    Number of Visits 4    Authorization Type Aetna Medicare    SLP Start Time 1150    SLP Stop Time  1230    SLP Time Calculation (min) 40 min    Activity Tolerance Patient tolerated treatment well             Past Medical History:  Diagnosis Date   ED (erectile dysfunction)    GERD (gastroesophageal reflux disease)    Hearing deficit    Right greater than left.   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   last chemo 03/18/12; last XRT 03/22/12   Radiation 02/05/12-03/22/12   7000 cGy 35 fx tonsil/neck    Past Surgical History:  Procedure Laterality Date   cataract  Bilateral    CATARACT EXTRACTION, BILATERAL Bilateral 10/2013   ESOPHAGOGASTRODUODENOSCOPY  01/16/2012   SLF: Mild gastritis   MULTIPLE EXTRACTIONS WITH ALVEOLOPLASTY  01/10/2012   Procedure: MULTIPLE EXTRACION WITH ALVEOLOPLASTY;  Surgeon: Lenn Cal, DDS;  Location: [facility];  Service: Oral Surgery;  Laterality: N/A;  Extraction of tooth #'s 1,3,4,5,6,7,8,9,10,11,12,13,14,15,16,17,18,20,21,22,    PEG PLACEMENT  01/16/2012   Procedure: PERCUTANEOUS ENDOSCOPIC GASTROSTOMY (PEG) PLACEMENT;  Surgeon: Danie Binder, MD;  [facility];  Service: Endoscopy;  Laterality: N/A;  Ancef one gram IV before procedure   PORT-A-CATH REMOVAL  07/08/2012   Procedure: REMOVAL PORT-A-CATH;  Surgeon: Scherry Ran, MD;  Location: [facility];  Service: General;  Laterality: N/A;  Removal of  Port-A-Cath Left Subclavian   PORTACATH PLACEMENT  11/24/2011   left side    There were no vitals filed for this visit.   Subjective Assessment - 01/23/22 1323     Subjective "The thickener seems to help."    Patient is accompained by: Family member    Special Tests MBSS    Currently in Pain? No/denies                 General - 01/23/22 1324       General Information   Date of Onset 12/01/21    HPI Austin Mathis is an 81 yo male who was referred by [name], PA-C ([facility]) for a clinical swallow evaluation due to dysphagia. 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 He was last seen for MBSS 04/22/2012 with recommendation for D3 and honey-thickened liquids and allowance for Austin Mathis water protocol. Pt indicates that he has been drinking thin liquids, but coughs with water and coffee, and soft solids/purees for the past several years. He indicates increased coughing with liquids, difficulty swallowing corn, peas, and ice cream, steady weight of ~144 pounds, and no reports of pneumonia.  BSE completed 12/29/2021 with recommendation for MBSS. Pt has since purchased thickener and reports that swallowing liquids is a little easier with this.    Type of Study MBS-Modified Barium Swallow Study    Previous Swallow Assessment MBSS 04/22/2012 with DP (D3 and HTL and frazier water protocol    Diet Prior to this Study Dysphagia 2 (chopped);Dysphagia 1 (puree);Thin liquids    Temperature Spikes Noted No    Respiratory Status Room air    History of Recent Intubation No    Behavior/Cognition Alert;Cooperative;Pleasant mood    Oral Cavity Assessment Excessive secretions    Oral Care  Completed by SLP No    Oral Cavity - Dentition Dentures, top;Dentures, bottom    Vision Functional for self feeding    Self-Feeding Abilities Able to feed self    Patient Positioning Upright in chair    Baseline Vocal Quality Normal;Other (comment)   hypernasal   Volitional Cough Strong;Congested    Volitional Swallow Able to elicit    Anatomy Within functional limits    Pharyngeal Secretions Not observed secondary MBS                Oral Preparation/Oral Phase - 01/23/22 1326       Oral Preparation/Oral Phase   Oral Phase Impaired      Oral - Honey   Oral - Honey Cup Lingual pumping;Weak ligual manipulation;Decreased lingual cupping;Decreased bolus cohesion;Decreased velo-pharyngeal closure      Oral - Nectar   Oral - Nectar Teaspoon Decreased lingual cupping;Weak ligual manipulation;Lingual pumping;Decreased bolus cohesion;Decreased velo-pharyngeal closure      Oral - Thin   Oral - Thin Teaspoon Decreased velo-pharyngeal closure;Decreased lingual cupping;Decreased bolus cohesion;Weak ligual manipulation;Lingual pumping    Oral - Thin Cup Decreased lingual cupping;Decreased bolus cohesion;Decreased velo-pharyngeal closure;Weak ligual manipulation;Lingual pumping      Oral - Solids   Oral - Puree Weak ligual manipulation;Oral residue;Decreased bolus cohesion      Electrical stimulation - Oral Phase   Was Electrical Stimulation Used No              Pharyngeal Phase - 01/23/22 1328       Pharyngeal Phase   Pharyngeal Phase Impaired      Pharyngeal - Honey   Pharyngeal- Honey Cup Swallow initiation at pyriform sinus;Reduced epiglottic inversion;Reduced anterior laryngeal mobility;Reduced laryngeal elevation;Reduced tongue base retraction;Reduced pharyngeal peristalsis;Penetration/Apiration after swallow;Trace aspiration;Pharyngeal residue - valleculae;Reduced airway/laryngeal closure    Pharyngeal Material enters airway, passes BELOW cords then ejected out       Pharyngeal - Nectar   Pharyngeal- Nectar Teaspoon Swallow initiation at pyriform sinus;Reduced pharyngeal peristalsis;Reduced epiglottic inversion;Reduced anterior laryngeal mobility;Reduced laryngeal elevation;Reduced airway/laryngeal closure;Reduced tongue base retraction;Pharyngeal residue - valleculae;Penetration/Aspiration during swallow;Penetration/Apiration after swallow;Trace aspiration    Pharyngeal Material enters airway, passes BELOW cords then ejected out      Pharyngeal - Thin   Pharyngeal- Thin Teaspoon Swallow initiation at vallecula;Reduced pharyngeal peristalsis;Reduced epiglottic inversion;Reduced anterior laryngeal mobility;Reduced laryngeal elevation;Reduced airway/laryngeal closure;Reduced tongue base retraction;Penetration/Aspiration during swallow;Penetration/Apiration after swallow;Trace aspiration;Pharyngeal residue - valleculae    Pharyngeal Material enters airway, passes BELOW cords then ejected out    Pharyngeal- Thin Cup Swallow initiation at pyriform sinus;Reduced pharyngeal peristalsis;Reduced epiglottic inversion;Reduced anterior laryngeal mobility;Reduced laryngeal elevation;Reduced airway/laryngeal closure;Reduced tongue base retraction;Penetration/Aspiration during swallow;Penetration/Apiration after swallow;Trace aspiration;Moderate aspiration;Pharyngeal residue - valleculae    Pharyngeal Material enters airway, passes BELOW cords and not ejected out despite cough attempt by patient      Pharyngeal - Solids   Pharyngeal- Puree Swallow initiation  at vallecula;Reduced pharyngeal peristalsis;Reduced epiglottic inversion;Reduced anterior laryngeal mobility;Reduced laryngeal elevation;Reduced tongue base retraction;Reduced airway/laryngeal closure;Penetration/Apiration after swallow;Trace aspiration;Pharyngeal residue - valleculae    Pharyngeal Material enters airway, passes BELOW cords then ejected out    Pharyngeal- Mechanical Soft Not tested      Electrical  Stimulation - Pharyngeal Phase   Was Electrical Stimulation Used No              Cricopharyngeal Phase - 01/23/22 1332       Cervical Esophageal Phase   Cervical Esophageal Phase Within functional limits                  SLP Short Term Goals - 01/23/22 1350       SLP SHORT TERM GOAL #1   Title Pt will complete oropharyngeal swallowing exercises as assigned 3x/day with use of written cues.    Baseline Not completing    Time 3    Period Weeks    Status New    Target Date 02/23/22                Plan - 01/23/22 1337     Clinical Impression Statement Pt presents with functionally severe oropharyngeal dysphagia in setting of post radiation sequelae, characterized by impaired lingual movement, impaired velopharyngeal closure, decreased tongue base retraction, decreased hyolaryngeal excursion, no epiglottic deflection, with overall impaired pharyngeal constriction, and reduced laryngeal closure resulting in premature spillage over the base of the tongue, nasal regurgitation, retrograde movement from the pharynx back to the nasopharynx, penetration/aspiration during and after the swallow with all textures and consistencies which was generally sensed with a strong cough, and significant vallecular residue with limited passage of po through the UES (best with HTL). Pt reports that his swallowing became worse ~ one year ago, however he has maintained his weight (144#), denies shortness of breath, and has not recently had pneumonia. Pt was only able to "squeeze down" small amounts of po and most remained in pharynx. Pt stated that he is NOT interested in any sort of feeding tubes for additional nutrition and he was encouraged to discuss this with his doctor. Given that Pt appears to have maintained his weight, no recent bouts of PNA, and declines feeding tubes, recommend continuing puree and honey-thickened liquids and trial period of dysphagia therapy to review previously given  swallowing exercises. Pt encouraged to continue with good oral care to decrease bacteria load, monitor temperature, and continue weight checks. Pt and spouse are in agreement with plan of care. Pt may wish to seek an evaluation from a prosthedontist to see if Pt may be a candidate for a palatal lift/obdurator. SLP will see Pt for 3 visits over the next three weeks.    Speech Therapy Frequency 1x /week    Duration --   3 weeks   Treatment/Interventions Aspiration precaution training;SLP instruction and feedback;Compensatory strategies;Patient/family education;Compensatory techniques    Potential to Achieve Goals Poor    Potential Considerations Previous level of function;Severity of impairments    Consulted and Agree with Plan of Care Patient;Family member/caregiver    Family Member Consulted Wife, Austin Mathis             Patient will benefit from skilled therapeutic intervention in order to improve the following deficits and impairments:   Oropharyngeal dysphagia     Recommendations/Treatment - 01/23/22 1332       Swallow Evaluation Recommendations   Recommended Consults Other (Comment)   Consider prosthodontic evaluation for possible palatal obdurator   SLP  Diet Recommendations Honey;Dysphagia 1 (puree)   Pt and wife confirm that Pt does not want PEG, encouraged to discuss with MD   Thickener user Simply thick    Liquid Administration via Cup    Medication Administration Crushed with puree    Supervision Patient able to self feed    Compensations Multiple dry swallows after each bite/sip;Clear throat after each swallow    Postural Changes Seated upright at 90 degrees;Remain upright for at least 30 minutes after feeds/meals              Prognosis - 01/23/22 1334       Prognosis   Prognosis for Safe Diet Advancement Guarded    Barriers to Reach Goals Time post onset;Severity of deficits    Barriers/Prognosis Comment Post radiation sequelae      Individuals Consulted    Consulted and Agree with Results and Recommendations Patient;Family member/caregiver    Family Member Consulted Wife, Austin Mathis    Report Sent to  Referring physician             Problem List Patient Active Problem List   Diagnosis Date Noted   Hypothyroidism (acquired) 01/08/2013   Pneumococcal pneumonia ([place]) 03/31/2012   GERD (gastroesophageal reflux disease) 03/29/2012   Dysphagia, pharyngeal 03/29/2012   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

## 2022-02-24 ENCOUNTER — Other Ambulatory Visit: Payer: Medicare HMO

## 2022-03-01 ENCOUNTER — Other Ambulatory Visit: Payer: Medicare HMO

## 2022-03-01 DIAGNOSIS — Z85819 Personal history of malignant neoplasm of unspecified site of lip, oral cavity, and pharynx: Secondary | ICD-10-CM

## 2022-03-01 DIAGNOSIS — R972 Elevated prostate specific antigen [PSA]: Secondary | ICD-10-CM

## 2022-03-01 DIAGNOSIS — R1312 Dysphagia, oropharyngeal phase: Secondary | ICD-10-CM

## 2022-03-02 LAB — PSA: Prostate Specific Ag, Serum: 11.7 ng/mL — ABNORMAL HIGH (ref 0.0–4.0)

## 2022-03-03 ENCOUNTER — Ambulatory Visit: Payer: Medicare HMO | Admitting: Urology

## 2022-03-07 ENCOUNTER — Ambulatory Visit: Payer: Medicare HMO | Admitting: Urology

## 2022-03-07 ENCOUNTER — Encounter: Payer: Self-pay | Admitting: Urology

## 2022-03-07 VITALS — BP 126/75 | HR 88

## 2022-03-07 DIAGNOSIS — R972 Elevated prostate specific antigen [PSA]: Secondary | ICD-10-CM

## 2022-03-07 MED ORDER — LEVOFLOXACIN 750 MG PO TABS: 750.0000 mg | ORAL_TABLET | Freq: Once | ORAL | 0 refills | Status: AC

## 2022-03-07 NOTE — Patient Instructions (Addendum)
Transrectal Ultrasound-Guided Prostate Biopsy An ultrasound is a test that uses sound waves to take pictures of the inside of the body. During this test, a small device (probe) with a gel is put inside your butt (rectum). The probe will take pictures of your prostate to help guide the needle. A needle will be inserted to take samples of tissues for testing. This test may be done to check for changes in the prostate, like prostate swelling or cancer. Tell your doctor about: Any allergies you have. All medicines you are taking. Any problems you or family members have had with anesthetic medicines. Any bleeding problems you have. Any surgeries you have had. Any medical conditions you have. Any prostate infections you have had. What are the risks? Infection. Bleeding from the butt. Blood in the pee (urine). Allergic reactions to medicines. Damage to nearby parts. Trouble peeing. Nerve damage. This is usually temporary. What happens before the test? Medicines Ask your doctor about changing or stopping: Your normal medicines. Vitamins, herbs, and supplements. Over-the-counter medicines. Do not take aspirin or ibuprofen unless you are told to. General instructions Follow instructions from your doctor about what you cannot eat or drink. Liquid will be used to clear waste from your butt (enema). You may have a blood sample taken. You may have a pee sample taken. For your safety, your doctor may: Ask you to wash with a soap that kills germs. Give you antibiotic medicine. If you will be going home right after the test, plan to have a responsible adult: Take you home from the hospital or clinic. You will not be allowed to drive. Care for you for the time you are told. What happens during the test?  An IV tube will be put into one of your veins. You will be given one or both of these: A medicine to help you relax. A medicine to numb the area. You will be placed on your left side. Your  knees will be bent toward your chest. A probe with gel on it will be placed in your butt. Pictures will be taken of your prostate and the area around it. Medicine will be used to numb your prostate. A needle will be placed in your butt and moved to your prostate. Prostate tissue will be taken out. The needle and probe will be taken out. The samples will be sent to a lab. The procedure may vary among doctors and hospitals. What happens after the test? You will be watched until you leave the hospital or clinic. This includes checking your blood pressure, heart rate, breathing rate, and blood oxygen level. You may have some pain in your butt. You will be given medicine for it. If you were given a sedative during the test, do not drive or use machines until your doctor says that it is safe. A sedative is a medicine that helps you relax. It is up to you to get the results of your test. Ask how to get your results when they are ready. Summary This test is usually done to check for prostate cancer. Before the test, ask your doctor about changing or stopping your medicines. You may have some pain in your butt. You will be given medicine for it. Plan to have a responsible adult take you home from the hospital or clinic. This information is not intended to replace advice given to you by your health care provider. Make sure you discuss any questions you have with your health care provider. Document Revised: 01/03/2021  Document Reviewed: 01/03/2021 Elsevier Patient Education  [place].       Appointment Time:12:15p Appointment Date:03/22/22  Location: [facility]   Prostate Biopsy Instructions  Stop all aspirin or blood thinners (aspirin, plavix, coumadin, warfarin, motrin, ibuprofen, advil, aleve, naproxen, naprosyn) for 7 days prior to the procedure.  If you have any questions about stopping these medications, please contact your primary care physician or  cardiologist.  Having a light meal prior to the procedure is recommended.  If you are diabetic or have low blood sugar please bring a small snack or glucose tablet.  A Fleets enema is needed to be purchased over the counter at a local pharmacy and used 2 hours before you scheduled appointment.  This can be purchased over the counter at any pharmacy.  Antibiotics will be administered in the clinic at the time of the procedure and 1 tablet has been sent to your pharmacy. Please take the antibiotic as prescribed.    Please bring someone with you to the procedure to drive you home if you are given a valium to take prior to your procedure.   If you have any questions or concerns, please feel free to call the office at [phone] or send a Mychart message.    Thank you, [facility]

## 2022-03-07 NOTE — Progress Notes (Signed)
03/07/2022 10:52 AM   [name] [id number]  Referring provider: Kathyrn Drown, MD [address] [place],  [place] [address]  Elevated PSA   HPI: Mr Poyser is a 81yo here for followup for elevated PSA> PSA increased to 11.7 from 10. No worsening LUTS. No other complaints today   PMH: Past Medical History:  Diagnosis Date   ED (erectile dysfunction)    GERD (gastroesophageal reflux disease)    Hearing deficit    Right greater than left.   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   last chemo 03/18/12; last XRT 03/22/12   Radiation 02/05/12-03/22/12   7000 cGy 35 fx tonsil/neck    Surgical History: Past Surgical History:  Procedure Laterality Date   cataract  Bilateral    CATARACT EXTRACTION, BILATERAL Bilateral 10/2013   ESOPHAGOGASTRODUODENOSCOPY  01/16/2012   SLF: Mild gastritis   MULTIPLE EXTRACTIONS WITH ALVEOLOPLASTY  01/10/2012   Procedure: MULTIPLE EXTRACION WITH ALVEOLOPLASTY;  Surgeon: Lenn Cal, DDS;  Location: [facility];  Service: Oral Surgery;  Laterality: N/A;  Extraction of tooth #'s 1,3,4,5,6,7,8,9,10,11,12,13,14,15,16,17,18,20,21,22,    PEG PLACEMENT  01/16/2012   Procedure: PERCUTANEOUS ENDOSCOPIC GASTROSTOMY (PEG) PLACEMENT;  Surgeon: Danie Binder, MD;  [facility]: [facility];  Service: Endoscopy;  Laterality: N/A;  Ancef one gram IV before procedure   PORT-A-CATH REMOVAL  07/08/2012   Procedure: REMOVAL PORT-A-CATH;  Surgeon: Scherry Ran, MD;  Location: [facility];  Service: General;  Laterality: N/A;  Removal of Port-A-Cath Left Subclavian   PORTACATH PLACEMENT  11/24/2011   left side    Home Medications:  Allergies as of 03/07/2022   No Known Allergies      Medication List        Accurate as of March 07, 2022 10:52 AM. If you  have any questions, ask your nurse or doctor.          levothyroxine 112 MCG tablet Commonly known as: SYNTHROID TAKE 1 TABLET BY MOUTH EVERY DAY BEFORE BREAKFAST   levothyroxine 125 MCG tablet Commonly known as: SYNTHROID Take 125 mcg by mouth daily.   pravastatin 40 MG tablet Commonly known as: PRAVACHOL TAKE 1 TABLET EVERY DAY   Prevnar 13 Susp injection Generic drug: pneumococcal 13-valent conjugate vaccine   verapamil 120 MG CR tablet Commonly known as: CALAN-SR Take 120 mg by mouth at bedtime.        Allergies: No Known Allergies  Family History: Family History  Problem Relation Age of Onset   Hypertension Mother     Social History:  reports that he has never smoked. He has never used smokeless tobacco. He reports that he does not drink alcohol and does not use drugs.  ROS: All other review of systems were reviewed and are negative except what is noted above in HPI  Physical Exam: BP 126/75   Pulse 88   Constitutional:  Alert and oriented, No acute distress. HEENT: [place] AT, moist mucus membranes.  Trachea midline, no masses. Cardiovascular: No clubbing, cyanosis, or edema. Respiratory: Normal respiratory effort, no increased work of breathing. GI: Abdomen is soft, nontender, nondistended, no abdominal masses GU: No CVA tenderness.  Lymph: No cervical or inguinal lymphadenopathy. Skin: No rashes, bruises or suspicious lesions. Neurologic: Grossly intact, no focal deficits, moving  all 4 extremities. Psychiatric: Normal mood and affect.  Laboratory Data: Lab Results  Component Value Date   WBC 5.6 01/05/2015   HGB 12.8 (L) 01/05/2015   HCT 38.8 (L) 01/05/2015   MCV 94.4 01/05/2015   PLT 199 01/05/2015    Lab Results  Component Value Date   CREATININE 1.34 07/06/2014    No results found for: "PSA"  No results found for: "TESTOSTERONE"  No results found for: "HGBA1C"  Urinalysis    Component Value Date/Time   COLORURINE YELLOW 03/29/2012  0140   APPEARANCEUR Clear 12/02/2021 1047   LABSPEC 1.015 03/29/2012 0140   PHURINE 5.5 03/29/2012 0140   GLUCOSEU Negative 12/02/2021 1047   HGBUR NEGATIVE 03/29/2012 0140   BILIRUBINUR Negative 12/02/2021 1047   KETONESUR NEGATIVE 03/29/2012 0140   PROTEINUR Negative 12/02/2021 1047   PROTEINUR NEGATIVE 03/29/2012 0140   UROBILINOGEN 0.2 03/29/2012 0140   NITRITE Negative 12/02/2021 1047   NITRITE NEGATIVE 03/29/2012 0140   LEUKOCYTESUR Negative 12/02/2021 1047    Lab Results  Component Value Date   LABMICR Comment 12/02/2021    Pertinent Imaging:  No results found for this or any previous visit.  No results found for this or any previous visit.  No results found for this or any previous visit.  No results found for this or any previous visit.  No results found for this or any previous visit.  No results found for this or any previous visit.  No results found for this or any previous visit.  No results found for this or any previous visit.   Assessment & Plan:    1. Elevated PSA The patient and I talked about etiologies of elevated PSA.  We discussed the possible relationship between elevated PSA, prostate cancer, BPH, prostatitis, and UTI.   Conservative treatment of elevated PSA with watchful waiting was discussed with the patient.  All questions were answered.        All of the risks and benefits along with alternatives to prostate biopsy were discussed with the patient.  The patient gave fully informed consent to proceed with a transrectal ultrasound guided biopsy of the prostate for the evaluation of their evated PSA.  Prostate biopsy instructions and antibiotics were given to the patient.  - Urinalysis, Routine w reflex microscopic   No follow-ups on file.  Nicolette Bang, MD  [facility] [place]

## 2022-03-09 LAB — MICROSCOPIC EXAMINATION: Bacteria, UA: NONE SEEN

## 2022-03-09 LAB — URINALYSIS, ROUTINE W REFLEX MICROSCOPIC
Bilirubin, UA: NEGATIVE
Glucose, UA: NEGATIVE
Nitrite, UA: NEGATIVE
RBC, UA: NEGATIVE
Specific Gravity, UA: 1.015 (ref 1.005–1.030)
Urobilinogen, Ur: 0.2 mg/dL (ref 0.2–1.0)
pH, UA: 6 (ref 5.0–7.5)

## 2022-03-22 ENCOUNTER — Ambulatory Visit (HOSPITAL_BASED_OUTPATIENT_CLINIC_OR_DEPARTMENT_OTHER): Payer: Medicare HMO | Admitting: Urology

## 2022-03-22 ENCOUNTER — Encounter (HOSPITAL_COMMUNITY): Payer: Self-pay

## 2022-03-22 ENCOUNTER — Other Ambulatory Visit: Payer: Self-pay | Admitting: Urology

## 2022-03-22 ENCOUNTER — Ambulatory Visit (HOSPITAL_COMMUNITY)
Admission: RE | Admit: 2022-03-22 | Discharge: 2022-03-22 | Disposition: A | Discharge status: Home / Self Care | Payer: Medicare HMO | Source: Ambulatory Visit | Attending: Urology | Admitting: Urology

## 2022-03-22 ENCOUNTER — Encounter: Payer: Self-pay | Admitting: Urology

## 2022-03-22 DIAGNOSIS — R972 Elevated prostate specific antigen [PSA]: Secondary | ICD-10-CM | POA: Insufficient documentation

## 2022-03-22 DIAGNOSIS — C61 Malignant neoplasm of prostate: Secondary | ICD-10-CM | POA: Diagnosis not present

## 2022-03-22 MED ORDER — LIDOCAINE HCL (PF) 2 % IJ SOLN
INTRAMUSCULAR | Status: AC
  Administered 2022-03-22: 10 mL
  Filled 2022-03-22: qty 10

## 2022-03-22 MED ORDER — GENTAMICIN SULFATE 40 MG/ML IJ SOLN: 80.0000 mg | Freq: Once | INTRAMUSCULAR | Status: AC

## 2022-03-22 MED ORDER — LIDOCAINE HCL (PF) 2 % IJ SOLN: 10.0000 mL | Freq: Once | INTRAMUSCULAR | Status: AC

## 2022-03-22 MED ORDER — GENTAMICIN SULFATE 40 MG/ML IJ SOLN
INTRAMUSCULAR | Status: AC
  Administered 2022-03-22: 80 mg via INTRAMUSCULAR
  Filled 2022-03-22: qty 2

## 2022-03-22 NOTE — Progress Notes (Signed)
Prostate Biopsy Procedure   Informed consent was obtained after discussing risks/benefits of the procedure.  A time out was performed to ensure correct patient identity.  Pre-Procedure: - Last PSA Level: No results found for: "PSA" - Gentamicin given prophylactically - Levaquin 500 mg administered PO -Transrectal Ultrasound performed revealing a 59.9 gm prostate -No significant hypoechoic or median lobe noted  Procedure: - Prostate block performed using 10 cc 1% lidocaine and biopsies taken from sextant areas, a total of 12 under ultrasound guidance.  Post-Procedure: - Patient tolerated the procedure well - He was counseled to seek immediate medical attention if experiences any severe pain, significant bleeding, or fevers - Return in one week to discuss biopsy results

## 2022-03-22 NOTE — Progress Notes (Signed)
PT tolerated prostate biopsy procedure and antibiotic injection well today. Labs obtained and sent for pathology. PT ambulatory at discharge with no acute distress noted and verbalized understanding of discharge instructions. PT to follow up with urologist as scheduled on 04/03/22 @ 3:40 pm.

## 2022-03-22 NOTE — Patient Instructions (Signed)
Transrectal Ultrasound-Guided Prostate Biopsy, Care After What can I expect after the procedure? After the procedure, it is common to have: Pain and discomfort near your butt (rectum), especially while sitting. Pink-colored pee (urine). This is due to small amounts of blood in your pee. A burning feeling while peeing. Blood in your poop (stool). Bleeding from your butt. Blood in your semen. Follow these instructions at home: Medicines Take over-the-counter and prescription medicines only as told by your doctor. If you were given a sedative during your procedure, do not drive or use machines until your doctor says that it is safe. A sedative is a medicine that helps you relax. If you were prescribed an antibiotic medicine, take it as told by your doctor. Do not stop taking it even if you start to feel better. Activity  Return to your normal activities when your doctor says that it is safe. Ask your doctor when it is okay for you to have sex. You may have to avoid lifting. Ask your doctor how much you can safely lift. General instructions  Drink enough water to keep your pee pale yellow. Watch your pee, poop, and semen for new bleeding or bleeding that gets worse. Keep all follow-up visits. Contact a doctor if: You have any of these: Blood clots in your pee or poop. Blood in your pee more than 2 weeks after the procedure. Blood in your semen more than 2 months after the procedure. New or worse bleeding in your pee, poop, or semen. Very bad belly pain. Your pee smells bad or unusual. You have trouble peeing. Your lower belly feels firm. You have problems getting an erection. You feel like you may vomit (are nauseous), or you vomit. Get help right away if: You have a fever or chills. You have bright red pee. You have very bad pain that does not get better with medicine. You cannot pee. Summary After this procedure, it is common to have pain and discomfort near your butt,  especially while sitting. You may have blood in your pee and poop. It is common to have blood in your semen. Get help right away if you have a fever or chills. This information is not intended to replace advice given to you by your health care provider. Make sure you discuss any questions you have with your health care provider. Document Revised: 01/03/2021 Document Reviewed: 01/03/2021 Elsevier Patient Education  2023 Elsevier Inc.  

## 2022-04-03 ENCOUNTER — Encounter: Payer: Self-pay | Admitting: Urology

## 2022-04-03 ENCOUNTER — Ambulatory Visit: Payer: Medicare HMO | Admitting: Urology

## 2022-04-03 VITALS — BP 124/75 | HR 86

## 2022-04-03 DIAGNOSIS — C61 Malignant neoplasm of prostate: Secondary | ICD-10-CM | POA: Diagnosis not present

## 2022-04-03 NOTE — Progress Notes (Signed)
04/03/2022 4:34 PM   Austin Mathis 06/11/1941 [id number]  Referring provider: Kathyrn Drown, MD [address] [place],  [place] [address]  Followup prostate biopsy   HPI: [name] is a 81yo here for followup after prostate biopsy. 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 PSA 11.7.    PMH: Past Medical History:  Diagnosis Date   ED (erectile dysfunction)    GERD (gastroesophageal reflux disease)    Hearing deficit    Right greater than left.   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   last chemo 03/18/12; last XRT 03/22/12   Radiation 02/05/12-03/22/12   7000 cGy 35 fx tonsil/neck    Surgical History: Past Surgical History:  Procedure Laterality Date   cataract  Bilateral    CATARACT EXTRACTION, BILATERAL Bilateral 10/2013   ESOPHAGOGASTRODUODENOSCOPY  01/16/2012   SLF: Mild gastritis   MULTIPLE EXTRACTIONS WITH ALVEOLOPLASTY  01/10/2012   Procedure: MULTIPLE EXTRACION WITH ALVEOLOPLASTY;  Surgeon: Lenn Cal, DDS;  Location: [facility];  Service: Oral Surgery;  Laterality: N/A;  Extraction of tooth #'s 1,3,4,5,6,7,8,9,10,11,12,13,14,15,16,17,18,20,21,22,    PEG PLACEMENT  01/16/2012   Procedure: PERCUTANEOUS ENDOSCOPIC GASTROSTOMY (PEG) PLACEMENT;  Surgeon: Danie Binder, MD;  [facility];  Service: Endoscopy;  Laterality: N/A;  Ancef one gram IV before procedure   PORT-A-CATH REMOVAL  07/08/2012   Procedure: REMOVAL PORT-A-CATH;  Surgeon: Scherry Ran, MD;  Location: [facility];  Service: General;  Laterality: N/A;  Removal of Port-A-Cath Left Subclavian   PORTACATH PLACEMENT  11/24/2011   left side    Home Medications:  Allergies as of 04/03/2022   No Known Allergies      Medication List         Accurate as of April 03, 2022  4:34 PM. If you have any questions, ask your nurse or doctor.          levothyroxine 112 MCG tablet Commonly known as: SYNTHROID TAKE 1 TABLET BY MOUTH EVERY DAY BEFORE BREAKFAST   levothyroxine 125 MCG tablet Commonly known as: SYNTHROID Take 125 mcg by mouth daily.   pravastatin 40 MG tablet Commonly known as: PRAVACHOL TAKE 1 TABLET EVERY DAY   Prevnar 13 Susp injection Generic drug: pneumococcal 13-valent conjugate vaccine   verapamil 120 MG CR tablet Commonly known as: CALAN-SR Take 120 mg by mouth at bedtime.        Allergies: No Known Allergies  Family History: Family History  Problem Relation Age of Onset   Hypertension Mother     Social History:  reports that he has never smoked. He has never used smokeless tobacco. He reports that he does not drink alcohol and does not use drugs.  ROS: All other review of systems were reviewed and are negative except what is noted above in HPI  Physical Exam: BP 124/75   Pulse 86   Constitutional:  Alert and oriented, No acute distress. HEENT: [place] AT, moist mucus membranes.  Trachea midline, no masses. Cardiovascular: No clubbing, cyanosis, or edema. Respiratory: Normal respiratory effort, no increased work of breathing. GI: Abdomen is soft, nontender, nondistended, no abdominal masses GU: No CVA tenderness.  Lymph: No cervical or inguinal lymphadenopathy. Skin: No rashes, bruises  or suspicious lesions. Neurologic: Grossly intact, no focal deficits, moving all 4 extremities. Psychiatric: Normal mood and affect.  Laboratory Data: Lab Results  Component Value Date   WBC 5.6 01/05/2015   HGB 12.8 (L) 01/05/2015   HCT 38.8 (L) 01/05/2015   MCV 94.4 01/05/2015   PLT 199 01/05/2015    Lab Results  Component Value Date   CREATININE 1.34 07/06/2014    No results found for: "PSA"  No results found for: "TESTOSTERONE"  No results found for: "HGBA1C"  Urinalysis     Component Value Date/Time   COLORURINE YELLOW 03/29/2012 0140   APPEARANCEUR Cloudy (A) 03/07/2022 1051   LABSPEC 1.015 03/29/2012 0140   PHURINE 5.5 03/29/2012 0140   GLUCOSEU Negative 03/07/2022 1051   HGBUR NEGATIVE 03/29/2012 0140   BILIRUBINUR Negative 03/07/2022 1051   KETONESUR NEGATIVE 03/29/2012 0140   PROTEINUR 1+ (A) 03/07/2022 1051   PROTEINUR NEGATIVE 03/29/2012 0140   UROBILINOGEN 0.2 03/29/2012 0140   NITRITE Negative 03/07/2022 1051   NITRITE NEGATIVE 03/29/2012 0140   LEUKOCYTESUR Trace (A) 03/07/2022 1051    Lab Results  Component Value Date   LABMICR See below: 03/07/2022   WBCUA 0-5 03/07/2022   LABEPIT 0-10 03/07/2022   MUCUS Present (A) 03/07/2022   BACTERIA None seen 03/07/2022    Pertinent Imaging:  No results found for this or any previous visit.  No results found for this or any previous visit.  No results found for this or any previous visit.  No results found for this or any previous visit.  No results found for this or any previous visit.  No results found for this or any previous visit.  No results found for this or any previous visit.  No results found for this or any previous visit.   Assessment & Plan:    1. Prostate cancer [facility]) I discussed the natural history of intermediate risk prostate cancer with the patient and the various treatment options including active surveillance, RALP, IMRT, brachytherapy, cryotherapy, HIFU and ADT. After discussing the options the patient elects for Surveillance versus IMRT. We will obtain CT and Bone scan    No follow-ups on file.  Nicolette Bang, MD  [facility] [place]

## 2022-04-11 ENCOUNTER — Encounter (HOSPITAL_COMMUNITY): Payer: Self-pay | Admitting: Speech Pathology

## 2022-04-11 ENCOUNTER — Ambulatory Visit (HOSPITAL_COMMUNITY): Payer: Medicare HMO | Admitting: Speech Pathology

## 2022-04-11 DIAGNOSIS — R1312 Dysphagia, oropharyngeal phase: Secondary | ICD-10-CM | POA: Diagnosis present

## 2022-04-11 NOTE — Therapy (Signed)
[place], [place], [address] Phone: [phone]   Fax:  [phone]  Speech Language Pathology Treatment  Patient Details  Name: Austin Mathis MRN: [id number] Date of Birth: 03/21/1941 No data recorded  Encounter Date: 04/11/2022   End of Session - 04/11/22 1729     Visit Number 2    Number of Visits 4    Authorization Type A[employer]na Medicare    SLP Start Time 0240    SLP Stop Time  1700    SLP Time Calculation (min) 50 min    Activity Tolerance Patient tolerated treatment well             Past Medical History:  Diagnosis Date   ED (erectile dysfunction)    GERD (gastroesophageal reflux disease)    Hearing deficit    Right greater than left.   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   last chemo 03/18/12; last XRT 03/22/12   Radiation 02/05/12-03/22/12   7000 cGy 35 fx tonsil/neck    Past Surgical History:  Procedure Laterality Date   cataract  Bilateral    CATARACT EXTRACTION, BILATERAL Bilateral 10/2013   ESOPHAGOGASTRODUODENOSCOPY  01/16/2012   SLF: Mild gastritis   MULTIPLE EXTRACTIONS WITH ALVEOLOPLASTY  01/10/2012   Procedure: MULTIPLE EXTRACION WITH ALVEOLOPLASTY;  Surgeon: Lenn Cal, DDS;  [facility]: [facility];  Service: Oral Surgery;  Laterality: N/A;  Extraction of tooth #'s 1,3,4,5,6,7,8,9,10,11,12,13,14,15,16,17,18,20,21,22,    PEG PLACEMENT  01/16/2012   Procedure: PERCUTANEOUS ENDOSCOPIC GASTROSTOMY (PEG) PLACEMENT;  Surgeon: Danie Binder, MD;  [facility];  Service: Endoscopy;  Laterality: N/A;  Ancef one gram IV before procedure   PORT-A-CATH REMOVAL  07/08/2012   Procedure: REMOVAL PORT-A-CATH;  Surgeon: Scherry Ran, MD;  Location: [facility];  Service: General;  Laterality: N/A;   Removal of Port-A-Cath Left Subclavian   PORTACATH PLACEMENT  11/24/2011   left side    There were no vitals filed for this visit.   Subjective Assessment - 04/11/22 1728     Subjective "Eating is not fun."    Currently in Pain? No/denies              SLP Short Term Goals - 04/11/22 1730       SLP SHORT TERM GOAL #1   Title Pt will complete oropharyngeal swallowing exercises as assigned 3x/day with use of written cues.    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, characterized by impaired lingual movement, impaired velopharyngeal closure, decreased tongue base retraction, decreased hyolaryngeal excursion, no epiglottic deflection, with overall impaired pharyngeal constriction, and reduced laryngeal closure resulting in premature spillage over the base of the tongue, nasal regurgitation, retrograde movement from the pharynx back to the nasopharynx, penetration/aspiration during and after the swallow with all textures and consistencies which was generally sensed with a strong cough, and significant vallecular residue with limited passage of po through the UES (best with HTL). Pt reports that his swallowing became worse ~ one year ago,  however he has maintained his weight (144#), denies shortness of breath, and has not recently had pneumonia. Pt was only able to "squeeze down" small amounts of po and most remained in pharynx. Pt stated that he is NOT interested in any sort of feeding tubes for additional nutrition and he was encouraged to discuss this with his doctor. Given that Pt appears to have maintained his weight, no recent bouts of PNA, and declines feeding tubes, recommend continuing puree and honey-thickened liquids and trial period of dysphagia therapy to review previously given swallowing  exercises. Pt encouraged to continue with good oral care to decrease bacteria load, monitor temperature, and continue weight checks. Pt and spouse are in agreement with plan of care. Pt may wish to seek an evaluation from a prosthedontist to see if Pt may be a candidate for a palatal lift/obdurator. SLP will see Pt for 3 visits over the next three weeks. >>   Plan - 04/11/22 1729     Clinical Impression Statement Pt did not return to clinic following MBSS in July because they lost the phone number to the clinic per wife. Pt reports no changes since MBSS. He has been using thickener for most of his beverages and thickens it to honey-thick consistency. He consumes Ensure Plus, pudding, muffins, yogurt, coffee, orange juice, crab legs, and soups on a daily basis and has maintained his weight of 144 pounds. Speech continues to be dysarthric and hyper-nasal. His tongue is coated and appears atrophied with reduced range of motion. Imaging from the MBSS was reviewed with Pt and spouse. Pt understands he is at risk for aspiration with all liquids, however he reports no bouts of PNA and endorses good oral care. Pt was instructed to complete lingual stretching/range of motion exercises and chin tuck against resistance at home. He was able to return demonstrate in session. Pt is unable to protrude tongue very far and it was difficult to hold with a wash cloth. Pt also encouraged to implement speech intelligibility strategies of increased loudness, reduce rate/spacing between words, and over-articulate. Pt plans to travel to [place] for several months, but will attend another session next week before he leaves.    Speech Therapy Frequency 1x /week    Duration 2 weeks   3 weeks   Treatment/Interventions Aspiration precaution training;SLP instruction and feedback;Compensatory strategies;Patient/family education;Compensatory techniques    Potential to Achieve Goals Poor    Potential Considerations Previous level of  function;Severity of impairments    Consulted and Agree with Plan of Care Patient;Family member/caregiver    Family Member Consulted Wife, Roxanne             Patient will benefit from skilled therapeutic intervention in order to improve the following deficits and impairments:   Oropharyngeal dysphagia    Problem List Patient Active Problem List   Diagnosis Date Noted   Hypothyroidism (acquired) 01/08/2013   Pneumococcal pneumonia ([place]) 03/31/2012   GERD (gastroesophageal reflux disease) 03/29/2012   Dysphagia, pharyngeal 03/29/2012   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

## 2022-04-17 ENCOUNTER — Encounter (HOSPITAL_COMMUNITY)
Admission: RE | Admit: 2022-04-17 | Discharge: 2022-04-17 | Disposition: A | Discharge status: Home / Self Care | Payer: Medicare HMO | Source: Ambulatory Visit | Attending: Urology | Admitting: Urology

## 2022-04-17 ENCOUNTER — Ambulatory Visit (HOSPITAL_COMMUNITY)
Admission: RE | Admit: 2022-04-17 | Discharge: 2022-04-17 | Disposition: A | Discharge status: Home / Self Care | Payer: Medicare HMO | Source: Ambulatory Visit | Attending: Urology | Admitting: Urology

## 2022-04-17 DIAGNOSIS — Z9221 Personal history of antineoplastic chemotherapy: Secondary | ICD-10-CM | POA: Diagnosis not present

## 2022-04-17 DIAGNOSIS — C61 Malignant neoplasm of prostate: Secondary | ICD-10-CM | POA: Insufficient documentation

## 2022-04-17 DIAGNOSIS — E119 Type 2 diabetes mellitus without complications: Secondary | ICD-10-CM | POA: Insufficient documentation

## 2022-04-17 DIAGNOSIS — Z8589 Personal history of malignant neoplasm of other organs and systems: Secondary | ICD-10-CM | POA: Insufficient documentation

## 2022-04-17 DIAGNOSIS — M898X9 Other specified disorders of bone, unspecified site: Secondary | ICD-10-CM | POA: Diagnosis not present

## 2022-04-17 DIAGNOSIS — Z923 Personal history of irradiation: Secondary | ICD-10-CM | POA: Insufficient documentation

## 2022-04-17 MED ORDER — TECHNETIUM TC 99M MEDRONATE IV KIT
20.0000 | PACK | Freq: Once | INTRAVENOUS | Status: AC | PRN
  Administered 2022-04-17: 20 via INTRAVENOUS

## 2022-04-17 NOTE — Progress Notes (Signed)
GU Location of Tumor / Histology: Prostate Ca  Dr. Alyson Ingles 04/03/2022 Biopsy revealed Gleason 4+3=7 in 1/12 cores, Gleason 3+4=7 in 1/12 cores, and Gleason 3+3=6 in 1/12 cores. PSA 11.7.     Past/Anticipated interventions by urology, if any: {:18581}  Past/Anticipated interventions by medical oncology, if any: {:18581}  Weight changes, if any: {:18581}  IPPS: SHIM:  Bowel/Bladder complaints, if any: {:18581}   Nausea/Vomiting, if any: {:18581}  Pain issues, if any:  {:18581}  SAFETY ISSUES: Prior radiation? XRT 7000 cGy 35 fx tonsil/neck on 02/05/2012-03/05/2012.  Chemotherapy last tx on 8/26/213. Pacemaker/ICD? {:18581} Possible current pregnancy? Male Is the patient on methotrexate? No  Current Complaints / other details:

## 2022-04-18 ENCOUNTER — Ambulatory Visit (HOSPITAL_COMMUNITY)
Admission: RE | Admit: 2022-04-18 | Discharge: 2022-04-18 | Disposition: A | Discharge status: Home / Self Care | Payer: Medicare HMO | Source: Ambulatory Visit | Attending: Urology | Admitting: Urology

## 2022-04-18 DIAGNOSIS — C61 Malignant neoplasm of prostate: Secondary | ICD-10-CM | POA: Insufficient documentation

## 2022-04-18 LAB — POCT I-STAT CREATININE: Creatinine, Ser: 1.3 mg/dL — ABNORMAL HIGH (ref 0.61–1.24)

## 2022-04-18 MED ORDER — SODIUM CHLORIDE (PF) 0.9 % IJ SOLN
INTRAMUSCULAR | Status: AC
  Filled 2022-04-18: qty 50

## 2022-04-18 MED ORDER — IOHEXOL 300 MG/ML  SOLN
100.0000 mL | Freq: Once | INTRAMUSCULAR | Status: AC | PRN
  Administered 2022-04-18: 100 mL via INTRAVENOUS

## 2022-04-19 ENCOUNTER — Ambulatory Visit (HOSPITAL_COMMUNITY): Payer: Medicare HMO | Admitting: Speech Pathology

## 2022-04-19 ENCOUNTER — Ambulatory Visit: Payer: Medicare HMO | Admitting: Urology

## 2022-04-19 ENCOUNTER — Encounter (HOSPITAL_COMMUNITY): Payer: Self-pay | Admitting: Speech Pathology

## 2022-04-19 ENCOUNTER — Encounter: Payer: Self-pay | Admitting: Urology

## 2022-04-19 VITALS — BP 124/82 | HR 85

## 2022-04-19 DIAGNOSIS — R1312 Dysphagia, oropharyngeal phase: Secondary | ICD-10-CM

## 2022-04-19 DIAGNOSIS — C61 Malignant neoplasm of prostate: Secondary | ICD-10-CM | POA: Diagnosis not present

## 2022-04-19 LAB — MICROSCOPIC EXAMINATION: Bacteria, UA: NONE SEEN

## 2022-04-19 LAB — URINALYSIS, ROUTINE W REFLEX MICROSCOPIC
Bilirubin, UA: NEGATIVE
Glucose, UA: NEGATIVE
Leukocytes,UA: NEGATIVE
Nitrite, UA: NEGATIVE
Specific Gravity, UA: 1.01 (ref 1.005–1.030)
Urobilinogen, Ur: 0.2 mg/dL (ref 0.2–1.0)
pH, UA: 6 (ref 5.0–7.5)

## 2022-04-19 NOTE — Progress Notes (Signed)
04/19/2022 11:06 AM   Austin Mathis 06-24-1941 [id number]  Referring provider: Kathyrn Drown, MD [place],  [place] [address]  Followup prostate cancer   HPI: Austin Mathis is a 81yo here for followup for unfavorable intermediate risk prostate cancer. He underwent CT and bone scan yesterday. Bone scan showed 1 area of abnormal uptake in his right scapula. He denies any shoulder/scapula pain. CT scan shows two left 87m pelvic lymph nodes. PSA 11.7.    PMH: Past Medical History:  Diagnosis Date   ED (erectile dysfunction)    GERD (gastroesophageal reflux disease)    Hearing deficit    Right greater than left.   Herpetic lesions of face 01/08/2012   High cholesterol    Hypertension    EKG 5/13, chest CT 4/13 EPIC, clearance with OV 01/08/12 oncology [facility] EPIC   Pneumonia    Primary squamous cell carcinoma of tonsil ([place] 11/16/2011   last chemo 03/18/12; last XRT 03/22/12   Radiation 02/05/12-03/22/12   7000 cGy 35 fx tonsil/neck    Surgical History: Past Surgical History:  Procedure Laterality Date   cataract  Bilateral    CATARACT EXTRACTION, BILATERAL Bilateral 10/2013   ESOPHAGOGASTRODUODENOSCOPY  01/16/2012   SLF: Mild gastritis   MULTIPLE EXTRACTIONS WITH ALVEOLOPLASTY  01/10/2012   Procedure: MULTIPLE EXTRACION WITH ALVEOLOPLASTY;  Surgeon: RLenn Cal DDS;  [facility];  Service: Oral Surgery;  Laterality: N/A;  Extraction of tooth #'s 1,3,4,5,6,7,8,9,10,11,12,13,14,15,16,17,18,20,21,22,    PEG PLACEMENT  01/16/2012   Procedure: PERCUTANEOUS ENDOSCOPIC GASTROSTOMY (PEG) PLACEMENT;  Surgeon: SDanie Binder MD;  [facility];  Service: Endoscopy;  Laterality: N/A;  Ancef one gram IV before procedure   PORT-A-CATH REMOVAL  07/08/2012   Procedure: REMOVAL PORT-A-CATH;  Surgeon: WScherry Ran MD;  Location: [facility];  Service: General;  Laterality: N/A;  Removal of Port-A-Cath Left Subclavian   PORTACATH PLACEMENT  11/24/2011    left side    Home Medications:  Allergies as of 04/19/2022   No Known Allergies      Medication List        Accurate as of April 19, 2022 11:06 AM. If you have any questions, ask your nurse or doctor.          levothyroxine 112 MCG tablet Commonly known as: SYNTHROID TAKE 1 TABLET BY MOUTH EVERY DAY BEFORE BREAKFAST   levothyroxine 125 MCG tablet Commonly known as: SYNTHROID Take 125 mcg by mouth daily.   pravastatin 40 MG tablet Commonly known as: PRAVACHOL TAKE 1 TABLET EVERY DAY   Prevnar 13 Susp injection Generic drug: pneumococcal 13-valent conjugate vaccine   verapamil 120 MG CR tablet Commonly known as: CALAN-SR Take 120 mg by mouth at bedtime.        Allergies: No Known Allergies  Family History: Family History  Problem Relation Age of Onset   Hypertension Mother     Social History:  reports that he has never smoked. He has never used smokeless tobacco. He reports that he does not drink alcohol and does not use drugs.  ROS: All other review of systems were reviewed and are negative except what is noted above in HPI  Physical Exam: BP 124/82   Pulse 85   Constitutional:  Alert and oriented, No acute distress. HEENT: [place] AT, moist mucus membranes.  Trachea midline, no masses. Cardiovascular: No clubbing, cyanosis, or edema. Respiratory: Normal respiratory effort, no increased work of breathing. GI: Abdomen is soft, nontender, nondistended, no  abdominal masses GU: No CVA tenderness.  Lymph: No cervical or inguinal lymphadenopathy. Skin: No rashes, bruises or suspicious lesions. Neurologic: Grossly intact, no focal deficits, moving all 4 extremities. Psychiatric: Normal mood and affect.  Laboratory Data: Lab Results  Component Value Date   WBC 5.6 01/05/2015   HGB 12.8 (L) 01/05/2015   HCT 38.8 (L) 01/05/2015   MCV 94.4 01/05/2015   PLT 199 01/05/2015    Lab Results  Component Value Date   CREATININE 1.30 (H) 04/18/2022    No  results found for: "PSA"  No results found for: "TESTOSTERONE"  No results found for: "HGBA1C"  Urinalysis    Component Value Date/Time   COLORURINE YELLOW 03/29/2012 0140   APPEARANCEUR Cloudy (A) 03/07/2022 1051   LABSPEC 1.015 03/29/2012 0140   PHURINE 5.5 03/29/2012 0140   GLUCOSEU Negative 03/07/2022 1051   HGBUR NEGATIVE 03/29/2012 0140   BILIRUBINUR Negative 03/07/2022 1051   KETONESUR NEGATIVE 03/29/2012 0140   PROTEINUR 1+ (A) 03/07/2022 1051   PROTEINUR NEGATIVE 03/29/2012 0140   UROBILINOGEN 0.2 03/29/2012 0140   NITRITE Negative 03/07/2022 1051   NITRITE NEGATIVE 03/29/2012 0140   LEUKOCYTESUR Trace (A) 03/07/2022 1051    Lab Results  Component Value Date   LABMICR See below: 03/07/2022   WBCUA 0-5 03/07/2022   LABEPIT 0-10 03/07/2022   MUCUS Present (A) 03/07/2022   BACTERIA None seen 03/07/2022    Pertinent Imaging: CT and bone scan yesterday: Images reviewed and discussed with the patient  No results found for this or any previous visit.  No results found for this or any previous visit.  No results found for this or any previous visit.  No results found for this or any previous visit.  No results found for this or any previous visit.  No valid procedures specified. No results found for this or any previous visit.  No results found for this or any previous visit.   Assessment & Plan:    1. Prostate cancer ([place]) -I discussed the natural history of unfavorable intermediate risk prostate cancer with the patient and the various treatment options including active surveillance, RALP, IMRT, brachytherapy, cryotherapy, HIFU and ADT. After discussing the options the patient elects for surveillance. RTC 6 months with PSA  - Urinalysis, Routine w reflex microscopic   No follow-ups on file.  Nicolette Bang, MD  [facility] [place]

## 2022-04-19 NOTE — Patient Instructions (Signed)

## 2022-04-19 NOTE — Therapy (Signed)
[place], [place], [address] Phone: [phone]   Fax:  [phone]  Speech Language Pathology Treatment  Patient Details  Name: Austin Mathis MRN: [id number] Date of Birth: 08-Jan-1941 No data recorded  Encounter Date: 04/19/2022   End of Session - 04/19/22 1027     Visit Number 3    Number of Visits 4    Authorization Type Aetna Medicare    SLP Start Time [phone]    SLP Stop Time  0955    SLP Time Calculation (min) 60 min    Activity Tolerance Patient tolerated treatment well             Past Medical History:  Diagnosis Date   ED (erectile dysfunction)    GERD (gastroesophageal reflux disease)    Hearing deficit    Right greater than left.   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   last chemo 03/18/12; last XRT 03/22/12   Radiation 02/05/12-03/22/12   7000 cGy 35 fx tonsil/neck    Past Surgical History:  Procedure Laterality Date   cataract  Bilateral    CATARACT EXTRACTION, BILATERAL Bilateral 10/2013   ESOPHAGOGASTRODUODENOSCOPY  01/16/2012   SLF: Mild gastritis   MULTIPLE EXTRACTIONS WITH ALVEOLOPLASTY  01/10/2012   Procedure: MULTIPLE EXTRACION WITH ALVEOLOPLASTY;  Surgeon: Lenn Cal, DDS;  [facility];  Service: Oral Surgery;  Laterality: N/A;  Extraction of tooth #'s 1,3,4,5,6,7,8,9,10,11,12,13,14,15,16,17,18,20,21,22,    PEG PLACEMENT  01/16/2012   Procedure: PERCUTANEOUS ENDOSCOPIC GASTROSTOMY (PEG) PLACEMENT;  Surgeon: Danie Binder, MD;  [facility];  Service: Endoscopy;  Laterality: N/A;  Ancef one gram IV before procedure   PORT-A-CATH REMOVAL  07/08/2012   Procedure: REMOVAL PORT-A-CATH;  Surgeon: Scherry Ran, MD;  Location[facility];  Service: General;  Laterality: N/A;   Removal of Port-A-Cath Left Subclavian   PORTACATH PLACEMENT  11/24/2011   left side    There were no vitals filed for this visit.     SLP Short Term Goals - 04/19/22 1033       SLP SHORT TERM GOAL #1   Title Pt will complete oropharyngeal swallowing exercises as assigned 3x/day with use of written cues.    Baseline Not completing    Time 3    Period Weeks    Status Achieved    Target Date 04/24/22                Plan - 04/19/22 1034     Clinical Impression Statement Pt accompanied to therapy by his wife, Roxanne. He continues to eat what he can by mouth (Ensure, pudding, yogurt, coffee, muffins). He is maintaining his weight of 144 pounds. Pt now reports that he would possibly be open to PEG, should he be unable to meet caloric needs by mouth, but is unwilling to do at this time. He continues to expectorate mucous constantly. SLP discussed ways to dine with friends on his cruise, while limiting excessive expectoration and coughing. He may consider drinking Ensure prior to meals with friends in the dining room and then just consuming small amounts when he is with others (and take some home for later). Pt does not appear bothered by his excessive expectoration, but also verbalizes that it may bother  other people. He continues to complete exercises and plans to do so going forward. Given that Pt appears to have maintained his weight, no recent bouts of PNA, and declines feeding tube at this time, recommend continuing puree and honey-thickened liquids. Pt encouraged to continue with good oral care to decrease bacteria load, monitor temperature, and continue weight checks. Pt will be going to [place] for the winter and will be discharged from SLP services at this time. He may wish to continue with therapy in [place] and/or when he returns for dysphagia therapy and dysarthria to address speech intelligibility deficits. Pt and spouse are in agreement with plan of care.   Duration --     Treatment/Interventions Aspiration precaution training;SLP instruction and feedback;Compensatory strategies;Patient/family education;Compensatory techniques    Potential to Achieve Goals Poor    Potential Considerations Previous level of function;Severity of impairments    Consulted and Agree with Plan of Care Patient;Family member/caregiver    Family Member Consulted Wife, Roxanne             Patient will benefit from skilled therapeutic intervention in order to improve the following deficits and impairments:   Oropharyngeal dysphagia    Problem List Patient Active Problem List   Diagnosis Date Noted   Hypothyroidism (acquired) 01/08/2013   Pneumococcal pneumonia ([place]) 03/31/2012   GERD (gastroesophageal reflux disease) 03/29/2012   Dysphagia, pharyngeal 03/29/2012   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 Goals met for completion of swallowing exercises.   Remaining deficits: Severe oropharyngeal dysphagia and mod/severe dysarthria   Education / Equipment: HEP assigned and Pt to continue going forward.   Patient agrees to discharge. Patient goals were met. Patient is being discharged due to  Pt going to [place] for the winter and being pleased with current level of function.   Thank you,  Genene Churn, [place]  Teddy Spike 04/19/2022, 10:34 AM  [place] [address] [place], [place], [address] Phone: [phone]   Fax:  [phone]   Name: Austin Mathis MRN: [id number] Date of Birth: 16-Mar-1941

## 2022-04-20 ENCOUNTER — Ambulatory Visit
Admission: RE | Admit: 2022-04-20 | Discharge: 2022-04-20 | Disposition: A | Discharge status: Home / Self Care | Payer: Medicare HMO | Source: Ambulatory Visit | Attending: Radiation Oncology | Admitting: Radiation Oncology

## 2022-04-20 ENCOUNTER — Encounter: Payer: Self-pay | Admitting: Urology

## 2022-04-20 ENCOUNTER — Other Ambulatory Visit: Payer: Self-pay

## 2022-04-20 VITALS — BP 110/77 | HR 81 | Temp 98.5°F | Resp 16 | Wt 138.4 lb

## 2022-04-20 DIAGNOSIS — Z9221 Personal history of antineoplastic chemotherapy: Secondary | ICD-10-CM | POA: Diagnosis not present

## 2022-04-20 DIAGNOSIS — Z923 Personal history of irradiation: Secondary | ICD-10-CM | POA: Insufficient documentation

## 2022-04-20 DIAGNOSIS — Z79899 Other long term (current) drug therapy: Secondary | ICD-10-CM | POA: Diagnosis not present

## 2022-04-20 DIAGNOSIS — N400 Enlarged prostate without lower urinary tract symptoms: Secondary | ICD-10-CM | POA: Diagnosis not present

## 2022-04-20 DIAGNOSIS — Z79890 Hormone replacement therapy (postmenopausal): Secondary | ICD-10-CM | POA: Diagnosis not present

## 2022-04-20 DIAGNOSIS — Z85818 Personal history of malignant neoplasm of other sites of lip, oral cavity, and pharynx: Secondary | ICD-10-CM | POA: Diagnosis not present

## 2022-04-20 DIAGNOSIS — C61 Malignant neoplasm of prostate: Secondary | ICD-10-CM | POA: Diagnosis present

## 2022-04-20 DIAGNOSIS — K219 Gastro-esophageal reflux disease without esophagitis: Secondary | ICD-10-CM | POA: Diagnosis not present

## 2022-04-20 DIAGNOSIS — E7800 Pure hypercholesterolemia, unspecified: Secondary | ICD-10-CM | POA: Insufficient documentation

## 2022-04-20 DIAGNOSIS — I10 Essential (primary) hypertension: Secondary | ICD-10-CM | POA: Insufficient documentation

## 2022-04-20 NOTE — Progress Notes (Signed)
Radiation Oncology         [phone] ________________________________  Initial Outpatient Consultation  Name: Austin Mathis MRN: [id number]  Date: 04/20/2022  DOB: 1941/01/01  CC:[name], PA-C  McKenzie, Candee Furbish, MD   REFERRING PHYSICIAN: Alyson Ingles Candee Furbish, MD  DIAGNOSIS: 81 y.o. 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    ICD-10-CM   1. Malignant neoplasm of prostate ([place])  C61       HISTORY OF PRESENT ILLNESS: Austin Mathis is a 81 y.o. male with a diagnosis of prostate cancer. He was noted to have an elevated PSA of 10.7 by his primary care provider, [name], PA-C.  Accordingly, he was referred for evaluation in urology by Dr. Alyson Ingles on 12/02/21,  digital rectal examination was performed at that time revealing no nodularity or concerning findings.  The patient proceeded to transrectal ultrasound with 12 biopsies of the prostate on 03/22/22.  The prostate volume measured 60 cc.  Out of 12 core biopsies, 3 were positive.  The maximum Gleason score was 4+3, and this was seen in the right mid. Additionally, there was 3+4 in the right base and 3+3 in the left base.  He had a Bone scan on 04/17/22 that showed an area of increased uptake in the inferior right scapula. A CT A/P on 04/18/22 was negative for evidence of osseous or visceral metastasis.  Of note, he has a remote history of tonsillar cancer treated with chemoradiation in 2013 and has been without recurrence since that time.  The patient reviewed the biopsy results with his urologist and he has kindly been referred today for discussion of potential radiation treatment options. He is accompanied by his wife, Austin Mathis, for today's visit.   PREVIOUS RADIATION THERAPY: Yes  02/05/2012 through 03/22/2012:  Tonsil/neck area was treated to 70 Gy in 35 fractions ([place])  PAST MEDICAL HISTORY:  Past Medical History:  Diagnosis Date   ED (erectile dysfunction)     GERD (gastroesophageal reflux disease)    Hearing deficit    Right greater than left.   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   last chemo 03/18/12; last XRT 03/22/12   Radiation 02/05/12-03/22/12   7000 cGy 35 fx tonsil/neck      PAST SURGICAL HISTORY: Past Surgical History:  Procedure Laterality Date   cataract  Bilateral    CATARACT EXTRACTION, BILATERAL Bilateral 10/2013   ESOPHAGOGASTRODUODENOSCOPY  01/16/2012   SLF: Mild gastritis   MULTIPLE EXTRACTIONS WITH ALVEOLOPLASTY  01/10/2012   Procedure: MULTIPLE EXTRACION WITH ALVEOLOPLASTY;  Surgeon: Lenn Cal, DDS;  Location: [facility];  Service: Oral Surgery;  Laterality: N/A;  Extraction of tooth #'s 1,3,4,5,6,7,8,9,10,11,12,13,14,15,16,17,18,20,21,22,    PEG PLACEMENT  01/16/2012   Procedure: PERCUTANEOUS ENDOSCOPIC GASTROSTOMY (PEG) PLACEMENT;  Surgeon: Danie Binder, MD;  [facility];  Service: Endoscopy;  Laterality: N/A;  Ancef one gram IV before procedure   PORT-A-CATH REMOVAL  07/08/2012   Procedure: REMOVAL PORT-A-CATH;  Surgeon: Scherry Ran, MD;  Location: [facility];  Service: General;  Laterality: N/A;  Removal of Port-A-Cath Left Subclavian   PORTACATH PLACEMENT  11/24/2011   left side    FAMILY HISTORY:  Family History  Problem Relation Age of Onset  Hypertension Mother     SOCIAL HISTORY:  Social History   Socioeconomic History   Marital status: Married    Spouse name: Not on file   Number of children: 7   Years of education: Not on file   Highest education level: Not on file  Occupational History   Occupation: Retired Optician, dispensing: RETIRED  Tobacco Use   Smoking status: Never   Smokeless tobacco: Never  Vaping Use   Vaping Use: Never used  Substance and Sexual Activity   Alcohol use: No     Comment: Rare use of alcohol.   Drug use: No   Sexual activity: Never  Other Topics Concern   Not on file  Social History Narrative   Patient is married with 7 children. Patient is a [place] but is planning on retiring to [place]. 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 Patient is a nonsmoker nondrinker.   Patient has never used smokeless tobacco.      Mother is alive and in her 35s. Father passed away at the age of 36.   Patient has other relatives that lived to be 100+.   Social Determinants of Health   Financial Resource Strain: Not on file  Food Insecurity: Not on file  Transportation Needs: Not on file  Physical Activity: Not on file  Stress: Not on file  Social Connections: Not on file  Intimate Partner Violence: Not on file    ALLERGIES: Patient has no known allergies.  MEDICATIONS:  Current Outpatient Medications  Medication Sig Dispense Refill   levothyroxine (SYNTHROID) 125 MCG tablet Take 125 mcg by mouth daily.     pravastatin (PRAVACHOL) 40 MG tablet TAKE 1 TABLET EVERY DAY 30 tablet 2   verapamil (CALAN-SR) 120 MG CR tablet Take 120 mg by mouth at bedtime.     No current facility-administered medications for this encounter.    REVIEW OF SYSTEMS:  On review of systems, the patient reports that he is doing well overall. He denies any chest pain, shortness of breath, cough, fevers, chills, night sweats, unintended weight changes. He denies any bowel disturbances, and denies abdominal pain, nausea or vomiting. He denies any new musculoskeletal or joint aches or pains. His IPSS was 8, indicating mild urinary symptoms with weak stream and nocturia x4/night. His SHIM was 21, indicating he does not have erectile dysfunction. A complete review of systems is obtained and is otherwise negative.    PHYSICAL EXAM:  Wt Readings from Last 3 Encounters:  04/20/22 138 lb 6 oz (62.8 kg)   07/09/15 157 lb 9.6 oz (71.5 kg)  01/08/15 157 lb (71.2 kg)   Temp Readings from Last 3 Encounters:  04/20/22 98.5 F (36.9 C) (Oral)  03/22/22 98.8 F (37.1 C) (Oral)  07/09/15 98.2 F (36.8 C) (Oral)   BP Readings from Last 3 Encounters:  04/20/22 110/77  04/19/22 124/82  04/03/22 124/75   Pulse Readings from Last 3 Encounters:  04/20/22 81  04/19/22 85  04/03/22 86   Pain Assessment Pain Score: 0-No pain/10  In general this is a well appearing caucasian male in no acute distress. He's alert and oriented x4 and appropriate throughout the examination. Cardiopulmonary assessment is negative for acute distress, and he exhibits normal effort.     KPS = 90  100 - Normal; no complaints; no evidence of disease. 90   - Able to carry on normal activity; minor signs or  symptoms of disease. 80   - Normal activity with effort; some signs or symptoms of disease. 58   - Cares for self; unable to carry on normal activity or to do active work. 60   - Requires occasional assistance, but is able to care for most of his personal needs. 50   - Requires considerable assistance and frequent medical care. 11   - Disabled; requires special care and assistance. 60   - Severely disabled; hospital admission is indicated although death not imminent. 87   - Very sick; hospital admission necessary; active supportive treatment necessary. 10   - Moribund; fatal processes progressing rapidly. 0     - Dead  Karnofsky DA, Abelmann [place], Craver LS and Burchenal JH [phone]) The use of the nitrogen mustards in the palliative treatment of carcinoma: with particular reference to bronchogenic carcinoma Cancer 1 634-56  LABORATORY DATA:  Lab Results  Component Value Date   WBC 5.6 01/05/2015   HGB 12.8 (L) 01/05/2015   HCT 38.8 (L) 01/05/2015   MCV 94.4 01/05/2015   PLT 199 01/05/2015   Lab Results  Component Value Date   NA 141 07/06/2014   K 3.7 07/06/2014   CL 100 07/06/2014   CO2 29 07/06/2014    Lab Results  Component Value Date   ALT 9 07/06/2014   AST 15 07/06/2014   ALKPHOS 60 07/06/2014   BILITOT 0.6 07/06/2014     RADIOGRAPHY: CT Abdomen Pelvis W Wo Contrast  Result Date: 04/20/2022 CLINICAL DATA:  Prostate cancer. Intermediate risk staging. * Tracking Code: BO * EXAM: CT ABDOMEN AND PELVIS WITHOUT AND WITH CONTRAST TECHNIQUE: Multidetector CT imaging of the abdomen and pelvis was performed following the standard protocol before and following the bolus administration of intravenous contrast. RADIATION DOSE REDUCTION: This exam was performed according to the departmental dose-optimization program which includes automated exposure control, adjustment of the mA and/or kV according to patient size and/or use of iterative reconstruction technique. CONTRAST:  164m OMNIPAQUE IOHEXOL 300 MG/ML  SOLN COMPARISON:  Exam medicine bone scan 04/17/2022 FINDINGS: Lower chest: Diffuse ground-glass density in the RIGHT middle lobe and RIGHT lower lobe. Bronchiectasis in the RIGHT lower lobe. Peripheral subpleural consolidation in the RIGHT lower lobe (image 27/6) Hepatobiliary: No focal hepatic lesion. No biliary duct dilatation. Common bile duct is normal. Pancreas: Pancreas is normal. No ductal dilatation. No pancreatic inflammation. Spleen: Normal spleen Adrenals/urinary tract: Adrenal glands and kidneys are normal. The ureters and bladder normal. Stomach/Bowel: The stomach, duodenum, and small bowel normal. Multiple diverticula of the descending colon and sigmoid colon without acute inflammation. Vascular/Lymphatic: Abdominal aorta is normal caliber. No periportal or retroperitoneal adenopathy. No pelvic adenopathy. Reproductive: Prostate LEFT gland is enlarged measuring 5.4 by 4.3 x 4.4 cm (volume = 53 cm^3). No pelvic lymphadenopathy Other: No free fluid. Musculoskeletal: No sclerotic lesion suggest prostate cancer metastasis degenerative changes in the lower lumbar spine. IMPRESSION: 1. No  evidence of prostate cancer metastasis. No pelvic lymphadenopathy. 2. Mild Prostatomegaly. 3. RIGHT lower lobe and RIGHT middle lobe airspace disease and bronchiectasis. Recommend correlation for acute or chronic pulmonary infection and consider pulmonology consultation. These results will be called to the ordering clinician or representative by the Radiologist Assistant, and communication documented in the PACS or [employer] Electronically Signed   By: SSuzy BouchardM.D.   On: 04/20/2022 09:47   NM Bone Scan Whole Body  Result Date: 04/18/2022 CLINICAL DATA:  Prostate cancer.  Staging. EXAM: NUCLEAR MEDICINE WHOLE BODY BONE SCAN  TECHNIQUE: Whole body anterior and posterior images were obtained approximately 3 hours after intravenous injection of radiopharmaceutical. RADIOPHARMACEUTICALS:  22.0. mCi Technetium-30mMDP IV COMPARISON:  Remote PET-CT 06/18/2022. FINDINGS: There is a medium size focus mild asymmetric activity localizing to the inferior right scapula. No additional abnormal asymmetric areas of increased uptake identified. Degenerative type changes are noted within both hips, sternoclavicular joints and both shoulders. Normal physiologic tracer activity is seen within the kidneys an urinary bladder. IMPRESSION: 1. Asymmetric focus of increased uptake localizes to the right inferior scapula. Indeterminate. Solitary bone metastases cannot be excluded with a high degree of certainty. Recommend correlation with PSA levels and plain film radiographs of the right scapula. 2. No additional abnormal areas to suggest osseous metastatic disease. 3. Degenerative changes as described above. Electronically Signed   By: TKerby MoorsM.D.   On: 04/18/2022 10:28   UKoreaGuided Needle Placement  Result Date: 03/22/2022 CLINICAL DATA:  Ultrasound was provided for use by the ordering physician.  No provider Interpretation or professional fees incurred.       IMPRESSION/PLAN: 1. 81y.o. gentleman with  Stage T1c adenocarcinoma of the prostate with Gleason Score of 4+3, and PSA of 11.7. We discussed the patient's workup and outlined the nature of prostate cancer in this setting. The patient's T stage, Gleason's score, and PSA put him into the unfavorable intermediate risk group. Accordingly, he is eligible for a variety of potential treatment options including brachytherapy, 5.5 weeks of external radiation, or prostatectomy. We discussed the available radiation techniques, and focused on the details and logistics of delivery. The patient is not an ideal candidate for brachytherapy due to his history of prior neck radiation for treatment of tonsillar cancer which would make intubation more difficult/risky. We discussed and outlined the risks, benefits, short and long-term effects associated with radiotherapy and compared and contrasted these with prostatectomy. We discussed the role of SpaceOAR gel in reducing the rectal toxicity associated with radiotherapy. He appears to have a good understanding of his disease and our treatment recommendations which are of curative intent.  He and his wife, RLupita Mathis were encouraged to ask questions that were answered to their stated satisfaction.  At the conclusion of our conversation, the patient is interested in moving forward with active surveillance so we will share our discussion with Dr. MAlyson Ingles [place] tentatively scheduled for follow up with Dr. MAlyson Inglesin April 2024 for repeat PSA and plan to make a decision regarding treatment at that time. They have our contact information and know that they are welcome to call at any time with any questions or concerns regarding radiation. We enjoyed meeting him and his wife today and look forward to following along in his care.  We personally spent 70 minutes in this encounter including chart review, reviewing radiological studies, meeting face-to-face with the patient,  entering orders and completing documentation.    ANicholos Johns PA-C    MTyler Pita MD  [facility] Direct Dial: [phone] Fax: [phone]  Skype  LinkedIn

## 2022-04-20 NOTE — Progress Notes (Signed)
Introduced myself to the patient as the prostate nurse navigator.  No barriers to care identified at this time.  He is here to discuss his radiation treatment options.  I gave him my business card and asked him to call me with questions or concerns.  Verbalized understanding.  ?

## 2022-11-21 ENCOUNTER — Other Ambulatory Visit: Payer: Medicare HMO

## 2022-11-21 DIAGNOSIS — C61 Malignant neoplasm of prostate: Secondary | ICD-10-CM

## 2022-11-22 ENCOUNTER — Encounter: Payer: Self-pay | Admitting: Urology

## 2022-11-22 ENCOUNTER — Ambulatory Visit: Payer: Medicare HMO | Admitting: Urology

## 2022-11-22 VITALS — BP 87/59 | HR 93

## 2022-11-22 DIAGNOSIS — C61 Malignant neoplasm of prostate: Secondary | ICD-10-CM | POA: Diagnosis not present

## 2022-11-22 LAB — PSA: Prostate Specific Ag, Serum: 13.7 ng/mL — ABNORMAL HIGH (ref 0.0–4.0)

## 2022-11-22 LAB — URINALYSIS, ROUTINE W REFLEX MICROSCOPIC
Bilirubin, UA: NEGATIVE
Glucose, UA: NEGATIVE
Ketones, UA: NEGATIVE
Leukocytes,UA: NEGATIVE
Nitrite, UA: NEGATIVE
Protein,UA: NEGATIVE
RBC, UA: NEGATIVE
Specific Gravity, UA: 1.015 (ref 1.005–1.030)
Urobilinogen, Ur: 0.2 mg/dL (ref 0.2–1.0)
pH, UA: 7 (ref 5.0–7.5)

## 2022-11-22 NOTE — Patient Instructions (Signed)

## 2022-11-22 NOTE — Progress Notes (Signed)
11/22/2022 10:25 AM   Austin Mathis 1940/11/10 [id number]  Referring provider: Wille Glaser, PA-C [id number] [place],  [place] [address]  Followup prostate cancer   HPI: Austin Mathis is a 81yo here for followup for unfavorable intermediate risk prostate cancer. PSA increased to 13.7 from 11.2. He was hospitalized with pneumonia for a week and now has a feeding tube. IPSS 14 QOL 3. No new bone pain.    PMH: Past Medical History:  Diagnosis Date   ED (erectile dysfunction)    GERD (gastroesophageal reflux disease)    Hearing deficit    Right greater than left.   Herpetic lesions of face 01/08/2012   High cholesterol    Hypertension    EKG 5/13, chest CT 4/13 EPIC, clearance with OV 01/08/12 oncology [facility] EP[facility]   Pneumonia    Primary squamous cell carcinoma of tonsil ([facility]) 11/16/2011   last chemo 03/18/12; last XRT 03/22/12   Radiation 02/05/12-03/22/12   7000 cGy 35 fx tonsil/neck    Surgical History: Past Surgical History:  Procedure Laterality Date   cataract  Bilateral    CATARACT EXTRACTION, BILATERAL Bilateral 10/2013   ESOPHAGOGASTRODUODENOSCOPY  01/16/2012   SLF: Mild gastritis   MULTIPLE EXTRACTIONS WITH ALVEOLOPLASTY  01/10/2012   Procedure: MULTIPLE EXTRACION WITH ALVEOLOPLASTY;  Surgeon: Charlynne Pander, DDS;  Location: [facility];  Service: Oral Surgery;  Laterality: N/A;  Extraction of tooth #'s 1,3,4,5,6,7,8,9,10,11,12,13,14,15,16,17,18,20,21,22,    PEG PLACEMENT  01/16/2012   Procedure: PERCUTANEOUS ENDOSCOPIC GASTROSTOMY (PEG) PLACEMENT;  Surgeon: West Bali, MD;  [facility];  Service: Endoscopy;  Laterality: N/A;  Ancef one gram IV before procedure   PORT-A-CATH REMOVAL  07/08/2012   Procedure: REMOVAL PORT-A-CATH;  Surgeon: Marlane Hatcher, MD;  Location: [facility];  Service: General;  Laterality: N/A;  Removal of Port-A-Cath Left Subclavian   PORTACATH PLACEMENT  11/24/2011   left side    Home Medications:  Allergies as of 11/22/2022   No Known  Allergies      Medication List        Accurate as of Nov 22, 2022 10:25 AM. If you have any questions, ask your nurse or doctor.          levothyroxine 125 MCG tablet Commonly known as: SYNTHROID Take 125 mcg by mouth daily.   pravastatin 40 MG tablet Commonly known as: PRAVACHOL TAKE 1 TABLET EVERY DAY   verapamil 120 MG CR tablet Commonly known as: CALAN-SR Take 120 mg by mouth at bedtime.        Allergies: No Known Allergies  Family History: Family History  Problem Relation Age of Onset   Hypertension Mother     Social History:  reports that he has never smoked. He has never used smokeless tobacco. He reports that he does not drink alcohol and does not use drugs.  ROS: All other review of systems were reviewed and are negative except what is noted above in HPI  Physical Exam: BP (!) 87/59   Pulse 93   Constitutional:  Alert and oriented, No acute distress. HEENT: Austin Mathis AT, moist mucus membranes.  Trachea midline, no masses. Cardiovascular: No clubbing, cyanosis, or edema. Respiratory: Normal respiratory effort, no increased work of breathing. GI: Abdomen is soft, nontender, nondistended, no abdominal masses GU: No CVA tenderness.  Lymph: No cervical or inguinal lymphadenopathy. Skin: No rashes, bruises or suspicious lesions. Neurologic: Grossly intact, no focal deficits, moving all 4 extremities. Psychiatric: Normal mood and affect.  Laboratory Data: Lab  Results  Component Value Date   WBC 5.6 01/05/2015   HGB 12.8 (L) 01/05/2015   HCT 38.8 (L) 01/05/2015   MCV 94.4 01/05/2015   PLT 199 01/05/2015    Lab Results  Component Value Date   CREATININE 1.30 (H) 04/18/2022    No results found for: "PSA"  No results found for: "TESTOSTERONE"  No results found for: "HGBA1C"  Urinalysis    Component Value Date/Time   COLORURINE YELLOW 03/29/2012 0140   APPEARANCEUR Clear 04/19/2022 1111   LABSPEC 1.015 03/29/2012 0140   PHURINE 5.5 03/29/2012  0140   GLUCOSEU Negative 04/19/2022 1111   HGBUR NEGATIVE 03/29/2012 0140   BILIRUBINUR Negative 04/19/2022 1111   KETONESUR NEGATIVE 03/29/2012 0140   PROTEINUR 1+ (A) 04/19/2022 1111   PROTEINUR NEGATIVE 03/29/2012 0140   UROBILINOGEN 0.2 03/29/2012 0140   NITRITE Negative 04/19/2022 1111   NITRITE NEGATIVE 03/29/2012 0140   LEUKOCYTESUR Negative 04/19/2022 1111    Lab Results  Component Value Date   LABMICR See below: 04/19/2022   WBCUA 0-5 04/19/2022   LABEPIT 0-10 04/19/2022   MUCUS Present (A) 03/07/2022   BACTERIA None seen 04/19/2022    Pertinent Imaging:  No results found for this or any previous visit.  No results found for this or any previous visit.  No results found for this or any previous visit.  No results found for this or any previous visit.  No results found for this or any previous visit.  No valid procedures specified. No results found for this or any previous visit.  No results found for this or any previous visit.   Assessment & Plan:    1. Prostate cancer ([facility]) Continue surverillance. Followup 6 months - Urinalysis, Routine w reflex microscopic   No follow-ups on file.  Wilkie Aye, MD  [facility] [place]

## 2023-01-22 DEATH — deceased

## 2023-04-25 ENCOUNTER — Other Ambulatory Visit: Payer: Medicare HMO

## 2023-05-02 ENCOUNTER — Ambulatory Visit: Payer: Medicare HMO | Admitting: Urology
